# Patient Record
Sex: Male | Born: 1940 | Race: White | Hispanic: No | Marital: Married | Smoking: Former smoker
Health system: Southern US, Community
[De-identification: ages and names within clinical notes are randomized; demographics above are authoritative.]

## PROBLEM LIST (undated history)

## (undated) DIAGNOSIS — K219 Gastro-esophageal reflux disease without esophagitis: Secondary | ICD-10-CM

## (undated) DIAGNOSIS — R011 Cardiac murmur, unspecified: Secondary | ICD-10-CM

## (undated) DIAGNOSIS — I679 Cerebrovascular disease, unspecified: Secondary | ICD-10-CM

## (undated) DIAGNOSIS — E785 Hyperlipidemia, unspecified: Secondary | ICD-10-CM

## (undated) DIAGNOSIS — I10 Essential (primary) hypertension: Secondary | ICD-10-CM

## (undated) DIAGNOSIS — M1990 Unspecified osteoarthritis, unspecified site: Secondary | ICD-10-CM

## (undated) DIAGNOSIS — I714 Abdominal aortic aneurysm, without rupture, unspecified: Secondary | ICD-10-CM

## (undated) DIAGNOSIS — C801 Malignant (primary) neoplasm, unspecified: Secondary | ICD-10-CM

## (undated) DIAGNOSIS — N183 Chronic kidney disease, stage 3 unspecified: Secondary | ICD-10-CM

## (undated) DIAGNOSIS — I509 Heart failure, unspecified: Secondary | ICD-10-CM

## (undated) DIAGNOSIS — D649 Anemia, unspecified: Secondary | ICD-10-CM

## (undated) DIAGNOSIS — I6381 Other cerebral infarction due to occlusion or stenosis of small artery: Secondary | ICD-10-CM

## (undated) HISTORY — PX: EYE SURGERY: SHX253

## (undated) HISTORY — DX: Abdominal aortic aneurysm, without rupture, unspecified: I71.40

## (undated) HISTORY — DX: Abdominal aortic aneurysm, without rupture: I71.4

## (undated) HISTORY — PX: AV FISTULA PLACEMENT: SHX1204

## (undated) HISTORY — PX: VASECTOMY: SHX75

## (undated) HISTORY — DX: Malignant (primary) neoplasm, unspecified: C80.1

## (undated) HISTORY — DX: Anemia, unspecified: D64.9

---

## 2005-06-13 ENCOUNTER — Emergency Department (HOSPITAL_COMMUNITY): Admission: EM | Admit: 2005-06-13 | Discharge: 2005-06-14 | Payer: Self-pay | Admitting: Emergency Medicine

## 2011-11-09 ENCOUNTER — Encounter (HOSPITAL_COMMUNITY): Payer: Self-pay

## 2011-11-09 ENCOUNTER — Encounter (HOSPITAL_COMMUNITY)
Admission: RE | Admit: 2011-11-09 | Discharge: 2011-11-09 | Disposition: A | Discharge status: Home / Self Care | Payer: Medicare Other | Source: Ambulatory Visit | Attending: Ophthalmology | Admitting: Ophthalmology

## 2011-11-09 ENCOUNTER — Other Ambulatory Visit: Payer: Self-pay

## 2011-11-09 ENCOUNTER — Encounter (HOSPITAL_COMMUNITY): Payer: Self-pay | Admitting: Pharmacy Technician

## 2011-11-09 HISTORY — DX: Hyperlipidemia, unspecified: E78.5

## 2011-11-09 HISTORY — DX: Gastro-esophageal reflux disease without esophagitis: K21.9

## 2011-11-09 NOTE — Patient Instructions (Addendum)
[address] DEMETRIE POTOSKY  11/09/2011   Your procedure is scheduled on:  11/17/11  Report to [facility] at 08:30 AM.  Call this number if you have problems the morning of surgery: [phone]   Remember:   Do not eat food:After Midnight.  May have clear liquids:until Midnight .  Clear liquids include soda, tea, black coffee, apple or grape juice, broth.  Take these medicines the morning of surgery with A SIP OF WATER: Losartan,    Do not wear jewelry, make-up or nail polish.  Do not wear lotions, powders, or perfumes. You may wear deodorant.  Do not shave 48 hours prior to surgery.  Do not bring valuables to the hospital.  Contacts, dentures or bridgework may not be worn into surgery.  Leave suitcase in the car. After surgery it may be brought to your room.  For patients admitted to the hospital, checkout time is 11:00 AM the day of discharge.   Patients discharged the day of surgery will not be allowed to drive home.  Name and phone number of your driver:   Special Instructions: N/A   Please read over the following fact sheets that you were given: Anesthesia Post-op Instructions    Cataract A cataract is a clouding of the lens of the eye. It is most often related to aging. A cataract is not a "film" over the surface of the eye. The lens is inside the eye and changes size of the pupil. The lens can enlarge to let more light enter the eye in dark environments and contract the size of the pupil to let in bright light. The lens is the part of the eye that helps focus light on the retina. The retina is the eye's light-sensitive layer. It is in the back of the eye that sends visual signals to the brain. In a normal eye, light passes through the lens and gets focused on the retina. To help produce a sharp image, the lens must remain clear. When a lens becomes cloudy, vision is compromised by the degree and nature of the clouding. Certain cataracts make people more near-sighted as they develop, others  increase glare, and all reduce vision to some degree or another. A cataract that is so dense that it becomes milky Edia Pursifull and a Tinita Brooker opacity can be seen through the pupil. When the [place] Berkey color is seen, it is called a "mature" or "hyper-mature cataract." Such cataracts cause total blindness in the affected eye. The cataract must be removed to prevent damage to the eye itself. Some types of cataracts can cause a secondary disease of the eye, such as certain types of glaucoma. In the early stages, better lighting and eyeglasses may lessen vision problems caused by cataracts. At a certain point, surgery may be needed to improve vision. CAUSES   Aging. However, cataracts may occur at any age, even in newborns.   Certain drugs.   Trauma to the eye.   Certain diseases (such as diabetes).   Inherited or acquired medical syndromes.  SYMPTOMS   Gradual, progressive drop in vision in the affected eye. Cataracts may develop at different rates in each eye. Cataracts may even be in just one eye with the other unaffected.   Cataracts due to trauma may develop quickly, sometimes over a matter or days or even hours. The result is severe and rapid visual loss.  DIAGNOSIS  To detect a cataract, an eye doctor examines the lens. A well developed cataract can be diagnosed without dilating the  pupil. Early cataracts and others of a specific nature are best diagnosed with an exam of the eyes with the pupils dilated by drops. TREATMENT   For an early cataract, vision may improve by using different eyeglasses or stronger lighting.   If the above measures do not help, surgery is the only effective treatment. This treatment removes the cloudy lens and replaces it with a substitute lens (Intraocular lens, or IOL). Newly developed IOL technology allows the implanted lens to improve vision both at a distance and up close. Discuss with your eye surgeon about the possibility of still needing glasses. Also discuss how visual  coordination between both eyes will be affected.  A cataract needs to be removed only when vision loss interferes with your everyday activities such as driving, reading or watching TV. You and your eye doctor can make that decision together. In most cases, waiting until you are ready to have cataract surgery will not harm your eye. If you have cataracts in both eyes, only one should be removed at a time. This allows the operated eye to heal and be out of danger from serious problems (such as infection or poor wound healing) before having the other eye undergo surgery.  Sometimes, a cataract should be removed even if it does not cause problems with your vision. For example, a cataract should be removed if it prevents examination or treatment of another eye problem. Just as you cannot see out of the affected eye well, your doctor cannot see into your eye well through a cataract. The vast majority of people who have cataract surgery have better vision afterward. CATARACT REMOVAL There are two primary ways to remove a cataract. Your doctor can explain the differences and help determine which is best for you:  Phacoemulsification (small incision cataract surgery). This involves making a small cut (incision) on the edge of the clear, dome-shaped surface that covers the front of the eye (the cornea). An injection behind the eye or eye drops are given to make this a painless procedure. The doctor then inserts a tiny probe into the eye. This device emits ultrasound waves that soften and break up the cloudy center of the lens so it can be removed by suction. Most cataract surgery is done this way. The cuts are usually so small and performed in such a manner that often no sutures are needed to keep it closed.   Extracapsular surgery. Your doctor makes a slightly longer incision on the side of the cornea. The doctor removes the hard center of the lens. The remainder of the lens is then removed by suction. In some  cases, extremely fine sutures are needed which the doctor may, or may not remove in the office after the surgery.  When an IOL is implanted, it needs no care. It becomes a permanent part of your eye and cannot be seen or felt.  Some people cannot have an IOL. They may have problems during surgery, or maybe they have another eye disease. For these people, a soft contact lens may be suggested. If an IOL or contact lens cannot be used, very powerful and thick glasses are required after surgery. Since vision is very different through such thick glasses, it is important to have your doctor discuss the impact on your vision after any cataract surgery where there is no plan to implant an IOL. The normal lens of the eye is covered by a clear capsule. Both phacoemulsification and extracapsular surgery require that the back surface of  this lens capsule be left in place. This helps support IOLs and prevents the IOL from dislocating and falling back into the deeper interior of the eye. Right after surgery, and often permanently this "posterior capsule" remains clear. In some cases however, it can become cloudy, presenting the same type of visual compromise that the original cataract did since light is again obstructed as it passes through the clear IOL. This condition is often referred to as an "after-cataract." Fortunately, after-cataracts are easily treated using a painless and very fast laser treatment that is performed without anesthesia or incisions. It is done in a matter of minutes in an outpatient environment. Visual improvement is often immediate.  HOME CARE INSTRUCTIONS   Your surgeon will discuss pre and post operative care with you prior to surgery. The majority of people are able to do almost all normal activities right away. Although, it is often advised to avoid strenuous activity for a period of time.   Postoperative drops and careful avoidance of infection will be needed. Many surgeons suggest the use  of a protective shield during the first few days after surgery.   There is a very small incidence of complication from modern cataract surgery, but it can happen. Infection that spreads to the inside of the eye (endophthalmitis) can result in total visual loss and even loss of the eye itself. In extremely rare instances, the inflammation of endophthalmitis can spread to both eyes (sympathetic ophthalmia). Appropriate post-operative care under the close observation of your surgeon is essential to a successful outcome.  SEEK IMMEDIATE MEDICAL CARE IF:   You have any sudden drop of vision in the operated eye.   You have pain in the operated eye.   You see a large number of floating dots in the field of vision in the operated eye.   You see flashing lights, or if a portion of your side vision in any direction appears black (like a curtain being drawn into your field of vision) in the operated eye.  Document Released: 09/26/2005 Document Revised: 06/08/2011 Document Reviewed: 11/12/2007 [facility] Patient Information 2012 [place].   PATIENT INSTRUCTIONS POST-ANESTHESIA  IMMEDIATELY FOLLOWING SURGERY:  Do not drive or operate machinery for the first twenty four hours after surgery.  Do not make any important decisions for twenty four hours after surgery or while taking narcotic pain medications or sedatives.  If you develop intractable nausea and vomiting or a severe headache please notify your doctor immediately.  FOLLOW-UP:  Please make an appointment with your surgeon as instructed. You do not need to follow up with anesthesia unless specifically instructed to do so.  WOUND CARE INSTRUCTIONS (if applicable):  Keep a dry clean dressing on the anesthesia/puncture wound site if there is drainage.  Once the wound has quit draining you may leave it open to air.  Generally you should leave the bandage intact for twenty four hours unless there is drainage.  If the epidural site drains for more  than 36-48 hours please call the anesthesia department.  QUESTIONS?:  Please feel free to call your physician or the hospital operator if you have any questions, and they will be happy to assist you.     [place] [phone]

## 2011-11-16 MED ORDER — NEOMYCIN-POLYMYXIN-DEXAMETH 3.5-10000-0.1 OP OINT
TOPICAL_OINTMENT | OPHTHALMIC | Status: AC
  Filled 2011-11-16: qty 3.5

## 2011-11-16 MED ORDER — TETRACAINE HCL 0.5 % OP SOLN
OPHTHALMIC | Status: AC
  Administered 2011-11-17: 1 [drp] via OPHTHALMIC
  Filled 2011-11-16: qty 2

## 2011-11-16 MED ORDER — CYCLOPENTOLATE-PHENYLEPHRINE 0.2-1 % OP SOLN
OPHTHALMIC | Status: AC
  Administered 2011-11-17: 1 [drp] via OPHTHALMIC
  Filled 2011-11-16: qty 2

## 2011-11-16 MED ORDER — LIDOCAINE HCL 3.5 % OP GEL
OPHTHALMIC | Status: AC
  Administered 2011-11-17: 1 via OPHTHALMIC
  Filled 2011-11-16: qty 5

## 2011-11-16 MED ORDER — LIDOCAINE HCL (PF) 1 % IJ SOLN
INTRAMUSCULAR | Status: AC
  Filled 2011-11-16: qty 2

## 2011-11-17 ENCOUNTER — Encounter (HOSPITAL_COMMUNITY)
Admission: RE | Disposition: A | Discharge status: Home / Self Care | Payer: Self-pay | Source: Ambulatory Visit | Attending: Ophthalmology

## 2011-11-17 ENCOUNTER — Ambulatory Visit (HOSPITAL_COMMUNITY)
Admission: RE | Admit: 2011-11-17 | Discharge: 2011-11-17 | Disposition: A | Discharge status: Home / Self Care | Payer: Medicare Other | Source: Ambulatory Visit | Attending: Ophthalmology | Admitting: Ophthalmology

## 2011-11-17 ENCOUNTER — Encounter (HOSPITAL_COMMUNITY): Payer: Self-pay | Admitting: Anesthesiology

## 2011-11-17 ENCOUNTER — Encounter (HOSPITAL_COMMUNITY): Payer: Self-pay | Admitting: *Deleted

## 2011-11-17 DIAGNOSIS — H2510 Age-related nuclear cataract, unspecified eye: Secondary | ICD-10-CM | POA: Insufficient documentation

## 2011-11-17 DIAGNOSIS — Z01812 Encounter for preprocedural laboratory examination: Secondary | ICD-10-CM | POA: Insufficient documentation

## 2011-11-17 DIAGNOSIS — Z79899 Other long term (current) drug therapy: Secondary | ICD-10-CM | POA: Insufficient documentation

## 2011-11-17 DIAGNOSIS — I10 Essential (primary) hypertension: Secondary | ICD-10-CM | POA: Insufficient documentation

## 2011-11-17 DIAGNOSIS — Z01810 Encounter for preprocedural cardiovascular examination: Secondary | ICD-10-CM | POA: Insufficient documentation

## 2011-11-17 HISTORY — PX: CATARACT EXTRACTION W/PHACO: SHX586

## 2011-11-17 SURGERY — PHACOEMULSIFICATION, CATARACT, WITH IOL INSERTION
Anesthesia: Monitor Anesthesia Care | Site: Eye | Laterality: Right | Wound class: Clean

## 2011-11-17 MED ORDER — EPINEPHRINE HCL 1 MG/ML IJ SOLN
INTRAMUSCULAR | Status: AC
  Filled 2011-11-17: qty 1

## 2011-11-17 MED ORDER — PHENYLEPHRINE HCL 2.5 % OP SOLN
1.0000 [drp] | OPHTHALMIC | Status: AC
  Administered 2011-11-17 (×3): 1 [drp] via OPHTHALMIC

## 2011-11-17 MED ORDER — LACTATED RINGERS IV SOLN
INTRAVENOUS | Status: DC
  Administered 2011-11-17: 1000 mL via INTRAVENOUS

## 2011-11-17 MED ORDER — NEOMYCIN-POLYMYXIN-DEXAMETH 0.1 % OP OINT
TOPICAL_OINTMENT | OPHTHALMIC | Status: DC | PRN
  Administered 2011-11-17: 1 via OPHTHALMIC

## 2011-11-17 MED ORDER — MIDAZOLAM HCL 2 MG/2ML IJ SOLN
INTRAMUSCULAR | Status: AC
  Administered 2011-11-17: 2 mg via INTRAVENOUS
  Filled 2011-11-17: qty 2

## 2011-11-17 MED ORDER — PROVISC 10 MG/ML IO SOLN
INTRAOCULAR | Status: DC | PRN
  Administered 2011-11-17: 8.5 mg via INTRAOCULAR

## 2011-11-17 MED ORDER — BSS IO SOLN
INTRAOCULAR | Status: DC | PRN
  Administered 2011-11-17: 15 mL via INTRAOCULAR

## 2011-11-17 MED ORDER — EPINEPHRINE HCL 1 MG/ML IJ SOLN
INTRAOCULAR | Status: DC | PRN
  Administered 2011-11-17: 10:00:00

## 2011-11-17 MED ORDER — EPINEPHRINE HCL 1 MG/ML IJ SOLN
INTRAMUSCULAR | Status: DC | PRN
  Administered 2011-11-17: 10:00:00 via OPHTHALMIC

## 2011-11-17 MED ORDER — PHENYLEPHRINE HCL 2.5 % OP SOLN
OPHTHALMIC | Status: AC
  Administered 2011-11-17: 1 [drp] via OPHTHALMIC
  Filled 2011-11-17: qty 2

## 2011-11-17 MED ORDER — CYCLOPENTOLATE-PHENYLEPHRINE 0.2-1 % OP SOLN
1.0000 [drp] | OPHTHALMIC | Status: AC
  Administered 2011-11-17 (×3): 1 [drp] via OPHTHALMIC

## 2011-11-17 MED ORDER — TETRACAINE HCL 0.5 % OP SOLN
1.0000 [drp] | OPHTHALMIC | Status: AC
  Administered 2011-11-17 (×3): 1 [drp] via OPHTHALMIC

## 2011-11-17 MED ORDER — LIDOCAINE HCL 3.5 % OP GEL
1.0000 "application " | Freq: Once | OPHTHALMIC | Status: AC
  Administered 2011-11-17: 1 via OPHTHALMIC

## 2011-11-17 MED ORDER — MIDAZOLAM HCL 2 MG/2ML IJ SOLN
1.0000 mg | INTRAMUSCULAR | Status: DC | PRN
  Administered 2011-11-17: 2 mg via INTRAVENOUS

## 2011-11-17 MED ORDER — POVIDONE-IODINE 5 % OP SOLN
OPHTHALMIC | Status: DC | PRN
  Administered 2011-11-17: 1 via OPHTHALMIC

## 2011-11-17 MED ORDER — LIDOCAINE 3.5 % OP GEL OPTIME - NO CHARGE
OPHTHALMIC | Status: DC | PRN
  Administered 2011-11-17: 1 [drp] via OPHTHALMIC

## 2011-11-17 SURGICAL SUPPLY — 32 items

## 2011-11-17 NOTE — Anesthesia Preprocedure Evaluation (Signed)
Anesthesia Evaluation  Patient identified by MRN, date of birth, ID band Patient awake    Reviewed: Allergy & Precautions, H&P , NPO status , Patient's Chart, lab work & pertinent test results  Airway Mallampati: II      Dental  (+) Teeth Intact and Implants   Pulmonary neg pulmonary ROS,  clear to auscultation        Cardiovascular hypertension, Pt. on medications Regular Normal    Neuro/Psych    GI/Hepatic GERD-  Medicated and Controlled,  Endo/Other    Renal/GU      Musculoskeletal   Abdominal   Peds  Hematology   Anesthesia Other Findings   Reproductive/Obstetrics                           Anesthesia Physical Anesthesia Plan  ASA: II  Anesthesia Plan: MAC   Post-op Pain Management:    Induction: Intravenous  Airway Management Planned: Nasal Cannula  Additional Equipment:   Intra-op Plan:   Post-operative Plan:   Informed Consent: I have reviewed the patients History and Physical, chart, labs and discussed the procedure including the risks, benefits and alternatives for the proposed anesthesia with the patient or authorized representative who has indicated his/her understanding and acceptance.     Plan Discussed with:   Anesthesia Plan Comments:         Anesthesia Quick Evaluation

## 2011-11-17 NOTE — Anesthesia Postprocedure Evaluation (Signed)
  Anesthesia Post-op Note  Patient: Alan Hawkins  Procedure(s) Performed:  CATARACT EXTRACTION PHACO AND INTRAOCULAR LENS PLACEMENT (IOC) - CDE=16.17  Patient Location:  Short Stay  Anesthesia Type: MAC  Level of Consciousness: awake  Airway and Oxygen Therapy: Patient Spontanous Breathing  Post-op Pain: none  Post-op Assessment: Post-op Vital signs reviewed, Patient's Cardiovascular Status Stable, Respiratory Function Stable, Patent Airway, No signs of Nausea or vomiting and Pain level controlled  Post-op Vital Signs: Reviewed and stable  Complications: No apparent anesthesia complications

## 2011-11-17 NOTE — Transfer of Care (Signed)
Immediate Anesthesia Transfer of Care Note  Patient: Alan Hawkins  Procedure(s) Performed:  CATARACT EXTRACTION PHACO AND INTRAOCULAR LENS PLACEMENT (IOC) - CDE=16.17  Patient Location: Shortstay  Anesthesia Type: MAC  Level of Consciousness: awake  Airway & Oxygen Therapy: Patient Spontanous Breathing   Post-op Assessment: Report given to PACU RN, Post -op Vital signs reviewed and stable and Patient moving all extremities  Post vital signs: Reviewed and stable  Complications: No apparent anesthesia complications

## 2011-11-17 NOTE — H&P (Signed)
I have reviewed the H&P, the patient was re-examined, and I have identified no interval changes in medical condition and plan of care since the history and physical of record  

## 2011-11-17 NOTE — Anesthesia Procedure Notes (Signed)
Procedure Name: MAC Date/Time: 11/17/2011 9:50 AM Performed by: Drucie Opitz Pre-anesthesia Checklist: Patient identified, Patient being monitored, Emergency Drugs available, Timeout performed and Suction available Oxygen Delivery Method: Nasal Cannula

## 2011-11-17 NOTE — Brief Op Note (Signed)
Pre-Op Dx: Cataract OD Post-Op Dx: Cataract OD Surgeon: Tonny Branch Anesthesia: Topical with MAC Implant: B&L enVista Blood Loss: None Specimen: None Complications: None

## 2011-11-18 NOTE — Op Note (Signed)
NAMEMELBERN, CEFALO NO.:  [id number]  MEDICAL RECORD NO.:  [id number]  LOCATION:  [facility]                          FACILITY:  [facility]  PHYSICIAN:  Richardo Hanks, MD       DATE OF BIRTH:  1941-03-12  DATE OF PROCEDURE:  11/17/2011 DATE OF DISCHARGE:  11/17/2011                              OPERATIVE REPORT   PREOPERATIVE DIAGNOSIS:  Nuclear cataract, right eye, diagnosis code 366.16.  POSTOPERATIVE DIAGNOSIS:  Nuclear cataract, right eye, diagnosis code 366.16.  OPERATION PERFORMED:  Phacoemulsification with posterior chamber intraocular lens implantation, right eye.  SURGEON:  Franky Macho. Paitlyn Mcclatchey, MD  ANESTHESIA:  General endotracheal anesthesia.  OPERATIVE SUMMARY:  In the preoperative area, dilating drops were placed into the right eye.  The patient was then brought into the operating room where he was placed under general anesthesia.  The eye was then prepped and draped.  Beginning with a 75 blade, a paracentesis port was made at the surgeon's 2 o'clock position.  The anterior chamber was then filled with a 1% nonpreserved lidocaine solution with epinephrine.  This was followed by Viscoat to deepen the chamber.  A small fornix-based peritomy was performed superiorly.  Next, a single iris hook was placed through the limbus superiorly.  A 2.4-mm keratome blade was then used to make a clear corneal incision over the iris hook.  A bent cystotome needle and Utrata forceps were used to create a continuous tear capsulotomy.  Hydrodissection was performed using balanced salt solution on a fine cannula.  The lens nucleus was then removed using phacoemulsification in a quadrant cracking technique.  The cortical material was then removed with irrigation and aspiration.  The capsular bag and anterior chamber were refilled with Provisc.  The wound was widened to approximately 3 mm and a posterior chamber intraocular lens was placed into the capsular bag without difficulty  using an [employer] lens injecting system.  A single 10-0 nylon suture was then used to close the incision as well as stromal hydration.  The Provisc was removed from the anterior chamber and capsular bag with irrigation and aspiration.  At this point, the wounds were tested for leak, which were negative.  The anterior chamber remained deep and stable.  The patient tolerated the procedure well.  There were no operative complications, and he awoke from general anesthesia without problem.  No surgical specimens.  Prosthetic device used is a [employer] enVista posterior chamber lens, model MX60, power of 23.0, serial number is [id number].          ______________________________ Richardo Hanks, MD     KEH/MEDQ  D:  11/17/2011  T:  11/18/2011  Job:  [id number]

## 2011-11-21 ENCOUNTER — Encounter (HOSPITAL_COMMUNITY): Payer: Self-pay | Admitting: Ophthalmology

## 2011-11-21 MED ORDER — ONDANSETRON HCL 4 MG/2ML IJ SOLN: 4.0000 mg | Freq: Once | INTRAMUSCULAR | Status: AC | PRN

## 2011-11-21 MED ORDER — FENTANYL CITRATE 0.05 MG/ML IJ SOLN: 25.0000 ug | INTRAMUSCULAR | Status: AC | PRN

## 2011-12-01 ENCOUNTER — Encounter (HOSPITAL_COMMUNITY): Payer: Self-pay | Admitting: *Deleted

## 2011-12-01 ENCOUNTER — Encounter (HOSPITAL_COMMUNITY)
Admission: RE | Admit: 2011-12-01 | Discharge: 2011-12-01 | Payer: Medicare Other | Source: Ambulatory Visit | Admitting: Ophthalmology

## 2011-12-01 NOTE — Patient Instructions (Signed)
Alan Hawkins  12/01/2011   Your procedure is scheduled on:  2.25.13  Report to [facility] at 1230 PM.  Call this number if you have problems the morning of surgery: [phone]   Remember:   Do not eat food:After Midnight.  May have clear liquids:until Midnight .  Clear liquids include soda, tea, black coffee, apple or grape juice, broth.  Take these medicines the morning of surgery with A SIP OF WATER: losartan, diazepam, benicar   Do not wear jewelry, make-up or nail polish.  Do not wear lotions, powders, or perfumes. You may wear deodorant.  Do not shave 48 hours prior to surgery.  Do not bring valuables to the hospital.  Contacts, dentures or bridgework may not be worn into surgery.  Leave suitcase in the car. After surgery it may be brought to your room.  For patients admitted to the hospital, checkout time is 11:00 AM the day of discharge.   Patients discharged the day of surgery will not be allowed to drive home.  Name and phone number of your driver: family  Special Instructions: N/A   Please read over the following fact sheets that you were given: Pain Booklet, Anesthesia Post-op Instructions and Care and Recovery After Surgery  PATIENT INSTRUCTIONS POST-ANESTHESIA  IMMEDIATELY FOLLOWING SURGERY:  Do not drive or operate machinery for the first twenty four hours after surgery.  Do not make any important decisions for twenty four hours after surgery or while taking narcotic pain medications or sedatives.  If you develop intractable nausea and vomiting or a severe headache please notify your doctor immediately.  FOLLOW-UP:  Please make an appointment with your surgeon as instructed. You do not need to follow up with anesthesia unless specifically instructed to do so.  WOUND CARE INSTRUCTIONS (if applicable):  Keep a dry clean dressing on the anesthesia/puncture wound site if there is drainage.  Once the wound has quit draining you may leave it open to air.  Generally you should  leave the bandage intact for twenty four hours unless there is drainage.  If the epidural site drains for more than 36-48 hours please call the anesthesia department.  QUESTIONS?:  Please feel free to call your physician or the hospital operator if you have any questions, and they will be happy to assist you.     [place] [phone]     The vast majority of people who have cataract surgery have better vision afterward.Cataract A cataract is a clouding of the lens of the eye. It is most often related to aging. A cataract is not a "film" over the surface of the eye. The lens is inside the eye and changes size of the pupil. The lens can enlarge to let more light enter the eye in dark environments and contract the size of the pupil to let in bright light. The lens is the part of the eye that helps focus light on the retina. The retina is the eye's light-sensitive layer. It is in the back of the eye that sends visual signals to the brain. In a normal eye, light passes through the lens and gets focused on the retina. To help produce a sharp image, the lens must remain clear. When a lens becomes cloudy, vision is compromised by the degree and nature of the clouding. Certain cataracts make people more near-sighted as they develop, others increase glare, and all reduce vision to some degree or another. A cataract that  is so dense that it becomes milky white and a white opacity can be seen through the pupil. When the white color is seen, it is called a "mature" or "hyper-mature cataract." Such cataracts cause total blindness in the affected eye. The cataract must be removed to prevent damage to the eye itself. Some types of cataracts can cause a secondary disease of the eye, such as certain types of glaucoma. In the early stages, better lighting and eyeglasses may lessen vision problems caused by cataracts. At a certain point, surgery may be needed  to improve vision. CAUSES   Aging. However, cataracts may occur at any age, even in newborns.   Certain drugs.   Trauma to the eye.   Certain diseases (such as diabetes).   Inherited or acquired medical syndromes.  SYMPTOMS   Gradual, progressive drop in vision in the affected eye. Cataracts may develop at different rates in each eye. Cataracts may even be in just one eye with the other unaffected.   Cataracts due to trauma may develop quickly, sometimes over a matter or days or even hours. The result is severe and rapid visual loss.  DIAGNOSIS  To detect a cataract, an eye doctor examines the lens. A well developed cataract can be diagnosed without dilating the pupil. Early cataracts and others of a specific nature are best diagnosed with an exam of the eyes with the pupils dilated by drops. TREATMENT   For an early cataract, vision may improve by using different eyeglasses or stronger lighting.   If the above measures do not help, surgery is the only effective treatment. This treatment removes the cloudy lens and replaces it with a substitute lens (Intraocular lens, or IOL). Newly developed IOL technology allows the implanted lens to improve vision both at a distance and up close. Discuss with your eye surgeon about the possibility of still needing glasses. Also discuss how visual coordination between both eyes will be affected.  A cataract needs to be removed only when vision loss interferes with your everyday activities such as driving, reading or watching TV. You and your eye doctor can make that decision together. In most cases, waiting until you are ready to have cataract surgery will not harm your eye. If you have cataracts in both eyes, only one should be removed at a time. This allows the operated eye to heal and be out of danger from serious problems (such as infection or poor wound healing) before having the other eye undergo surgery.  Sometimes, a cataract should be removed even  if it does not cause problems with your vision. For example, a cataract should be removed if it prevents examination or treatment of another eye problem. Just as you cannot see out of the affected eye well, your doctor cannot see into your eye20  CATARACT REMOVAL There are two primary ways to remove a cataract. Your doctor can explain the differences and help determine which is best for you:  Phacoemulsification (small incision cataract surgery). This involves making a small cut (incision) on the edge of the clear, dome-shaped surface that covers the front of the eye (the cornea). An injection behind the eye or eye drops are given to make this a painless procedure. The doctor then inserts a tiny probe into the eye. This device emits ultrasound waves that soften and break up the cloudy center of the lens so it can be removed by suction. Most cataract surgery is done this way. The cuts are usually so small and performed  in such a manner that often no sutures are needed to keep it closed.   Extracapsular surgery. Your doctor makes a slightly longer incision on the side of the cornea. The doctor removes the hard center of the lens. The remainder of the lens is then removed by suction. In some cases, extremely fine sutures are needed which the doctor may, or may not remove in the office after the surgery.  When an IOL is implanted, it needs no care. It becomes a permanent part of your eye and cannot be seen or felt.  Some people cannot have an IOL. They may have problems during surgery, or maybe they have another eye disease. For these people, a soft contact lens may be suggested. If an IOL or contact lens cannot be used, very powerful and thick glasses are required after surgery. Since vision is very different through such thick glasses, it is important to have your doctor discuss the impact on your vision after any cataract surgery where there is no plan to implant an IOL. The normal lens of the eye is covered  by a clear capsule. Both phacoemulsification and extracapsular surgery require that the back surface of this lens capsule be left in place. This helps support IOLs and prevents the IOL from dislocating and falling back into the deeper interior of the eye. Right after surgery, and often permanently this "posterior capsule" remains clear. In some cases however, it can become cloudy, presenting the same type of visual compromise that the original cataract did since light is again obstructed as it passes through the clear IOL. This condition is often referred to as an "after-cataract." Fortunately, after-cataracts are easily treated using a painless and very fast laser treatment that is performed without anesthesia or incisions. It is done in a matter of minutes in an outpatient environment. Visual improvement is often immediate.  HOME CARE INSTRUCTIONS   Your surgeon will discuss pre and post operative care with you prior to surgery. The majority of people are able to do almost all normal activities right away. Although, it is often advised to avoid strenuous activity for a period of time.   Postoperative drops and careful avoidance of infection will be needed. Many surgeons suggest the use of a protective shield during the first few days after surgery.   There is a very small incidence of complication from modern cataract surgery, but it can happen. Infection that spreads to the inside of the eye (endophthalmitis) can result in total visual loss and even loss of the eye itself. In extremely rare instances, the inflammation of endophthalmitis can spread to both eyes (sympathetic ophthalmia). Appropriate post-operative care under the close observation of your surgeon is essential to a successful outcome.  SEEK IMMEDIATE MEDICAL CARE IF:   You have any sudden drop of vision in the operated eye.   You have pain in the operated eye.   You see a large number of floating dots in the field of vision in the  operated eye.   You see flashing lights, or if a portion of your side vision in any direction appears black (like a curtain being drawn into your field of vision) in the operated eye.  Document Released: 09/26/2005 Document Revised: 06/08/2011 Document Reviewed: 11/12/2007 [facility] Patient Information 2012 [place]. Alan Hawkins  12/01/2011

## 2011-12-02 MED ORDER — TETRACAINE HCL 0.5 % OP SOLN
OPHTHALMIC | Status: AC
  Filled 2011-12-02: qty 2

## 2011-12-02 MED ORDER — CYCLOPENTOLATE-PHENYLEPHRINE 0.2-1 % OP SOLN
OPHTHALMIC | Status: AC
  Filled 2011-12-02: qty 2

## 2011-12-02 MED ORDER — NEOMYCIN-POLYMYXIN-DEXAMETH 3.5-10000-0.1 OP OINT
TOPICAL_OINTMENT | OPHTHALMIC | Status: AC
  Filled 2011-12-02: qty 3.5

## 2011-12-02 MED ORDER — LIDOCAINE HCL 3.5 % OP GEL
OPHTHALMIC | Status: AC
  Filled 2011-12-02: qty 5

## 2011-12-05 ENCOUNTER — Ambulatory Visit (HOSPITAL_COMMUNITY)
Admission: RE | Admit: 2011-12-05 | Discharge: 2011-12-05 | Disposition: A | Discharge status: Home / Self Care | Payer: Medicare Other | Source: Ambulatory Visit | Attending: Ophthalmology | Admitting: Ophthalmology

## 2011-12-05 ENCOUNTER — Ambulatory Visit (HOSPITAL_COMMUNITY): Payer: Medicare Other | Admitting: Anesthesiology

## 2011-12-05 ENCOUNTER — Encounter (HOSPITAL_COMMUNITY)
Admission: RE | Disposition: A | Discharge status: Home / Self Care | Payer: Self-pay | Source: Ambulatory Visit | Attending: Ophthalmology

## 2011-12-05 ENCOUNTER — Encounter (HOSPITAL_COMMUNITY): Payer: Self-pay | Admitting: Anesthesiology

## 2011-12-05 ENCOUNTER — Encounter (HOSPITAL_COMMUNITY): Payer: Self-pay | Admitting: *Deleted

## 2011-12-05 DIAGNOSIS — H2510 Age-related nuclear cataract, unspecified eye: Secondary | ICD-10-CM | POA: Insufficient documentation

## 2011-12-05 DIAGNOSIS — Z79899 Other long term (current) drug therapy: Secondary | ICD-10-CM | POA: Insufficient documentation

## 2011-12-05 DIAGNOSIS — I10 Essential (primary) hypertension: Secondary | ICD-10-CM | POA: Insufficient documentation

## 2011-12-05 HISTORY — PX: CATARACT EXTRACTION W/PHACO: SHX586

## 2011-12-05 SURGERY — PHACOEMULSIFICATION, CATARACT, WITH IOL INSERTION
Anesthesia: Monitor Anesthesia Care | Site: Eye | Laterality: Left | Wound class: Clean

## 2011-12-05 MED ORDER — PHENYLEPHRINE HCL 2.5 % OP SOLN
OPHTHALMIC | Status: AC
  Administered 2011-12-05: 1 [drp] via OPHTHALMIC
  Filled 2011-12-05: qty 2

## 2011-12-05 MED ORDER — POVIDONE-IODINE 5 % OP SOLN
OPHTHALMIC | Status: DC | PRN
  Administered 2011-12-05: 1 via OPHTHALMIC

## 2011-12-05 MED ORDER — PHENYLEPHRINE HCL 2.5 % OP SOLN
1.0000 [drp] | OPHTHALMIC | Status: AC
  Administered 2011-12-05 (×3): 1 [drp] via OPHTHALMIC

## 2011-12-05 MED ORDER — PROVISC 10 MG/ML IO SOLN
INTRAOCULAR | Status: DC | PRN
  Administered 2011-12-05: 8.5 mg via INTRAOCULAR

## 2011-12-05 MED ORDER — LIDOCAINE HCL (PF) 1 % IJ SOLN
INTRAMUSCULAR | Status: AC
  Filled 2011-12-05: qty 2

## 2011-12-05 MED ORDER — TETRACAINE HCL 0.5 % OP SOLN
1.0000 [drp] | OPHTHALMIC | Status: AC
  Administered 2011-12-05 (×3): 1 [drp] via OPHTHALMIC

## 2011-12-05 MED ORDER — EPINEPHRINE HCL 1 MG/ML IJ SOLN
INTRAOCULAR | Status: DC | PRN
  Administered 2011-12-05: 14:00:00

## 2011-12-05 MED ORDER — LACTATED RINGERS IV SOLN
INTRAVENOUS | Status: DC
  Administered 2011-12-05: 14:00:00 via INTRAVENOUS

## 2011-12-05 MED ORDER — CYCLOPENTOLATE-PHENYLEPHRINE 0.2-1 % OP SOLN
1.0000 [drp] | OPHTHALMIC | Status: AC
  Administered 2011-12-05 (×3): 1 [drp] via OPHTHALMIC

## 2011-12-05 MED ORDER — MIDAZOLAM HCL 2 MG/2ML IJ SOLN
INTRAMUSCULAR | Status: AC
  Filled 2011-12-05: qty 2

## 2011-12-05 MED ORDER — LIDOCAINE HCL (PF) 1 % IJ SOLN: INTRAMUSCULAR | Status: DC | PRN

## 2011-12-05 MED ORDER — NEOMYCIN-POLYMYXIN-DEXAMETH 3.5-10000-0.1 OP OINT
TOPICAL_OINTMENT | OPHTHALMIC | Status: AC
  Filled 2011-12-05: qty 3.5

## 2011-12-05 MED ORDER — NEOMYCIN-POLYMYXIN-DEXAMETH 0.1 % OP OINT
TOPICAL_OINTMENT | OPHTHALMIC | Status: DC | PRN
  Administered 2011-12-05: 1 via OPHTHALMIC

## 2011-12-05 MED ORDER — MIDAZOLAM HCL 2 MG/2ML IJ SOLN
1.0000 mg | INTRAMUSCULAR | Status: DC | PRN
  Administered 2011-12-05: 2 mg via INTRAVENOUS

## 2011-12-05 MED ORDER — LIDOCAINE HCL (PF) 1 % IJ SOLN
INTRAOCULAR | Status: DC | PRN
  Administered 2011-12-05: 14:00:00 via OPHTHALMIC

## 2011-12-05 MED ORDER — BSS IO SOLN
INTRAOCULAR | Status: DC | PRN
  Administered 2011-12-05: 15 mL via INTRAOCULAR

## 2011-12-05 MED ORDER — LIDOCAINE HCL 3.5 % OP GEL
1.0000 "application " | Freq: Once | OPHTHALMIC | Status: AC
  Administered 2011-12-05: 1 via OPHTHALMIC

## 2011-12-05 MED ORDER — EPINEPHRINE HCL 1 MG/ML IJ SOLN
INTRAMUSCULAR | Status: AC
  Filled 2011-12-05: qty 1

## 2011-12-05 MED ORDER — LIDOCAINE 3.5 % OP GEL OPTIME - NO CHARGE
OPHTHALMIC | Status: DC | PRN
  Administered 2011-12-05: 2 [drp] via OPHTHALMIC

## 2011-12-05 SURGICAL SUPPLY — 33 items
CAPSULAR TENSION RING-AMO (OPHTHALMIC RELATED) IMPLANT
CLOTH BEACON ORANGE TIMEOUT ST (SAFETY) ×1 IMPLANT
EYE SHIELD UNIVERSAL CLEAR (GAUZE/BANDAGES/DRESSINGS) ×1 IMPLANT
GLOVE BIO SURGEON STRL SZ 6.5 (GLOVE) IMPLANT
GLOVE BIOGEL PI IND STRL 6.5 (GLOVE) IMPLANT
GLOVE BIOGEL PI IND STRL 7.0 (GLOVE) IMPLANT
GLOVE BIOGEL PI IND STRL 7.5 (GLOVE) IMPLANT
GLOVE BIOGEL PI INDICATOR 6.5 (GLOVE) ×2
GLOVE BIOGEL PI INDICATOR 7.0 (GLOVE)
GLOVE BIOGEL PI INDICATOR 7.5 (GLOVE)
GLOVE ECLIPSE 6.5 STRL STRAW (GLOVE) IMPLANT
GLOVE ECLIPSE 7.0 STRL STRAW (GLOVE) IMPLANT
GLOVE ECLIPSE 7.5 STRL STRAW (GLOVE) IMPLANT
GLOVE EXAM NITRILE LRG STRL (GLOVE) IMPLANT
GLOVE EXAM NITRILE MD LF STRL (GLOVE) ×1 IMPLANT
GLOVE SKINSENSE NS SZ6.5 (GLOVE)
GLOVE SKINSENSE NS SZ7.0 (GLOVE)
GLOVE SKINSENSE STRL SZ6.5 (GLOVE) IMPLANT
GLOVE SKINSENSE STRL SZ7.0 (GLOVE) IMPLANT
GOWN STRL REIN 2XL LVL4 (GOWN DISPOSABLE) ×1 IMPLANT
KIT VITRECTOMY (OPHTHALMIC RELATED) IMPLANT
PAD ARMBOARD 7.5X6 YLW CONV (MISCELLANEOUS) ×1 IMPLANT
PROC W NO LENS (INTRAOCULAR LENS)
PROC W SPEC LENS (INTRAOCULAR LENS)
PROCESS W NO LENS (INTRAOCULAR LENS) IMPLANT
PROCESS W SPEC LENS (INTRAOCULAR LENS) IMPLANT
RING MALYGIN (MISCELLANEOUS) IMPLANT
SIGHTPATH CAT PROC W REG LENS (Ophthalmic Related) ×2 IMPLANT
SYR TB 1ML LL NO SAFETY (SYRINGE) ×1 IMPLANT
TAPE SURG TRANSPORE 1 IN (GAUZE/BANDAGES/DRESSINGS) IMPLANT
TAPE SURGICAL TRANSPORE 1 IN (GAUZE/BANDAGES/DRESSINGS) ×1
VISCOELASTIC ADDITIONAL (OPHTHALMIC RELATED) IMPLANT
WATER STERILE IRR 250ML POUR (IV SOLUTION) ×1 IMPLANT

## 2011-12-05 NOTE — Transfer of Care (Signed)
Immediate Anesthesia Transfer of Care Note  Patient: Alan Hawkins  Procedure(s) Performed: Procedure(s) (LRB): CATARACT EXTRACTION PHACO AND INTRAOCULAR LENS PLACEMENT (IOC) (Left)  Patient Location: PACU and Short Stay  Anesthesia Type: MAC  Level of Consciousness: awake, alert  and oriented  Airway & Oxygen Therapy: Patient Spontanous Breathing  Post-op Assessment: Report given to PACU RN  Post vital signs: Reviewed and stable  Complications: No apparent anesthesia complications

## 2011-12-05 NOTE — Anesthesia Postprocedure Evaluation (Signed)
  Anesthesia Post-op Note  Patient: Alan Hawkins  Procedure(s) Performed: Procedure(s) (LRB): CATARACT EXTRACTION PHACO AND INTRAOCULAR LENS PLACEMENT (IOC) (Left)  Patient Location: PACU and Short Stay  Anesthesia Type: MAC  Level of Consciousness: awake, alert  and oriented  Airway and Oxygen Therapy: Patient Spontanous Breathing  Post-op Pain: none  Post-op Assessment: Post-op Vital signs reviewed, Patient's Cardiovascular Status Stable, Respiratory Function Stable, Patent Airway and No signs of Nausea or vomiting  Post-op Vital Signs: Reviewed and stable  Complications: No apparent anesthesia complications

## 2011-12-05 NOTE — Brief Op Note (Signed)
Pre-Op Dx: Cataract OS Post-Op Dx: Cataract OS Surgeon: Tonny Branch Anesthesia: Topical with MAC Implant: B&L enVista Specimen: None Complications: None

## 2011-12-05 NOTE — H&P (Signed)
I have reviewed the H&P, the patient was re-examined, and I have identified no interval changes in medical condition and plan of care since the history and physical of record  

## 2011-12-05 NOTE — Anesthesia Preprocedure Evaluation (Signed)
Anesthesia Evaluation  Patient identified by MRN, date of birth, ID band Patient awake    Reviewed: Allergy & Precautions, H&P , NPO status , Patient's Chart, lab work & pertinent test results  Airway Mallampati: II      Dental  (+) Teeth Intact and Implants   Pulmonary neg pulmonary ROS,  clear to auscultation        Cardiovascular hypertension, Pt. on medications Regular Normal    Neuro/Psych    GI/Hepatic GERD-  Medicated and Controlled,  Endo/Other    Renal/GU      Musculoskeletal   Abdominal   Peds  Hematology   Anesthesia Other Findings   Reproductive/Obstetrics                           Anesthesia Physical Anesthesia Plan  ASA: II  Anesthesia Plan: MAC   Post-op Pain Management:    Induction: Intravenous  Airway Management Planned: Nasal Cannula  Additional Equipment:   Intra-op Plan:   Post-operative Plan:   Informed Consent: I have reviewed the patients History and Physical, chart, labs and discussed the procedure including the risks, benefits and alternatives for the proposed anesthesia with the patient or authorized representative who has indicated his/her understanding and acceptance.     Plan Discussed with:   Anesthesia Plan Comments:         Anesthesia Quick Evaluation

## 2011-12-06 NOTE — Op Note (Signed)
[name], Alan Hawkins NO.:  [id number]  MEDICAL RECORD NO.:  [id number]  LOCATION:  [facility]                          FACILITY:  [facility]  PHYSICIAN:  Richardo Hanks, MD       DATE OF BIRTH:  04-16-1941  DATE OF PROCEDURE:  12/05/2011 DATE OF DISCHARGE:  12/05/2011                              OPERATIVE REPORT   PREOPERATIVE DIAGNOSIS:  Nuclear cataract, left eye, diagnosis code 366.16.  POSTOPERATIVE DIAGNOSIS:  Nuclear cataract, left eye, diagnosis code 366.16.  OPERATION PERFORMED:  Phacoemulsification with posterior chamber intraocular lens implantation, left eye.  SURGEON:  Richardo Hanks, MD  ANESTHESIA:  General endotracheal anesthesia.  OPERATIVE SUMMARY:  In the preoperative area, dilating drops were placed into the left eye.  The patient was then brought into the operating room where he was placed under general anesthesia.  The eye was then prepped and draped.  Beginning with a #75 blade, a paracentesis port was made at the surgeon's 2 o'clock position.  The anterior chamber was then filled with a 1% nonpreserved lidocaine solution with epinephrine.  This was followed by Viscoat to deepen the chamber.  A small fornix-based peritomy was performed superiorly.  Next, a single iris hook was placed through the limbus superiorly.  A 2.4-mm keratome blade was then used to make a clear corneal incision over the iris hook.  A bent cystotome needle and Utrata forceps were used to create a continuous tear capsulotomy.  Hydrodissection was performed using balanced salt solution on a fine cannula.  The lens nucleus was then removed using phacoemulsification in a quadrant cracking technique.  The cortical material was then removed with irrigation and aspiration.  The capsular bag and anterior chamber were refilled with Provisc.  The wound was widened to approximately 3 mm and a posterior chamber intraocular lens was placed into the capsular bag without difficulty using  an [employer] lens injecting system.  A single 10-0 nylon suture was then used to close the incision as well as stromal hydration.  The Provisc was removed from the anterior chamber and capsular bag with irrigation and aspiration.  At this point, the wounds were tested for leak, which were negative.  The anterior chamber remained deep and stable.  The patient tolerated the procedure well.  There were no operative complications, and he awoke from general anesthesia without problem.  No surgical specimens.  Prosthetic device used is a [employer], EnVista posterior chamber lens, model MX60, power of 24.0, serial number is [id number].          ______________________________ Richardo Hanks, MD     KEH/MEDQ  D:  12/05/2011  T:  12/06/2011  Job:  [id number]

## 2011-12-07 ENCOUNTER — Encounter (HOSPITAL_COMMUNITY): Payer: Self-pay | Admitting: Ophthalmology

## 2013-09-17 ENCOUNTER — Ambulatory Visit (HOSPITAL_COMMUNITY)
Admission: RE | Admit: 2013-09-17 | Discharge: 2013-09-17 | Disposition: A | Discharge status: Home / Self Care | Payer: Medicare Other | Source: Ambulatory Visit | Attending: Family Medicine | Admitting: Family Medicine

## 2013-09-17 ENCOUNTER — Other Ambulatory Visit (HOSPITAL_COMMUNITY): Payer: Self-pay | Admitting: Family Medicine

## 2013-09-17 DIAGNOSIS — R05 Cough: Secondary | ICD-10-CM | POA: Insufficient documentation

## 2013-09-17 DIAGNOSIS — I517 Cardiomegaly: Secondary | ICD-10-CM | POA: Insufficient documentation

## 2013-09-17 DIAGNOSIS — R059 Cough, unspecified: Secondary | ICD-10-CM | POA: Insufficient documentation

## 2013-09-17 DIAGNOSIS — R0600 Dyspnea, unspecified: Secondary | ICD-10-CM

## 2013-09-17 DIAGNOSIS — R110 Nausea: Secondary | ICD-10-CM | POA: Insufficient documentation

## 2013-09-17 DIAGNOSIS — R0602 Shortness of breath: Secondary | ICD-10-CM | POA: Insufficient documentation

## 2013-09-20 ENCOUNTER — Other Ambulatory Visit (HOSPITAL_COMMUNITY): Payer: Self-pay | Admitting: Family Medicine

## 2013-09-20 ENCOUNTER — Other Ambulatory Visit: Payer: Self-pay

## 2013-09-20 ENCOUNTER — Emergency Department (HOSPITAL_COMMUNITY): Payer: Medicare Other

## 2013-09-20 ENCOUNTER — Ambulatory Visit (HOSPITAL_COMMUNITY)
Admission: RE | Admit: 2013-09-20 | Discharge: 2013-09-20 | Disposition: A | Discharge status: Home / Self Care | Payer: Medicare Other | Source: Ambulatory Visit | Attending: Family Medicine | Admitting: Family Medicine

## 2013-09-20 ENCOUNTER — Ambulatory Visit: Payer: Medicare Other | Admitting: Cardiology

## 2013-09-20 ENCOUNTER — Encounter (HOSPITAL_COMMUNITY): Payer: Self-pay | Admitting: Emergency Medicine

## 2013-09-20 ENCOUNTER — Inpatient Hospital Stay (HOSPITAL_COMMUNITY)
Admission: EM | Admit: 2013-09-20 | Discharge: 2013-09-27 | DRG: 291 | Disposition: A | Discharge status: Home / Self Care | Payer: Medicare Other | Attending: Family Medicine | Admitting: Family Medicine

## 2013-09-20 DIAGNOSIS — I10 Essential (primary) hypertension: Secondary | ICD-10-CM

## 2013-09-20 DIAGNOSIS — I255 Ischemic cardiomyopathy: Secondary | ICD-10-CM

## 2013-09-20 DIAGNOSIS — I2789 Other specified pulmonary heart diseases: Secondary | ICD-10-CM | POA: Diagnosis present

## 2013-09-20 DIAGNOSIS — I2589 Other forms of chronic ischemic heart disease: Secondary | ICD-10-CM

## 2013-09-20 DIAGNOSIS — Z87891 Personal history of nicotine dependence: Secondary | ICD-10-CM | POA: Insufficient documentation

## 2013-09-20 DIAGNOSIS — D631 Anemia in chronic kidney disease: Secondary | ICD-10-CM | POA: Diagnosis present

## 2013-09-20 DIAGNOSIS — N183 Chronic kidney disease, stage 3 unspecified: Secondary | ICD-10-CM | POA: Diagnosis present

## 2013-09-20 DIAGNOSIS — N170 Acute kidney failure with tubular necrosis: Secondary | ICD-10-CM | POA: Diagnosis present

## 2013-09-20 DIAGNOSIS — N179 Acute kidney failure, unspecified: Secondary | ICD-10-CM

## 2013-09-20 DIAGNOSIS — Z8249 Family history of ischemic heart disease and other diseases of the circulatory system: Secondary | ICD-10-CM | POA: Insufficient documentation

## 2013-09-20 DIAGNOSIS — I5023 Acute on chronic systolic (congestive) heart failure: Principal | ICD-10-CM

## 2013-09-20 DIAGNOSIS — R0609 Other forms of dyspnea: Secondary | ICD-10-CM | POA: Insufficient documentation

## 2013-09-20 DIAGNOSIS — N2581 Secondary hyperparathyroidism of renal origin: Secondary | ICD-10-CM | POA: Diagnosis present

## 2013-09-20 DIAGNOSIS — R0789 Other chest pain: Secondary | ICD-10-CM

## 2013-09-20 DIAGNOSIS — N189 Chronic kidney disease, unspecified: Secondary | ICD-10-CM | POA: Diagnosis present

## 2013-09-20 DIAGNOSIS — I059 Rheumatic mitral valve disease, unspecified: Secondary | ICD-10-CM

## 2013-09-20 DIAGNOSIS — I129 Hypertensive chronic kidney disease with stage 1 through stage 4 chronic kidney disease, or unspecified chronic kidney disease: Secondary | ICD-10-CM | POA: Diagnosis present

## 2013-09-20 DIAGNOSIS — E785 Hyperlipidemia, unspecified: Secondary | ICD-10-CM | POA: Insufficient documentation

## 2013-09-20 DIAGNOSIS — K219 Gastro-esophageal reflux disease without esophagitis: Secondary | ICD-10-CM | POA: Diagnosis present

## 2013-09-20 DIAGNOSIS — Z23 Encounter for immunization: Secondary | ICD-10-CM

## 2013-09-20 DIAGNOSIS — Z87898 Personal history of other specified conditions: Secondary | ICD-10-CM

## 2013-09-20 DIAGNOSIS — N184 Chronic kidney disease, stage 4 (severe): Secondary | ICD-10-CM | POA: Diagnosis present

## 2013-09-20 DIAGNOSIS — I509 Heart failure, unspecified: Secondary | ICD-10-CM | POA: Diagnosis present

## 2013-09-20 DIAGNOSIS — R0602 Shortness of breath: Secondary | ICD-10-CM

## 2013-09-20 DIAGNOSIS — R809 Proteinuria, unspecified: Secondary | ICD-10-CM | POA: Diagnosis present

## 2013-09-20 DIAGNOSIS — Z823 Family history of stroke: Secondary | ICD-10-CM

## 2013-09-20 DIAGNOSIS — R0989 Other specified symptoms and signs involving the circulatory and respiratory systems: Secondary | ICD-10-CM | POA: Insufficient documentation

## 2013-09-20 HISTORY — DX: Essential (primary) hypertension: I10

## 2013-09-20 HISTORY — DX: Chronic kidney disease, stage 3 unspecified: N18.30

## 2013-09-20 HISTORY — DX: Chronic kidney disease, stage 3 (moderate): N18.3

## 2013-09-20 LAB — COMPREHENSIVE METABOLIC PANEL
ALT: 11 U/L (ref 0–53)
AST: 11 U/L (ref 0–37)
Albumin: 3.3 g/dL — ABNORMAL LOW (ref 3.5–5.2)
Alkaline Phosphatase: 60 U/L (ref 39–117)
CO2: 19 mEq/L (ref 19–32)
Calcium: 9.1 mg/dL (ref 8.4–10.5)
Creatinine, Ser: 8.32 mg/dL — ABNORMAL HIGH (ref 0.50–1.35)
Potassium: 5 mEq/L (ref 3.5–5.1)
Sodium: 140 mEq/L (ref 135–145)
Total Protein: 7 g/dL (ref 6.0–8.3)

## 2013-09-20 LAB — CBC WITH DIFFERENTIAL/PLATELET
Basophils Absolute: 0 10*3/uL (ref 0.0–0.1)
Basophils Relative: 1 % (ref 0–1)
Eosinophils Absolute: 1 10*3/uL — ABNORMAL HIGH (ref 0.0–0.7)
Eosinophils Relative: 17 % — ABNORMAL HIGH (ref 0–5)
Lymphocytes Relative: 23 % (ref 12–46)
Lymphs Abs: 1.3 10*3/uL (ref 0.7–4.0)
MCH: 30.7 pg (ref 26.0–34.0)
MCHC: 32.9 g/dL (ref 30.0–36.0)
MCV: 93.2 fL (ref 78.0–100.0)
Neutrophils Relative %: 51 % (ref 43–77)
Platelets: 265 10*3/uL (ref 150–400)
RBC: 3.39 MIL/uL — ABNORMAL LOW (ref 4.22–5.81)
RDW: 13.7 % (ref 11.5–15.5)

## 2013-09-20 LAB — TROPONIN I: Troponin I: <0.3 ng/mL (ref <0.30)

## 2013-09-20 LAB — PRO B NATRIURETIC PEPTIDE: Pro B Natriuretic peptide (BNP): 57268 pg/mL — ABNORMAL HIGH (ref 0–125)

## 2013-09-20 MED ORDER — ACETAMINOPHEN 325 MG PO TABS
650.0000 mg | ORAL_TABLET | ORAL | Status: DC | PRN
  Administered 2013-09-21 – 2013-09-22 (×4): 650 mg via ORAL
  Filled 2013-09-20 (×4): qty 2

## 2013-09-20 MED ORDER — SODIUM CHLORIDE 0.9 % IV SOLN: 250.0000 mL | INTRAVENOUS | Status: DC | PRN

## 2013-09-20 MED ORDER — SODIUM CHLORIDE 0.9 % IJ SOLN: 3.0000 mL | INTRAMUSCULAR | Status: DC | PRN

## 2013-09-20 MED ORDER — SODIUM CHLORIDE 0.9 % IJ SOLN
3.0000 mL | Freq: Two times a day (BID) | INTRAMUSCULAR | Status: DC
  Administered 2013-09-21 – 2013-09-25 (×5): 3 mL via INTRAVENOUS

## 2013-09-20 MED ORDER — CARVEDILOL 3.125 MG PO TABS
3.1250 mg | ORAL_TABLET | Freq: Two times a day (BID) | ORAL | Status: DC
  Administered 2013-09-20 – 2013-09-21 (×2): 3.125 mg via ORAL
  Filled 2013-09-20 (×2): qty 1

## 2013-09-20 MED ORDER — FUROSEMIDE 10 MG/ML IJ SOLN
80.0000 mg | Freq: Once | INTRAMUSCULAR | Status: DC
  Filled 2013-09-20: qty 8

## 2013-09-20 MED ORDER — HEPARIN SODIUM (PORCINE) 5000 UNIT/ML IJ SOLN
5000.0000 [IU] | Freq: Three times a day (TID) | INTRAMUSCULAR | Status: DC
  Administered 2013-09-20 – 2013-09-27 (×19): 5000 [IU] via SUBCUTANEOUS
  Filled 2013-09-20 (×19): qty 1

## 2013-09-20 MED ORDER — ASPIRIN EC 81 MG PO TBEC
81.0000 mg | DELAYED_RELEASE_TABLET | Freq: Every day | ORAL | Status: DC
  Administered 2013-09-21 – 2013-09-26 (×6): 81 mg via ORAL
  Filled 2013-09-20 (×6): qty 1

## 2013-09-20 MED ORDER — FUROSEMIDE 10 MG/ML IJ SOLN
80.0000 mg | Freq: Two times a day (BID) | INTRAMUSCULAR | Status: DC
  Administered 2013-09-21: 80 mg via INTRAVENOUS
  Filled 2013-09-20: qty 8

## 2013-09-20 MED ORDER — INFLUENZA VAC SPLIT QUAD 0.5 ML IM SUSP
0.5000 mL | INTRAMUSCULAR | Status: AC
  Administered 2013-09-21: 0.5 mL via INTRAMUSCULAR
  Filled 2013-09-20: qty 0.5

## 2013-09-20 MED ORDER — FUROSEMIDE 10 MG/ML IJ SOLN
80.0000 mg | Freq: Once | INTRAMUSCULAR | Status: AC
  Administered 2013-09-20: 80 mg via INTRAVENOUS

## 2013-09-20 MED ORDER — NITROGLYCERIN IN D5W 200-5 MCG/ML-% IV SOLN
2.0000 ug/min | INTRAVENOUS | Status: DC
  Administered 2013-09-20: 60 ug/min via INTRAVENOUS
  Administered 2013-09-20: 10 ug/min via INTRAVENOUS
  Administered 2013-09-21: 90 ug/min via INTRAVENOUS
  Administered 2013-09-21: 100 ug/min via INTRAVENOUS
  Administered 2013-09-21: 80 ug/min via INTRAVENOUS
  Administered 2013-09-21: 100 ug/min via INTRAVENOUS
  Administered 2013-09-21: 70 ug/min via INTRAVENOUS
  Administered 2013-09-21: 100 ug/min via INTRAVENOUS
  Filled 2013-09-20 (×3): qty 250

## 2013-09-20 MED ORDER — ONDANSETRON HCL 4 MG/2ML IJ SOLN
4.0000 mg | Freq: Four times a day (QID) | INTRAMUSCULAR | Status: DC | PRN
  Administered 2013-09-21 (×3): 4 mg via INTRAVENOUS
  Filled 2013-09-20 (×3): qty 2

## 2013-09-20 NOTE — Consult Note (Signed)
Primary care physician: Dr. Marjean Hawkins Consulting cardiologist: Dr. Satira Hawkins  Clinical Summary Alan Hawkins is a 72 y.o.male with a history of hypertension and reportedly chronic renal insufficiency with creatinine 2.0 six months ago per Dr. Everette Hawkins. He has been on a combination ARB/HCTZ, simplified to ARB within the last few months, possibly also on a beta blocker transiently. He reports a several week to month history of progressive dyspnea on exertion, also intermittent exertional nausea and chest tightness. These symptoms have gotten increasingly worse within the last few weeks. He has also been experiencing worsening leg edema. States that he urinates frequently but does not make much urine at that time. He was seen in the office by Dr. Everette Hawkins this week, lab work obtained with findings of a pro-BNP in the 68,000 range, creatinine up to 7.0. Dr. Everette Hawkins contacted our practice to get an echocardiogram done urgently this afternoon, and I evaluated the patient while he was in the echocardiogram department.  Echocardiogram done this afternoon showed mild to moderate LVH with LVEF approximately 35-40%, anterolateral and inferolateral wall motion abnormalities consistent with ischemic cardiomyopathy, grade 1 diastolic dysfunction with increased filling pressures. He had evidence of mild aortic regurgitation, mild mitral regurgitation, and moderate pulmonary hypertension with PASP 51 mm of mercury. Trivial to small pericardial effusion was also noted.  Discussion was had with the patient and his wife. He generally has preferred to stay out of the hospital and try to handle this as an outpatient, although agrees to be admitted for further assessment. I also spoke with Dr. Everette Hawkins who agreed that the patient should be hospitalized. He was taken to the ER to be registered and admitted from there to Dr. Everette Hawkins' service with plan to get nephrology consultation and proceed with workup from  there.   No Known Allergies  Medications at home Cozaar 50 mg by mouth daily Lasix (unknown dose) started recently Aspirin 81 mg by mouth daily   Past Medical History  Diagnosis Date  . Essential hypertension, benign   . Hyperlipidemia   . GERD (gastroesophageal reflux disease)   . CKD (chronic kidney disease) stage 3, GFR 30-59 ml/min     Creatinine reportedly 2.0 six months ago    Past Surgical History  Procedure Laterality Date  . Cataract extraction w/phaco  11/17/2011    Procedure: CATARACT EXTRACTION PHACO AND INTRAOCULAR LENS PLACEMENT (IOC);  Surgeon: Alan Branch, MD;  Location: [facility];  Service: Ophthalmology;  Laterality: Right;  CDE=16.17  . Eye surgery      R KPE w/ IOL  . Cataract extraction w/phaco  12/05/2011    Procedure: CATARACT EXTRACTION PHACO AND INTRAOCULAR LENS PLACEMENT (IOC);  Surgeon: Alan Branch, MD;  Location: [facility];  Service: Ophthalmology;  Laterality: Left;  CDE 16.22  . Vasectomy      Family History  Problem Relation Age of Onset  . Heart disease Mother   . Heart disease Sister   . Stroke Father     Social History Alan Hawkins reports that he has never smoked. He does not have any smokeless tobacco history on file. Alan Hawkins reports that he drinks alcohol.   Review of Systems As outlined above. He denies any palpitations or syncope. Mild orthopnea. Stable appetite. Fatigued in general. No cough, fevers or chills. Otherwise negative.  Physical Examination Blood pressure 206/100, pulse 72, temperature 97.5 F (36.4 C), temperature source Oral, resp. rate 20, height 5\' 10"  (1.778 m), weight 193 lb (87.544 kg), SpO2 99.00%. No intake  or output data in the 24 hours ending 09/20/13 1752  Overweight male, no acute distress. HEENT: Conjunctiva and lids normal, oropharynx clear. Neck: Supple, elevated JVP noted, no carotid bruits, no thyromegaly. Lungs: Scattered crackles at the bases with decreased breath sounds, nonlabored breathing at  rest. Cardiac: Regular rate and rhythm, no S3, soft systolic murmur, no pericardial rub. Abdomen: Soft, nontender, protuberant, bowel sounds present, no guarding or rebound. Extremities: 1-2+ edema, distal pulses 2+. Skin: Warm and dry. Musculoskeletal: No kyphosis. Neuropsychiatric: Alert and oriented x3, affect grossly appropriate.   Lab Results Pending at present  ECG Pending at present  Imaging CHEST 2 VIEW  COMPARISON: None.  FINDINGS:  There is moderate enlargement of the cardiac silhouette. The patient  has taken a full inspiration. There are patchy and linear opacities  in both lung bases, right greater than left. No definite pleural  effusion is identified. No acute osseous abnormality is seen.  IMPRESSION:  Cardiomegaly and bibasilar opacities, which may reflect pulmonary  edema or infection.  Impression  1. Progressive dyspnea on exertion and fatigue, also exertional nausea and chest discomfort. Suspect patient has been having angina symptoms for several weeks to months, could have possibly had an infarction at some point along the way based on his echocardiographic findings. He now has evidence of probable acute on chronic systolic heart failure with underlying ischemic cardiomyopathy based on information so far.  2. Acute on reportedly chronic renal failure, creatinine 7.0 by recent check per Dr. Everette Hawkins. Seems unlikely that the patient's cardiomyopathy is the only explanation for this. He has also been on ARB, although a longer-term medication. No other information available at this time.  3. History of hypertension. Blood pressure significantly elevated.   Recommendations  As noted above, patient sent to ER to be registered and admitted to Dr. Everette Hawkins for further assessment. Would obtain CBC, CMET, urinalysis, TSH, cardiac markers, fasting lipid panel, ECG to start. Hold ARB for now, patient needs Nephrology consultation as soon as possible for further evaluation.  He should likely be initiated on IV Lasix, will probably need high-dose to effect diuresis in the setting of his renal failure. Blood pressure can be further addressed with hydralazine/nitrate combination. Would also suggest initiating Coreg. Continue aspirin. Further evaluation of suspected ischemic heart disease will need to be considered, but more information is needed first, particularly determination of whether the patient's renal failure is reversible or not. Clearly we would try and hold off invasive cardiac evaluation now with extremely high risk of progressive contrast nephropathy and end-stage renal disease.   Alan Hawkins, M.D., F.A.C.C.

## 2013-09-20 NOTE — H&P (Signed)
PCP:   Lanette Hampshire, MD   Chief Complaint:  sob  HPI: 72 yo male with sob for over a month progressively worsening.  No fevers.  No cough.  No le edema or swelling.  No cp.  Went to see pcp mcinnis and found to have worsening renal failure.  Sent to see cards today, had echo showed icm ef 35%.  Sent to ED for further eval.  Recently started on lasix.  Sill making urine.  No prior cardiac w/u.  Review of Systems:  Positive and negative as per HPI otherwise all other systems are negative  Past Medical History: Past Medical History  Diagnosis Date  . Essential hypertension, benign   . Hyperlipidemia   . GERD (gastroesophageal reflux disease)   . CKD (chronic kidney disease) stage 3, GFR 30-59 ml/min     Creatinine reportedly 2.0 six months ago   Past Surgical History  Procedure Laterality Date  . Cataract extraction w/phaco  11/17/2011    Procedure: CATARACT EXTRACTION PHACO AND INTRAOCULAR LENS PLACEMENT (IOC);  Surgeon: Tonny Branch, MD;  Location: [facility];  Service: Ophthalmology;  Laterality: Right;  CDE=16.17  . Eye surgery      R KPE w/ IOL  . Cataract extraction w/phaco  12/05/2011    Procedure: CATARACT EXTRACTION PHACO AND INTRAOCULAR LENS PLACEMENT (IOC);  Surgeon: Tonny Branch, MD;  Location: [facility];  Service: Ophthalmology;  Laterality: Left;  CDE 16.22  . Vasectomy      Medications: Prior to Admission medications   Medication Sig Start Date End Date Taking? Authorizing Provider  albuterol (PROVENTIL) (2.5 MG/3ML) 0.083% nebulizer solution Take 2.5 mg by nebulization every 6 (six) hours as needed for wheezing or shortness of breath.   Yes Historical Provider, MD  aspirin EC 81 MG tablet Take 81 mg by mouth daily.   Yes Historical Provider, MD  Coenzyme Q10 (CO Q 10 PO) Take 1 capsule by mouth daily.   Yes Historical Provider, MD  furosemide (LASIX) 40 MG tablet Take 40 mg by mouth 2 (two) times daily.  09/19/13  Yes Historical Provider, MD  KLOR-CON M20 20 MEQ tablet Take  20 mEq by mouth daily.  09/19/13  Yes Historical Provider, MD  losartan (COZAAR) 50 MG tablet Take 50 mg by mouth daily.   Yes Historical Provider, MD  ondansetron (ZOFRAN) 4 MG tablet Take 4 mg by mouth every 4 (four) hours as needed. For nausea and/or vomiting 09/17/13  Yes Historical Provider, MD    Allergies:   Allergies  Allergen Reactions  . Bee Venom Anaphylaxis    Social History:  reports that he has never smoked. He does not have any smokeless tobacco history on file. He reports that he drinks alcohol. He reports that he does not use illicit drugs.  Family History: Family History  Problem Relation Age of Onset  . Heart disease Mother   . Heart disease Sister   . Stroke Father     Physical Exam: Filed Vitals:   09/20/13 1846 09/20/13 1900 09/20/13 1903 09/20/13 1904  BP: 210/110 213/115 212/110 206/105  Pulse: 62 77    Temp: 98.2 F (36.8 C)     TempSrc: Oral     Resp: 18 17    Height:      Weight:      SpO2: 98% 96%     General appearance: alert, cooperative and no distress Head: Normocephalic, without obvious abnormality, atraumatic Eyes: negative Nose: Nares normal. Septum midline. Mucosa normal. No drainage or  sinus tenderness. Neck: no JVD and supple, symmetrical, trachea midline Lungs: clear to auscultation bilaterally Heart: regular rate and rhythm, S1, S2 normal, no murmur, click, rub or gallop Abdomen: soft, non-tender; bowel sounds normal; no masses,  no organomegaly Extremities: extremities normal, atraumatic, no cyanosis or edema Pulses: 2+ and symmetric Skin: Skin color, texture, turgor normal. No rashes or lesions Neurologic: Grossly normal    Labs on Admission:   Recent Labs  09/20/13 1748  NA 140  K 5.0  CL 104  CO2 19  GLUCOSE 94  BUN 67*  CREATININE 8.32*  CALCIUM 9.1    Recent Labs  09/20/13 1748  AST 11  ALT 11  ALKPHOS 60  BILITOT 0.4  PROT 7.0  ALBUMIN 3.3*    Recent Labs  09/20/13 1748  WBC 5.7  NEUTROABS  2.9  HGB 10.4*  HCT 31.6*  MCV 93.2  PLT 265    Recent Labs  09/20/13 1748  TROPONINI <0.30   Radiological Exams on Admission: Dg Chest 2 View  09/20/2013   CLINICAL DATA:  Shortness of breath.  Nausea.  EXAM: CHEST  2 VIEW  COMPARISON:  09/17/2013  FINDINGS: Mild cardiomegaly is stable.  Changes of COPD again noted.  Bibasilar pulmonary opacity is again seen which is unchanged and is of uncertain chronicity. While this may represent pleural-parenchymal scarring. Some component of active infiltrate cannot be excluded.  In addition, there is a nodular density in the right upper lung field measuring approximately 2 cm  IMPRESSION: Stable bibasilar pulmonary opacity, which is of uncertain chronicity. Wall this may represent pleural-parenchymal scarring, some component of active infiltrate cannot be excluded.  Right upper lobe nodular density. Chest CT recommended to exclude pulmonary neoplasm.  Stable mild cardiomegaly and COPD.   Electronically Signed   By: Earle Gell M.D.   On: 09/20/2013 19:28   Assessment/Plan  72 yo male with acute on c systolic chf and worsening renal failure  Principal Problem:   Acute on chronic renal failure- hold arb.  Has icm also, will place on lasix 80 mg iv q12, monitor cr closely.  May ultimately need dialysis.  Active Problems:   Ischemic cardiomyopathy med management at this time due to poor renal function   Acute on chronic systolic heart failure place on coreg   Essential hypertension, benign  Place on ntg gtt   SOB (shortness of breath)  Stepdown.  He is unsure if he even wants to consider dialysis in the future.  DAVID,RACHAL A 09/20/2013, 8:19 PM

## 2013-09-20 NOTE — ED Notes (Signed)
Report called to Michelene Heady, RN in ICU.

## 2013-09-20 NOTE — Progress Notes (Signed)
*  PRELIMINARY RESULTS* Echocardiogram 2D Echocardiogram has been performed.  [place], [place] 09/20/2013, 4:51 PM

## 2013-09-20 NOTE — ED Notes (Addendum)
Sob for 1 week, Had echocardiogram today as OP.  And talks about "renal problem"    Supposed to be admitted.

## 2013-09-20 NOTE — ED Provider Notes (Signed)
CSN: [id number]     Arrival date & time 09/20/13  1706 History   First MD Initiated Contact with Patient 09/20/13 1716     Chief Complaint  Patient presents with  . Shortness of Breath   (Consider location/radiation/quality/duration/timing/severity/associated sxs/prior Treatment) HPI Alan Hawkins is a 72 year old man who presents today after being called by his primary care physician and told to come to the emergency department. He says he has had shortness of breath since last weekend. He was seen in Dr. Lavetta Nielsen office multiple times and states that he was started on Lasix. He had a reactive episode done today and then was called by Dr. Emilee Hero is office and told he did come to the ED because of new renal failure. He states he has a history of high blood pressure but that it has been higher than usual. He has had some shortness of breath on exertion. He has not noted any peripheral edema, chest pain, fever, or chills. Past Medical History  Diagnosis Date  . Essential hypertension, benign   . Hyperlipidemia   . GERD (gastroesophageal reflux disease)   . CKD (chronic kidney disease) stage 3, GFR 30-59 ml/min     Creatinine reportedly 2.0 six months ago   Past Surgical History  Procedure Laterality Date  . Cataract extraction w/phaco  11/17/2011    Procedure: CATARACT EXTRACTION PHACO AND INTRAOCULAR LENS PLACEMENT (IOC);  Surgeon: Tonny Branch, MD;  Location: [facility];  Service: Ophthalmology;  Laterality: Right;  CDE=16.17  . Eye surgery      R KPE w/ IOL  . Cataract extraction w/phaco  12/05/2011    Procedure: CATARACT EXTRACTION PHACO AND INTRAOCULAR LENS PLACEMENT (IOC);  Surgeon: Tonny Branch, MD;  Location: [facility];  Service: Ophthalmology;  Laterality: Left;  CDE 16.22  . Vasectomy     Family History  Problem Relation Age of Onset  . Heart disease Mother   . Heart disease Sister   . Stroke Father    History  Substance Use Topics  . Smoking status: Never Smoker   . Smokeless  tobacco: Not on file  . Alcohol Use: Yes     Comment: Occasional monthly beer    Review of Systems  All other systems reviewed and are negative.    Allergies  Bee venom  Home Medications   Current Outpatient Rx  Name  Route  Sig  Dispense  Refill  . albuterol (PROVENTIL) (2.5 MG/3ML) 0.083% nebulizer solution   Nebulization   Take 2.5 mg by nebulization every 6 (six) hours as needed for wheezing or shortness of breath.         Marland Kitchen aspirin EC 81 MG tablet   Oral   Take 81 mg by mouth daily.         . Coenzyme Q10 (CO Q 10 PO)   Oral   Take 1 capsule by mouth daily.         . furosemide (LASIX) 40 MG tablet   Oral   Take 40 mg by mouth 2 (two) times daily.          Marland Kitchen KLOR-CON M20 20 MEQ tablet   Oral   Take 20 mEq by mouth daily.          Marland Kitchen losartan (COZAAR) 50 MG tablet   Oral   Take 50 mg by mouth daily.         . ondansetron (ZOFRAN) 4 MG tablet   Oral   Take 4 mg by mouth every 4 (  four) hours as needed. For nausea and/or vomiting          BP 206/105  Pulse 77  Temp(Src) 98.2 F (36.8 C) (Oral)  Resp 17  Ht 5\' 10"  (1.778 m)  Wt 193 lb (87.544 kg)  BMI 27.69 kg/m2  SpO2 96% Physical Exam  Nursing note and vitals reviewed. Constitutional: He is oriented to person, place, and time. He appears well-developed and well-nourished.  HENT:  Head: Normocephalic and atraumatic.  Right Ear: External ear normal.  Left Ear: External ear normal.  Nose: Nose normal.  Mouth/Throat: Oropharynx is clear and moist.  Eyes: Conjunctivae and EOM are normal. Pupils are equal, round, and reactive to light.  Neck: Normal range of motion. Neck supple.  Cardiovascular: Normal rate, regular rhythm, normal heart sounds and intact distal pulses.   Pulmonary/Chest: Effort normal and breath sounds normal. No respiratory distress. He has no wheezes. He exhibits no tenderness.  Patient with some crackles bilateral bases  Abdominal: Soft. Bowel sounds are normal. He  exhibits no distension and no mass. There is no tenderness. There is no guarding.  Musculoskeletal: Normal range of motion.  Neurological: He is alert and oriented to person, place, and time. He has normal reflexes. He exhibits normal muscle tone. Coordination normal.  Skin: Skin is warm and dry.  Psychiatric: He has a normal mood and affect. His behavior is normal. Judgment and thought content normal.    ED Course  Procedures (including critical care time) Labs Review Labs Reviewed  CBC WITH DIFFERENTIAL - Abnormal; Notable for the following:    RBC 3.39 (*)    Hemoglobin 10.4 (*)    HCT 31.6 (*)    Eosinophils Relative 17 (*)    Eosinophils Absolute 1.0 (*)    All other components within normal limits  COMPREHENSIVE METABOLIC PANEL - Abnormal; Notable for the following:    BUN 67 (*)    Creatinine, Ser 8.32 (*)    Albumin 3.3 (*)    GFR calc non Af Amer 6 (*)    GFR calc Af Amer 7 (*)    All other components within normal limits  PRO B NATRIURETIC PEPTIDE - Abnormal; Notable for the following:    Pro B Natriuretic peptide (BNP) 57268.0 (*)    All other components within normal limits  TROPONIN I   Imaging Review Dg Chest 2 View  09/20/2013   CLINICAL DATA:  Shortness of breath.  Nausea.  EXAM: CHEST  2 VIEW  COMPARISON:  09/17/2013  FINDINGS: Mild cardiomegaly is stable.  Changes of COPD again noted.  Bibasilar pulmonary opacity is again seen which is unchanged and is of uncertain chronicity. While this may represent pleural-parenchymal scarring. Some component of active infiltrate cannot be excluded.  In addition, there is a nodular density in the right upper lung field measuring approximately 2 cm  IMPRESSION: Stable bibasilar pulmonary opacity, which is of uncertain chronicity. Wall this may represent pleural-parenchymal scarring, some component of active infiltrate cannot be excluded.  Right upper lobe nodular density. Chest CT recommended to exclude pulmonary neoplasm.  Stable  mild cardiomegaly and COPD.   Electronically Signed   By: Earle Gell M.D.   On: 09/20/2013 19:28    EKG Interpretation   None       Date: 09/20/2013  Rate: 82  Rhythm: normal sinus rhythm  QRS Axis: right  Intervals: normal  ST/T Wave abnormalities: nonspecific ST changes  Conduction Disutrbances:none  Narrative Interpretation:   Old EKG Reviewed: unchanged  MDM   1. Ischemic cardiomyopathy   2. Acute on chronic renal failure   3. Acute on chronic systolic heart failure   4. Essential hypertension, benign     I personally performed the services described in this documentation, which was scribed in my presence. The recorded information has been reviewed and considered.    Shaune Pollack, MD 09/21/13 [phone]

## 2013-09-21 ENCOUNTER — Inpatient Hospital Stay (HOSPITAL_COMMUNITY): Payer: Medicare Other

## 2013-09-21 LAB — URINALYSIS, ROUTINE W REFLEX MICROSCOPIC
Ketones, ur: NEGATIVE mg/dL
Leukocytes, UA: NEGATIVE
Nitrite: NEGATIVE
Specific Gravity, Urine: 1.02 (ref 1.005–1.030)
Urobilinogen, UA: 0.2 mg/dL (ref 0.0–1.0)

## 2013-09-21 LAB — BASIC METABOLIC PANEL
BUN: 67 mg/dL — ABNORMAL HIGH (ref 6–23)
CO2: 18 mEq/L — ABNORMAL LOW (ref 19–32)
Calcium: 9.2 mg/dL (ref 8.4–10.5)
Chloride: 105 mEq/L (ref 96–112)
Creatinine, Ser: 8.38 mg/dL — ABNORMAL HIGH (ref 0.50–1.35)
GFR calc Af Amer: 6 mL/min — ABNORMAL LOW (ref >90)
Glucose, Bld: 113 mg/dL — ABNORMAL HIGH (ref 70–99)
Potassium: 5.1 mEq/L (ref 3.5–5.1)

## 2013-09-21 LAB — HEPATITIS C ANTIBODY: HCV Ab: NEGATIVE

## 2013-09-21 LAB — FERRITIN: Ferritin: 349 ng/mL — ABNORMAL HIGH (ref 22–322)

## 2013-09-21 LAB — TROPONIN I
Troponin I: <0.3 ng/mL (ref <0.30)
Troponin I: <0.3 ng/mL (ref <0.30)

## 2013-09-21 LAB — IRON AND TIBC
TIBC: 241 ug/dL (ref 215–435)
UIBC: 187 ug/dL (ref 125–400)

## 2013-09-21 LAB — PHOSPHORUS: Phosphorus: 5.2 mg/dL — ABNORMAL HIGH (ref 2.3–4.6)

## 2013-09-21 MED ORDER — METOPROLOL TARTRATE 50 MG PO TABS
50.0000 mg | ORAL_TABLET | Freq: Once | ORAL | Status: AC
  Administered 2013-09-21: 50 mg via ORAL

## 2013-09-21 MED ORDER — METOPROLOL TARTRATE 50 MG PO TABS
50.0000 mg | ORAL_TABLET | Freq: Two times a day (BID) | ORAL | Status: DC
  Administered 2013-09-21 – 2013-09-22 (×3): 50 mg via ORAL
  Filled 2013-09-21 (×4): qty 1

## 2013-09-21 MED ORDER — FUROSEMIDE 10 MG/ML IJ SOLN
120.0000 mg | Freq: Two times a day (BID) | INTRAMUSCULAR | Status: DC
  Administered 2013-09-21 – 2013-09-22 (×2): 120 mg via INTRAVENOUS
  Filled 2013-09-21 (×4): qty 12

## 2013-09-21 MED ORDER — SODIUM CHLORIDE 0.9 % IV SOLN
INTRAVENOUS | Status: DC
  Administered 2013-09-21 – 2013-09-27 (×8): via INTRAVENOUS

## 2013-09-21 MED ORDER — PROMETHAZINE HCL 12.5 MG PO TABS
25.0000 mg | ORAL_TABLET | ORAL | Status: DC | PRN
  Filled 2013-09-21 (×2): qty 2

## 2013-09-21 MED ORDER — ONDANSETRON 8 MG/NS 50 ML IVPB
8.0000 mg | Freq: Four times a day (QID) | INTRAVENOUS | Status: DC | PRN
  Filled 2013-09-21: qty 8

## 2013-09-21 NOTE — Progress Notes (Signed)
Patient continues to c/o severe headache, unrelieved by tylenol, believed to be secondary to nitroglycerin gtt. Dr Karie Kirks notified by phone of headache, and nausea. Order received to start po metoprolol and phenergan, and start titrating nito gtt down. Patient informed of plan.

## 2013-09-21 NOTE — Consult Note (Signed)
Reason for Consult: Acute on chronic Referring Physician: Dr. [name] is an 72 y.o. male.  HPI: He is a patient was history of hypertension and ischemic cardiomyopathy with ejection fraction of 35-45% presently came because of difficulty breathing, orthopnea and also poor appetite. He was a patient denies any previous history of renal failure he seems to have underlying stage III chronic renal failure. Patient presently says that he's feeling better. He has some cough but no significant sputum production. Patient also denies any chills. Patient also denies any previous history of kidney stone.  Past Medical History  Diagnosis Date  . Essential hypertension, benign   . Hyperlipidemia   . GERD (gastroesophageal reflux disease)   . CKD (chronic kidney disease) stage 3, GFR 30-59 ml/min     Creatinine reportedly 2.0 six months ago    Past Surgical History  Procedure Laterality Date  . Cataract extraction w/phaco  11/17/2011    Procedure: CATARACT EXTRACTION PHACO AND INTRAOCULAR LENS PLACEMENT (IOC);  Surgeon: Alan Branch, MD;  Location: [facility];  Service: Ophthalmology;  Laterality: Right;  CDE=16.17  . Eye surgery      R KPE w/ IOL  . Cataract extraction w/phaco  12/05/2011    Procedure: CATARACT EXTRACTION PHACO AND INTRAOCULAR LENS PLACEMENT (IOC);  Surgeon: Alan Branch, MD;  Location: [facility];  Service: Ophthalmology;  Laterality: Left;  CDE 16.22  . Vasectomy      Family History  Problem Relation Age of Onset  . Heart disease Mother   . Heart disease Sister   . Stroke Father     Social History:  reports that he has never smoked. He does not have any smokeless tobacco history on file. He reports that he drinks alcohol. He reports that he does not use illicit drugs.  Allergies:  Allergies  Allergen Reactions  . Bee Venom Anaphylaxis    Medications: I have reviewed the patient's current medications.  Results for orders placed during the hospital encounter of 09/20/13  (from the past 48 hour(s))  CBC WITH DIFFERENTIAL     Status: Abnormal   Collection Time    09/20/13  5:48 PM      Result Value Range   WBC 5.7  4.0 - 10.5 K/uL   RBC 3.39 (*) 4.22 - 5.81 MIL/uL   Hemoglobin 10.4 (*) 13.0 - 17.0 g/dL   HCT 31.6 (*) 39.0 - 52.0 %   MCV 93.2  78.0 - 100.0 fL   MCH 30.7  26.0 - 34.0 pg   MCHC 32.9  30.0 - 36.0 g/dL   RDW 13.7  11.5 - 15.5 %   Platelets 265  150 - 400 K/uL   Neutrophils Relative % 51  43 - 77 %   Neutro Abs 2.9  1.7 - 7.7 K/uL   Lymphocytes Relative 23  12 - 46 %   Lymphs Abs 1.3  0.7 - 4.0 K/uL   Monocytes Relative 8  3 - 12 %   Monocytes Absolute 0.5  0.1 - 1.0 K/uL   Eosinophils Relative 17 (*) 0 - 5 %   Eosinophils Absolute 1.0 (*) 0.0 - 0.7 K/uL   Basophils Relative 1  0 - 1 %   Basophils Absolute 0.0  0.0 - 0.1 K/uL  COMPREHENSIVE METABOLIC PANEL     Status: Abnormal   Collection Time    09/20/13  5:48 PM      Result Value Range   Sodium 140  135 - 145 mEq/L  Potassium 5.0  3.5 - 5.1 mEq/L   Chloride 104  96 - 112 mEq/L   CO2 19  19 - 32 mEq/L   Glucose, Bld 94  70 - 99 mg/dL   BUN 67 (*) 6 - 23 mg/dL   Creatinine, Ser 8.32 (*) 0.50 - 1.35 mg/dL   Calcium 9.1  8.4 - 10.5 mg/dL   Total Protein 7.0  6.0 - 8.3 g/dL   Albumin 3.3 (*) 3.5 - 5.2 g/dL   AST 11  0 - 37 U/L   ALT 11  0 - 53 U/L   Alkaline Phosphatase 60  39 - 117 U/L   Total Bilirubin 0.4  0.3 - 1.2 mg/dL   GFR calc non Af Amer 6 (*) >90 mL/min   GFR calc Af Amer 7 (*) >90 mL/min   Comment: (NOTE)     The eGFR has been calculated using the CKD EPI equation.     This calculation has not been validated in all clinical situations.     eGFR's persistently <90 mL/min signify possible Chronic Kidney     Disease.  TROPONIN I     Status: None   Collection Time    09/20/13  5:48 PM      Result Value Range   Troponin I <0.30  <0.30 ng/mL   Comment:            Due to the release kinetics of cTnI,     a negative result within the first hours     of the onset of  symptoms does not rule out     myocardial infarction with certainty.     If myocardial infarction is still suspected,     repeat the test at appropriate intervals.  PRO B NATRIURETIC PEPTIDE     Status: Abnormal   Collection Time    09/20/13  5:48 PM      Result Value Range   Pro B Natriuretic peptide (BNP) 57268.0 (*) 0 - 125 pg/mL  MRSA PCR SCREENING     Status: None   Collection Time    09/20/13 10:00 PM      Result Value Range   MRSA by PCR NEGATIVE  NEGATIVE   Comment:            The GeneXpert MRSA Assay (FDA     approved for NASAL specimens     only), is one component of a     comprehensive MRSA colonization     surveillance program. It is not     intended to diagnose MRSA     infection nor to guide or     monitor treatment for     MRSA infections.  TROPONIN I     Status: None   Collection Time    09/20/13 10:39 PM      Result Value Range   Troponin I <0.30  <0.30 ng/mL   Comment:            Due to the release kinetics of cTnI,     a negative result within the first hours     of the onset of symptoms does not rule out     myocardial infarction with certainty.     If myocardial infarction is still suspected,     repeat the test at appropriate intervals.  TROPONIN I     Status: None   Collection Time    09/21/13  4:27 AM      Result Value Range  Troponin I <0.30  <0.30 ng/mL   Comment:            Due to the release kinetics of cTnI,     a negative result within the first hours     of the onset of symptoms does not rule out     myocardial infarction with certainty.     If myocardial infarction is still suspected,     repeat the test at appropriate intervals.  BASIC METABOLIC PANEL     Status: Abnormal   Collection Time    09/21/13  4:28 AM      Result Value Range   Sodium 140  135 - 145 mEq/L   Potassium 5.1  3.5 - 5.1 mEq/L   Chloride 105  96 - 112 mEq/L   CO2 18 (*) 19 - 32 mEq/L   Glucose, Bld 113 (*) 70 - 99 mg/dL   BUN 67 (*) 6 - 23 mg/dL    Creatinine, Ser 8.38 (*) 0.50 - 1.35 mg/dL   Calcium 9.2  8.4 - 10.5 mg/dL   GFR calc non Af Amer 6 (*) >90 mL/min   GFR calc Af Amer 6 (*) >90 mL/min   Comment: (NOTE)     The eGFR has been calculated using the CKD EPI equation.     This calculation has not been validated in all clinical situations.     eGFR's persistently <90 mL/min signify possible Chronic Kidney     Disease.    Dg Chest 2 View  09/20/2013   CLINICAL DATA:  Shortness of breath.  Nausea.  EXAM: CHEST  2 VIEW  COMPARISON:  09/17/2013  FINDINGS: Mild cardiomegaly is stable.  Changes of COPD again noted.  Bibasilar pulmonary opacity is again seen which is unchanged and is of uncertain chronicity. While this may represent pleural-parenchymal scarring. Some component of active infiltrate cannot be excluded.  In addition, there is a nodular density in the right upper lung field measuring approximately 2 cm  IMPRESSION: Stable bibasilar pulmonary opacity, which is of uncertain chronicity. Wall this may represent pleural-parenchymal scarring, some component of active infiltrate cannot be excluded.  Right upper lobe nodular density. Chest CT recommended to exclude pulmonary neoplasm.  Stable mild cardiomegaly and COPD.   Electronically Signed   By: Earle Gell M.D.   On: 09/20/2013 19:28    Review of Systems  Constitutional: Negative for fever and chills.  Respiratory: Positive for cough, sputum production and shortness of breath.   Cardiovascular: Positive for orthopnea.  Gastrointestinal: Positive for nausea. Negative for vomiting, abdominal pain and diarrhea.   Blood pressure 175/129, pulse 87, temperature 97.7 F (36.5 C), temperature source Oral, resp. rate 13, height 5\' 10"  (1.778 m), weight 84.959 kg (187 lb 4.8 oz), SpO2 96.00%. Physical Exam  Constitutional: He is oriented to person, place, and time.  Eyes: No scleral icterus.  Neck: No JVD present.  Cardiovascular: Normal rate and regular rhythm.   No murmur  heard. Respiratory: No respiratory distress. He has no wheezes. He has rales.  GI: He exhibits no distension. There is no tenderness. There is no rebound.  Musculoskeletal: He exhibits no edema.  Neurological: He is alert and oriented to person, place, and time.    Assessment/Plan: Problem #1 acute kidney injury superimposed on chronic. This could be secondary to prerenal syndrome as patient was on Lasix/ARBS/ATN. Presently patient is none oliguric. Problem #2 chronic renal failure: He is the creatinine was 2 about 6 months ago. He is consistent  with stage III chronic renal failure. The etiology could be hypertensive nephrosclerosis versus ischemic nephropathy. At this moment other etiologies cannot ruled out. Problem #3 history of difficulty breathing: This could be secondary to CHF. Chest x-ray with cardiomegaly and bibasilar opacity. Patient also with low ejection fraction. Presently patient claims is feeling better. Mainly he has exertional dyspnea and also some orthopnea. Problem #4 anemia: Etiology not clear possibly secondary to chronic renal failure however an anemia of iron deficiency cannot ruled out. Problem #5 ischemic cardiomyopathy Problem #6 history of hypertension:. Presently is on nitro drip his blood pressure seems to be better. Problem #7 hyperlipidemia Problem #8 moderate pulmonary hypertension. Plan: We'll start patient on normal saline at 100 cc per hour.           We'll change his Lasix 120 mg IV twice a day           We'll check also ultrasound of the kidneys.           We'll check his UA, and we'll check iron studies, ANA, complement           Will check hepatitis B surface antigen, hepatitis C antibody, c-ANCA           Patient has proteinuria we'll do 24-hour urine for protein and immunoelectrophoresis.          His renal function does not improve will consider initiating dialysis and I have discussed with the patient.  Alan Hawkins S 09/21/2013, 10:02 AM

## 2013-09-21 NOTE — Progress Notes (Signed)
Nephrology consult given directly to Dr Lowanda Foster.

## 2013-09-22 LAB — CBC
HCT: 30.1 % — ABNORMAL LOW (ref 39.0–52.0)
MCV: 91.5 fL (ref 78.0–100.0)
Platelets: 276 10*3/uL (ref 150–400)
RDW: 13.5 % (ref 11.5–15.5)
WBC: 6.6 10*3/uL (ref 4.0–10.5)

## 2013-09-22 LAB — BASIC METABOLIC PANEL
BUN: 66 mg/dL — ABNORMAL HIGH (ref 6–23)
CO2: 20 mEq/L (ref 19–32)
Calcium: 9 mg/dL (ref 8.4–10.5)
Chloride: 104 mEq/L (ref 96–112)
Creatinine, Ser: 8.67 mg/dL — ABNORMAL HIGH (ref 0.50–1.35)
GFR calc non Af Amer: 5 mL/min — ABNORMAL LOW (ref >90)
Glucose, Bld: 107 mg/dL — ABNORMAL HIGH (ref 70–99)

## 2013-09-22 MED ORDER — CLONIDINE HCL 0.1 MG PO TABS
0.1000 mg | ORAL_TABLET | Freq: Three times a day (TID) | ORAL | Status: DC
  Administered 2013-09-22 – 2013-09-24 (×7): 0.1 mg via ORAL
  Filled 2013-09-22 (×7): qty 1

## 2013-09-22 MED ORDER — FUROSEMIDE 10 MG/ML IJ SOLN
200.0000 mg | Freq: Two times a day (BID) | INTRAVENOUS | Status: DC
  Administered 2013-09-22 – 2013-09-23 (×2): 200 mg via INTRAVENOUS
  Filled 2013-09-22 (×2): qty 20

## 2013-09-22 MED ORDER — AMLODIPINE BESYLATE 5 MG PO TABS
10.0000 mg | ORAL_TABLET | Freq: Every day | ORAL | Status: DC
  Administered 2013-09-22 – 2013-09-26 (×5): 10 mg via ORAL
  Filled 2013-09-22 (×5): qty 2

## 2013-09-22 NOTE — Progress Notes (Signed)
NAMEELYON, YOUNGKIN NO.:  [id number]  MEDICAL RECORD NO.:  [id number]  LOCATION:  IC03                          FACILITY:  [facility]  PHYSICIAN:  Kasidee Voisin G. Everette Rank, MD   DATE OF BIRTH:  [birth year]  DATE OF PROCEDURE: DATE OF DISCHARGE:                                PROGRESS NOTE   This patient was admitted to the hospital yesterday with symptoms of shortness of breath with exertion.  He had previously been seen in the office and subsequently was seen by Cardiology.  The patient was noted to have markedly elevated creatinine 8.32, potassium 5, and proBNP is 57,264.  The patient has noted some decrease in production of urine.  He had been started on furosemide.  A 2D echo done showed 35-40% ejection fraction, was consistent with ischemic cardiomyopathy, and grade 1 diastolic dysfunction.  It was felt that his renal failure should be addressed with possible dialysis.  Patient fairly comfortable this morning.  PHYSICAL EXAMINATION:  VITAL SIGNS:  Blood pressure 175/129, respirations 13, pulse 87, temperature 97.7. HEENT:  Eyes PERRLA.  TM negative.  Oropharynx benign. NECK:  Supple.  No JVD or thyroid abnormalities. LUNGS:  Occasional rhonchus heard at the base. HEART:  Regular rhythm.  No murmurs. ABDOMEN:  No palpable organs or masses. EXTREMITIES:  Mild edema.  ASSESSMENT:  The patient is admitted with acute on chronic renal failure, stage IV.  He does have mild pulmonary edema, markedly elevated proBNP, and end-stage renal disease.  PLAN:  To proceed with possible dialysis.  I discussed the situation with Dr. Lowanda Foster, who agrees to see the patient.     Kooper Chriswell G. Everette Rank, MD     AGM/MEDQ  D:  09/21/2013  T:  09/22/2013  Job:  [id number]

## 2013-09-22 NOTE — Progress Notes (Signed)
NAMEKENECHUKWU, CONROY NO.:  [id number]  MEDICAL RECORD NO.:  [id number]  LOCATION:  IC03                          FACILITY:  [facility]  PHYSICIAN:  Elantra Caprara G. Mashelle Busick, MD   DATE OF BIRTH:  [birth year]  DATE OF PROCEDURE: DATE OF DISCHARGE:                                PROGRESS NOTE   SUBJECTIVE:  This patient was admitted with acute-on-chronic renal failure stage IV and mild pulmonary edema with markedly elevated proBNP and end-stage renal disease due to the nitroglycerin drip.  The patient did have severe headache and nausea.  His blood pressure has been elevated since admission and remains elevated.  His creatinine now is 8.67, BUN 66.  OBJECTIVE:  VITAL SIGNS:  Blood pressure 184/110, respirations 24, pulse 56, temperature 98. HEENT:  Eyes PERRLA. TM negative.  Oropharynx benign. NECK:  Supple.  No JVD or thyroid abnormalities. LUNGS:  Occasional rhonchus heard at base of lungs. HEART:  Regular rhythm.  No murmurs. ABDOMEN:  No palpable organs or masses. EXTREMITIES:  Mild edema.  ASSESSMENT:  The patient is admitted with acute-on-chronic renal failure, stage IV, mild pulmonary edema, end-stage renal disease, and hypertension.  PLAN:  To continue current treatment with IV furosemide.  I have added clonidine 0.1 mg every 8 hours to help with blood pressure control and metoprolol 50 mg b.i.d.  The patient still is a candidate for dialysis. Nephrology will determine when this is appropriate.     Oralee Rapaport G. Everette Rank, MD     AGM/MEDQ  D:  09/22/2013  T:  09/22/2013  Job:  [id number]

## 2013-09-22 NOTE — Progress Notes (Signed)
Pharmacist Heart Failure Core Measure Documentation  Assessment: Alan Hawkins has an EF documented as 35-40% on 09/20/2013 by 2D Echo.  Rationale: Heart failure patients with left ventricular systolic dysfunction (LVSD) and an EF < 40% should be prescribed an angiotensin converting enzyme inhibitor (ACEI) or angiotensin receptor blocker (ARB) at discharge unless a contraindication is documented in the medical record.  This patient is not currently on an ACEI or ARB for HF.  This note is being placed in the record in order to provide documentation that a contraindication to the use of these agents is present for this encounter.  ACE Inhibitor or Angiotensin Receptor Blocker is contraindicated (specify all that apply)  []   ACEI allergy AND ARB allergy []   Angioedema []   Moderate or severe aortic stenosis []   Hyperkalemia []   Hypotension []   Renal artery stenosis [x]   Worsening renal function, preexisting renal disease or dysfunction   Pricilla Larsson 09/22/2013 1:27 PM

## 2013-09-22 NOTE — Progress Notes (Signed)
Subjective: Interval History: has no complaint of nausea or vomiting. Patient denies any difficulty in breathing. Patient overall feels better. His appetite is good.. To Objective: Vital signs in last 24 hours: Temp:  [97.7 F (36.5 C)-98.4 F (36.9 C)] 98 F (36.7 C) (12/14 0400) Pulse Rate:  [56-88] 56 (12/14 0700) Resp:  [13-28] 24 (12/14 0800) BP: (130-207)/(68-118) 184/110 mmHg (12/14 0800) SpO2:  [92 %-98 %] 95 % (12/14 0700) Weight:  [84.3 kg (185 lb 13.6 oz)] 84.3 kg (185 lb 13.6 oz) (12/14 0500) Weight change: -3.244 kg (-7 lb 2.4 oz)  Intake/Output from previous day: 12/13 0701 - 12/14 0700 In: 2190.9 [P.O.:240; I.V.:1950.9] Out: 900 [Urine:900] Intake/Output this shift: Total I/O In: 124 [IV Piggyback:124] Out: -   Generally patient is alert in no apparent distress. Neck supple no JVD. Chest decreased breath sounds bilaterally. He does have any rales. Heart exam revealed regular rate and rhythm no murmur Abdomen soft positive bowel sound Extremities no edema.  Lab Results:  Recent Labs  09/20/13 1748 09/22/13 0451  WBC 5.7 6.6  HGB 10.4* 10.3*  HCT 31.6* 30.1*  PLT 265 276   BMET:  Recent Labs  09/21/13 0428 09/22/13 0451  NA 140 140  K 5.1 4.4  CL 105 104  CO2 18* 20  GLUCOSE 113* 107*  BUN 67* 66*  CREATININE 8.38* 8.67*  CALCIUM 9.2 9.0   No results found for this basename: PTH,  in the last 72 hours Iron Studies:  Recent Labs  09/21/13 1243  IRON 54  TIBC 241  FERRITIN 349*    Studies/Results: Dg Chest 2 View  09/20/2013   CLINICAL DATA:  Shortness of breath.  Nausea.  EXAM: CHEST  2 VIEW  COMPARISON:  09/17/2013  FINDINGS: Mild cardiomegaly is stable.  Changes of COPD again noted.  Bibasilar pulmonary opacity is again seen which is unchanged and is of uncertain chronicity. While this may represent pleural-parenchymal scarring. Some component of active infiltrate cannot be excluded.  In addition, there is a nodular density in the  right upper lung field measuring approximately 2 cm  IMPRESSION: Stable bibasilar pulmonary opacity, which is of uncertain chronicity. Wall this may represent pleural-parenchymal scarring, some component of active infiltrate cannot be excluded.  Right upper lobe nodular density. Chest CT recommended to exclude pulmonary neoplasm.  Stable mild cardiomegaly and COPD.   Electronically Signed   By: Earle Gell M.D.   On: 09/20/2013 19:28   US Renal  09/21/2013   CLINICAL DATA:  Renal failure  EXAM: RENAL/URINARY TRACT ULTRASOUND COMPLETE  COMPARISON:  None.  FINDINGS: Right Kidney:  Length: 10.4 cm. Increased parenchymal echogenicity, suggesting medical renal disease. No hydronephrosis.  Left Kidney:  Length: 11.5 cm. Increased parenchymal echogenicity, suggesting medical renal disease. 1.2 x 1.0 x 1.7 cm upper pole cyst. No hydronephrosis.  Bladder:  Within normal limits.  IMPRESSION: Increased renal parenchymal echogenicity, suggesting medical renal disease.  1.7 cm left upper pole renal cyst.  No hydronephrosis.   Electronically Signed   By: Julian Hy M.D.   On: 09/21/2013 12:08    I have reviewed the patient's current medications.  Assessment/Plan: Problem #1 acute kidney injury superimposed on chronic. Presently his BUN and creatinine is still increasing. Patient however does not have any uremic sign and symptoms. The present increasing BUN and creatinine could be secondary to prerenal/ARBS/ATN/cardiorenal. Problem #2 chronic renal failure stage III. This could be secondary to hypertensive nephrosclerosis versus ischemic. Patient with unequal kidneys right smaller than  left. Problem #3 hypertension: This could be primary versus secondary. Since patient has unequal kidney and high blood pressure renal artery stenosis b rule out. Presently his blood pressure is not controlled. Problem #4 anemia: Was likely secondary to chronic renal failure. Problem #5 ischemic cardiomyopathy. Presently is on  Lasix. Patient is none oliguric. And patient presently is a symptomatic. Problem #6 proteinuria: None nephrotic range. Problem #7 metabolic bone disease: His calcium is range however his phosphorus is slightly high Plan: We'll continue with hydration.           We'll increase his Lasix to 200 mg IV twice a day.           Will start patient on Norvasc 10 mg by mouth daily           We'll do 24-hour urine for protein and immunoelectrophoresis.           We'll check his basic metabolic panel in the morning. If his renal function does not improve                     patient may require hemodialysis. Since patient is stable if there's no improvement we'll send                  him  for tunneled catheter placement    LOS: 2 days   Jaser Fullen S 09/22/2013,8:42 AM

## 2013-09-23 ENCOUNTER — Other Ambulatory Visit (HOSPITAL_COMMUNITY): Payer: Medicare Other

## 2013-09-23 DIAGNOSIS — R0789 Other chest pain: Secondary | ICD-10-CM

## 2013-09-23 DIAGNOSIS — R6889 Other general symptoms and signs: Secondary | ICD-10-CM

## 2013-09-23 LAB — BASIC METABOLIC PANEL
GFR calc Af Amer: 6 mL/min — ABNORMAL LOW (ref >90)
GFR calc non Af Amer: 5 mL/min — ABNORMAL LOW (ref >90)
Potassium: 4.4 mEq/L (ref 3.5–5.1)
Sodium: 139 mEq/L (ref 135–145)

## 2013-09-23 LAB — PTH, INTACT AND CALCIUM
Calcium, Total (PTH): 8.6 mg/dL (ref 8.4–10.5)
PTH: 430.8 pg/mL — ABNORMAL HIGH (ref 14.0–72.0)

## 2013-09-23 MED ORDER — CARVEDILOL 3.125 MG PO TABS
6.2500 mg | ORAL_TABLET | Freq: Two times a day (BID) | ORAL | Status: DC
  Administered 2013-09-23 – 2013-09-27 (×8): 6.25 mg via ORAL
  Filled 2013-09-23 (×8): qty 2

## 2013-09-23 MED ORDER — FUROSEMIDE 10 MG/ML IJ SOLN
160.0000 mg | Freq: Two times a day (BID) | INTRAVENOUS | Status: DC
  Administered 2013-09-23 – 2013-09-26 (×6): 160 mg via INTRAVENOUS
  Filled 2013-09-23 (×6): qty 16

## 2013-09-23 MED ORDER — CARVEDILOL 3.125 MG PO TABS
3.1250 mg | ORAL_TABLET | Freq: Two times a day (BID) | ORAL | Status: DC
  Administered 2013-09-23: 3.125 mg via ORAL
  Filled 2013-09-23: qty 1

## 2013-09-23 NOTE — Progress Notes (Signed)
The patient was seen and examined, and I agree with the assessment and plan as documented above, with modifications as noted below. Patient feeling much better with respect to dyspnea. Upon further questioning, it appears he has been having episodic chest tightness at least once a week, with bouts of nausea when he exerted himself. He has been orthopneic as well. Denies a known h/o MI. Echo revealed EF 35-40% with wall motion abnormalities, indicative of an ischemic cardiomyopathy. As an outpatient, he will need an ischemic workup. Avoid coronary angiography given high propensity for the development of a contrast-induced nephropathy. Renal workup is ongoing. He may require a kidney biopsy in the future. Complement panels pending. Carvedilol increased for better BP control, and HR will need to be monitored. Continue ASA. Would recommend a fasting lipid panel, as he would likely benefit from statin therapy.

## 2013-09-23 NOTE — Progress Notes (Signed)
Pharmacist Heart Failure Core Measure Documentation  Assessment: Alan Hawkins has an EF documented as 35-40% on 09/20/13 by ECHO.  Rationale: Heart failure patients with left ventricular systolic dysfunction (LVSD) and an EF < 40% should be prescribed an angiotensin converting enzyme inhibitor (ACEI) or angiotensin receptor blocker (ARB) at discharge unless a contraindication is documented in the medical record.  This patient is not currently on an ACEI or ARB for HF.  This note is being placed in the record in order to provide documentation that a contraindication to the use of these agents is present for this encounter.  ACE Inhibitor or Angiotensin Receptor Blocker is contraindicated (specify all that apply)  []   ACEI allergy AND ARB allergy []   Angioedema []   Moderate or severe aortic stenosis []   Hyperkalemia []   Hypotension []   Renal artery stenosis [x]   Worsening renal function, preexisting renal disease or dysfunction   Biagio Borg 09/23/2013 10:41 AM

## 2013-09-23 NOTE — Progress Notes (Signed)
Subjective: Interval History: has no complaint of nausea or vomiting. Patient denies any difficulty in breathing.  His appetite is good. Presently patient does not have any complaints.  Objective: Vital signs in last 24 hours: Temp:  [97.9 F (36.6 C)-98.4 F (36.9 C)] 98.3 F (36.8 C) (12/15 0400) Pulse Rate:  [51-75] 53 (12/15 0600) Resp:  [13-31] 13 (12/15 0600) BP: (133-187)/(72-134) 155/81 mmHg (12/15 0600) SpO2:  [95 %-99 %] 95 % (12/15 0600) Weight:  [84.6 kg (186 lb 8.2 oz)] 84.6 kg (186 lb 8.2 oz) (12/15 0500) Weight change: 0.3 kg (10.6 oz)  Intake/Output from previous day: 12/14 0701 - 12/15 0700 In: 3393.1 [I.V.:3269.1; IV Piggyback:124] Out: 2900 [Urine:2900] Intake/Output this shift:    Generally patient is alert in no apparent distress. Neck supple no JVD. Chest decreased breath sounds bilaterally. He does have any rales. Heart exam revealed regular rate and rhythm no murmur Abdomen soft positive bowel sound Extremities no edema.  Lab Results:  Recent Labs  09/20/13 1748 09/22/13 0451  WBC 5.7 6.6  HGB 10.4* 10.3*  HCT 31.6* 30.1*  PLT 265 276   BMET:   Recent Labs  09/22/13 0451 09/23/13 0429  NA 140 139  K 4.4 4.4  CL 104 105  CO2 20 21  GLUCOSE 107* 100*  BUN 66* 65*  CREATININE 8.67* 8.51*  CALCIUM 9.0 8.5   No results found for this basename: PTH,  in the last 72 hours Iron Studies:   Recent Labs  09/21/13 1243  IRON 54  TIBC 241  FERRITIN 349*    Studies/Results: US Renal  09/21/2013   CLINICAL DATA:  Renal failure  EXAM: RENAL/URINARY TRACT ULTRASOUND COMPLETE  COMPARISON:  None.  FINDINGS: Right Kidney:  Length: 10.4 cm. Increased parenchymal echogenicity, suggesting medical renal disease. No hydronephrosis.  Left Kidney:  Length: 11.5 cm. Increased parenchymal echogenicity, suggesting medical renal disease. 1.2 x 1.0 x 1.7 cm upper pole cyst. No hydronephrosis.  Bladder:  Within normal limits.  IMPRESSION: Increased renal  parenchymal echogenicity, suggesting medical renal disease.  1.7 cm left upper pole renal cyst.  No hydronephrosis.   Electronically Signed   By: Julian Hy M.D.   On: 09/21/2013 12:08    I have reviewed the patient's current medications.  Assessment/Plan: Problem #1 acute kidney injury superimposed on chronic. Presently his BUN and creatinine started improving.. Patient however does not have any uremic sign and symptoms. The present increasing BUN and creatinine could be secondary to prerenal/ARBS/ATN/cardiorenal. Problem #2 chronic renal failure stage III. This could be secondary to hypertensive nephrosclerosis versus ischemic. Patient with unequal kidneys right smaller than left. Problem #3 hypertension: This could be primary versus secondary. Since patient has unequal kidney and high blood pressure renal artery stenosis b rule out. Presently his blood pressure is not controlled. Presently it has improved. Problem #4 anemia: Was likely secondary to chronic renal failure. Problem #5 ischemic cardiomyopathy. Presently is on Lasix. Patient is none oliguric. Patient has about 3 L of urine output. That has improved.. Problem #6 proteinuria: None nephrotic range. Problem #7 metabolic bone disease: His calcium is range however his phosphorus is slightly high Plan: We'll continue with hydration.           We'll decrease Lasix to 160 mg IV twice a day           We'll follow his blood work. Presently patient doesn't need dialysis.           We'll check his basic metabolic  panel in the morning. If his renal function does not improve                     patient may require hemodialysis. Since patient is stable if there's no improvement we'll send                  him  for tunneled catheter placement    LOS: 3 days   Jonie Burdell S 09/23/2013,8:06 AM

## 2013-09-23 NOTE — Clinical Documentation Improvement (Signed)
THIS DOCUMENT IS NOT A PERMANENT PART OF THE MEDICAL RECORD  Please update your documentation with the medical record to reflect your response to this query. If you need help knowing how to do this please call [phone].  09/23/13  Dear Dr. Everette Rank Rolley Sims  A review of the patient medical record has revealed the following indicators.  Based on your clinical judgment, please clarify and document in a progress note and/or discharge summary the clinical condition associated with the following supporting information:  I Possible Clinical Conditions?    Accelerated Hypertension Malignant Hypertension Other Condition __________________________ Cannot Clinically Determine   Supporting Information:  Risk Factors: History of Hypertension and Chronic Kidney Disease Stage III Admitted with End Stage Renal Disease   Signs and Symptoms: 12/15 SBP range: 171-133 DBP range:  93- 80 12/14 SBP range: 207 -138 DBP range: 134 - 72 12/13 SBP range : 202 -130 DBP range: 129-68 12/12 SBP range: 216-192 DBP range: 121 - 96 Treatment:  Drips: Nitro IV Medications:  Norvasc 10mg  daily Clonidine 0.1mg  q8h Lopressor 50mg  bid  You may use possible, probable, or suspect with inpatient documentation. Possible, probable, suspected diagnoses MUST be documented at the time of discharge.  Reviewed: additional documentation in the medical record  Thank You,  [place] Documentation Specialist: [place]

## 2013-09-23 NOTE — Care Management Note (Addendum)
    Page 1 of 1   09/27/2013     9:24:56 AM   CARE MANAGEMENT NOTE 09/27/2013  Patient:  Alan Hawkins, Alan Hawkins   Account Number:  [id number]  Date Initiated:  09/23/2013  Documentation initiated by:  Theophilus Kinds  Subjective/Objective Assessment:   Pt admitted from home with CHF and renal failure. Pt lives with his wife and will return home at discharge. Pt is independent with ADL's.     Action/Plan:   IF pt does require dialysis at discharge, CSW is aware and will arrange. No other CM needs noted.   Anticipated DC Date:  09/26/2013   Anticipated DC Plan:  HOME/SELF CARE  In-house referral  Clinical Social Worker      DC Planning Services  CM consult      Choice offered to / List presented to:             Status of service:  Completed, signed off Medicare Important Message given?  YES (If response is "NO", the following Medicare IM given date fields will be blank) Date Medicare IM given:  09/27/2013 Date Additional Medicare IM given:    Discharge Disposition:  HOME/SELF CARE  Per UR Regulation:    If discussed at Long Length of Stay Meetings, dates discussed:   09/26/2013    Comments:  09/27/13 0840 Christinia Gully, RN BSN CM Pt discharged home today. No CM needs noted.  09/23/13 [place], RN BSN CM

## 2013-09-23 NOTE — Progress Notes (Signed)
UR chart review completed.  

## 2013-09-23 NOTE — Progress Notes (Signed)
Consulting cardiologist: Domenic Polite  Subjective:   I am feeling much better. Appetite is back. Wants to get up and walk around. Breathing better.   Objective:   Temp:  [97.9 F (36.6 C)-98.4 F (36.9 C)] 98.3 F (36.8 C) (12/15 0400) Pulse Rate:  [51-75] 53 (12/15 0600) Resp:  [13-31] 13 (12/15 0600) BP: (133-187)/(72-134) 155/81 mmHg (12/15 0600) SpO2:  [95 %-99 %] 95 % (12/15 0600) Weight:  [186 lb 8.2 oz (84.6 kg)] 186 lb 8.2 oz (84.6 kg) (12/15 0500) Last BM Date: 09/21/13  Filed Weights   09/21/13 0522 09/22/13 0500 09/23/13 0500  Weight: 187 lb 4.8 oz (84.959 kg) 185 lb 13.6 oz (84.3 kg) 186 lb 8.2 oz (84.6 kg)    Intake/Output Summary (Last 24 hours) at 09/23/13 0809 Last data filed at 09/23/13 0630  Gross per 24 hour  Intake 3269.08 ml  Output   2900 ml  Net 369.08 ml    Telemetry: NSR rates in the 70's  Exam:  General: No acute distress.  HEENT: Conjunctiva and lids normal, oropharynx clear.  Lungs: Clear to auscultation, nonlabored. Prolonged expiration. No wheezes. No coughing.  Cardiac: No elevated JVP or bruits. RRR, no gallop or rub.   Abdomen: Normoactive bowel sounds, nontender, nondistended.  Extremities: No pitting edema, distal pulses full.  Neuropsychiatric: Alert and oriented x3, affect appropriate.   Lab Results:  Basic Metabolic Panel:  Recent Labs Lab 09/21/13 0428 09/22/13 0451 09/23/13 0429  NA 140 140 139  K 5.1 4.4 4.4  CL 105 104 105  CO2 18* 20 21  GLUCOSE 113* 107* 100*  BUN 67* 66* 65*  CREATININE 8.38* 8.67* 8.51*  CALCIUM 9.2 9.0 8.5    Liver Function Tests:  Recent Labs Lab 09/20/13 1748  AST 11  ALT 11  ALKPHOS 60  BILITOT 0.4  PROT 7.0  ALBUMIN 3.3*    CBC:  Recent Labs Lab 09/20/13 1748 09/22/13 0451  WBC 5.7 6.6  HGB 10.4* 10.3*  HCT 31.6* 30.1*  MCV 93.2 91.5  PLT 265 276    Cardiac Enzymes:  Recent Labs Lab 09/20/13 2239 09/21/13 0427 09/21/13 0958  TROPONINI <0.30 <0.30  <0.30    BNP:  Recent Labs  09/20/13 1748  PROBNP 57268.0*   Radiology: US Renal  09/21/2013   CLINICAL DATA:  Renal failure  EXAM: RENAL/URINARY TRACT ULTRASOUND COMPLETE  COMPARISON:  None.  FINDINGS: Right Kidney:  Length: 10.4 cm. Increased parenchymal echogenicity, suggesting medical renal disease. No hydronephrosis.  Left Kidney:  Length: 11.5 cm. Increased parenchymal echogenicity, suggesting medical renal disease. 1.2 x 1.0 x 1.7 cm upper pole cyst. No hydronephrosis.  Bladder:  Within normal limits.  IMPRESSION: Increased renal parenchymal echogenicity, suggesting medical renal disease.  1.7 cm left upper pole renal cyst.  No hydronephrosis.   Electronically Signed   By: Julian Hy M.D.   On: 09/21/2013 12:08   Echocardiogram: Left ventricle: The cavity size was normal. Wall thickness was increased in a pattern of mild to moderate LVH. Systolic function was moderately reduced. The estimated ejection fraction was in the range of 35% to 40%. There is severe hypokinesis of the mid-distalanterolateral and inferolateral myocardium. Doppler parameters are consistent with abnormal left ventricular relaxation (grade 1 diastolic dysfunction). Doppler parameters are consistent with high ventricular filling pressure. - Aortic valve: Mildly to moderately calcified annulus. Trileaflet. Mild regurgitation. Mean gradient: 12mm Hg (S). - Mitral valve: Mildly calcified annulus. Mild regurgitation. - Left atrium: The atrium was mildly dilated. -  Right atrium: Central venous pressure: 44mm Hg (est). - Tricuspid valve: Mild regurgitation. - Pulmonary arteries: Systolic pressure was moderately increased. PA peak pressure: 67mm Hg (S). - Pericardium, extracardiac: A trivial pericardial effusion was identified. small posteriorly.    Medications:   Scheduled Medications: . amLODipine  10 mg Oral Daily  . aspirin EC  81 mg Oral Daily  . carvedilol  3.125 mg Oral BID WC  . cloNIDine   0.1 mg Oral Q8H  . furosemide  200 mg Intravenous BID  . heparin  5,000 Units Subcutaneous Q8H  . metoprolol tartrate  50 mg Oral BID  . sodium chloride  3 mL Intravenous Q12H     Infusions: . sodium chloride 135 mL/hr at 09/23/13 0400     PRN Medications:  sodium chloride, acetaminophen, ondansetron (ZOFRAN) IV, promethazine, sodium chloride   Assessment and Plan:  1. Systolic CHF:  Seen by Dr. Domenic Polite on Friday at the request of Dr. Everette Rank for echocardiogram. Found to have  systolic dysfunction with EF 35%-40%, with anterolateral and inferolateral wall motion abnormalities c/w ICM. Also evidence of Grade 1 diastolic dysfunction, and pulmonary hypertension. He was started on IV lasix 120 mg BID per nephrololgy. Significant diureses with wt loss of 8 lbs if documentation is correct. Breathing status has improved. ARB and HCTZ has been discontinued due to renal failure. Diuresis per Dr. Hinda Lenis.  2. Chronic Renal Failure-Stage III: He is being followed by Dr. Hinda Lenis with plans to ultrasound kidney's, ongoing diureses, and gentle hydration. Creatinine 8.32 on admission. 24 hour urine in progress. Creatinine  8.51 this am. Consideration for cardiorenal syndrome work-up.   3. Probable ICM: No prior cardiac work-up is noted on review of records. When stable, he will need further evaluation. Continue carvedilol and ASA.  4. Hypertension: Much better controlled currently with hypertension noted on admission. Now on clonidine, amlodipine, and carvedilol. D/C metoprolol as he is on carvedilol, with dose increase to 6.25 mg BID.       Phill Myron. Purcell Nails NP [facility] 09/23/2013, 8:09 AM

## 2013-09-24 LAB — CBC
Hemoglobin: 9.9 g/dL — ABNORMAL LOW (ref 13.0–17.0)
RBC: 3.25 MIL/uL — ABNORMAL LOW (ref 4.22–5.81)
WBC: 5 10*3/uL (ref 4.0–10.5)

## 2013-09-24 LAB — BASIC METABOLIC PANEL
BUN: 62 mg/dL — ABNORMAL HIGH (ref 6–23)
CO2: 19 mEq/L (ref 19–32)
Chloride: 105 mEq/L (ref 96–112)
Glucose, Bld: 102 mg/dL — ABNORMAL HIGH (ref 70–99)
Potassium: 4.2 mEq/L (ref 3.5–5.1)
Sodium: 139 mEq/L (ref 135–145)

## 2013-09-24 LAB — C4 COMPLEMENT: Complement C4, Body Fluid: 40 mg/dL (ref 10–40)

## 2013-09-24 LAB — APTT: aPTT: 30 seconds (ref 24–37)

## 2013-09-24 LAB — PROTIME-INR: Prothrombin Time: 12.9 seconds (ref 11.6–15.2)

## 2013-09-24 LAB — C3 COMPLEMENT: C3 Complement: 130 mg/dL (ref 90–180)

## 2013-09-24 MED ORDER — CLONIDINE HCL 0.2 MG PO TABS
0.2000 mg | ORAL_TABLET | Freq: Three times a day (TID) | ORAL | Status: DC
  Administered 2013-09-24 – 2013-09-27 (×9): 0.2 mg via ORAL
  Filled 2013-09-24 (×9): qty 1

## 2013-09-24 MED ORDER — CEFAZOLIN SODIUM-DEXTROSE 2-3 GM-% IV SOLR
2.0000 g | Freq: Once | INTRAVENOUS | Status: AC
  Administered 2013-09-25: 2 g via INTRAVENOUS
  Filled 2013-09-24: qty 50

## 2013-09-24 NOTE — Progress Notes (Signed)
Alan Hawkins, Alan Hawkins NO.:  [id number]  MEDICAL RECORD NO.:  [id number]  LOCATION:  IC03                          FACILITY:  [facility]  PHYSICIAN:  Jeane Cashatt G. Everette Rank, MD   DATE OF BIRTH:  [birth year]  DATE OF PROCEDURE: DATE OF DISCHARGE:                                PROGRESS NOTE   SUBJECTIVE:  The patient feels more comfortable this a.m., does not have headache or nausea.  He does have acute-on-chronic renal failure stage II with mild pulmonary edema and markedly elevated proBNP and end-stage renal disease.  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  OBJECTIVE:  VITAL SIGNS:  Blood pressure 171/93, respirations 18, pulse 65, temp 98.3. HEENT:  Eyes PERRLA, TM negative.  Oropharynx benign. NECK:  Supple.  No JVD or thyroid abnormalities. LUNGS:  Occasional rhonchus heard over lower lung fields. HEART:  Regular rhythm.  No murmurs. ABDOMEN:  No palpable organs or masses. EXTREMITIES:  Mild edema.  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  He does have essential hypertension and evidence of acute-on-chronic systolic heart failure and underlying ischemic cardiomyopathy.  PLAN:  To continue current regimen with attempting diuresis with furosemide.  The patient will be seen by both Cardiology and Nephrology, and decision will be made concerning dialysis.  We will add Coreg to regimen.  Continue to monitor serum electrolytes.     Alan Million G. Everette Rank, MD     AGM/MEDQ  D:  09/23/2013  T:  09/24/2013  Job:  [id number]

## 2013-09-24 NOTE — Progress Notes (Signed)
RN SPOKE W/ THOMAS AT [facility] AND TRANSFER TO [facility] IN AM. PT TO ARRIVE BY 0830 FOR TUNNELED HD CATHETER PLACEMENT.

## 2013-09-24 NOTE — Progress Notes (Signed)
SUBJECTIVE: Pt is doing well and denies chest pain, shortness of breath, and only had some mild nausea this morning. Ate his breakfast. Has a lot of questions about whether or not to pursue dialysis.     Intake/Output Summary (Last 24 hours) at 09/24/13 0934 Last data filed at 09/24/13 0600  Gross per 24 hour  Intake   3435 ml  Output   2400 ml  Net   1035 ml    Current Facility-Administered Medications  Medication Dose Route Frequency Provider Last Rate Last Dose  . 0.9 %  sodium chloride infusion  250 mL Intravenous PRN Phillips Grout, MD      . 0.9 %  sodium chloride infusion   Intravenous Continuous Harriett Sine, MD 135 mL/hr at 09/24/13 0557    . acetaminophen (TYLENOL) tablet 650 mg  650 mg Oral Q4H PRN Phillips Grout, MD   650 mg at 09/22/13 0542  . amLODipine (NORVASC) tablet 10 mg  10 mg Oral Daily Harriett Sine, MD   10 mg at 09/24/13 0907  . aspirin EC tablet 81 mg  81 mg Oral Daily Phillips Grout, MD   81 mg at 09/24/13 0907  . carvedilol (COREG) tablet 6.25 mg  6.25 mg Oral BID WC Lendon Colonel, NP   6.25 mg at 09/24/13 0745  . cloNIDine (CATAPRES) tablet 0.1 mg  0.1 mg Oral Q8H Angus G McInnis, MD   0.1 mg at 09/24/13 0744  . furosemide (LASIX) 160 mg in dextrose 5 % 50 mL IVPB  160 mg Intravenous BID Harriett Sine, MD   160 mg at 09/24/13 0745  . heparin injection 5,000 Units  5,000 Units Subcutaneous Q8H Phillips Grout, MD   5,000 Units at 09/24/13 0510  . ondansetron (ZOFRAN) 8 mg/NS 50 ml IVPB  8 mg Intravenous Q6H PRN Robert Bellow, MD      . promethazine (PHENERGAN) tablet 25 mg  25 mg Oral Q4H PRN Robert Bellow, MD      . sodium chloride 0.9 % injection 3 mL  3 mL Intravenous Q12H Phillips Grout, MD   3 mL at 09/23/13 1014  . sodium chloride 0.9 % injection 3 mL  3 mL Intravenous PRN Phillips Grout, MD       Facility-Administered Medications Ordered in Other Encounters  Medication Dose Route Frequency Provider Last Rate  Last Dose  . fentaNYL (SUBLIMAZE) injection 25-50 mcg  25-50 mcg Intravenous Q5 min PRN Lerry Liner, MD        Filed Vitals:   09/24/13 0300 09/24/13 0400 09/24/13 0500 09/24/13 0600  BP: 158/85 165/85 149/78 165/78  Pulse:      Temp:      TempSrc:      Resp: 14 15 13 13   Height:      Weight:   187 lb 9.8 oz (85.1 kg)   SpO2:        PHYSICAL EXAM General: NAD Neck: No JVD, no thyromegaly or thyroid nodule.  Lungs: Clear to auscultation bilaterally with normal respiratory effort. CV: Nondisplaced PMI.  Regular rhythm, normal S1/S2, no S3/S4, no murmur.  No pretibial edema.  No carotid bruit.  Normal pedal pulses.  Abdomen: Soft, nontender, no hepatosplenomegaly, no distention.  Neurologic: Alert and oriented x 3.  Psych: Normal affect. Extremities: No clubbing or cyanosis.   TELEMETRY: Reviewed telemetry pt in NS[facility].  LABS: Basic Metabolic Panel:  Recent Labs  09/21/13 1243  09/23/13 0429 09/24/13 0424  NA  --   < > 139 139  K  --   < > 4.4 4.2  CL  --   < > 105 105  CO2  --   < > 21 19  GLUCOSE  --   < > 100* 102*  BUN  --   < > 65* 62*  CREATININE  --   < > 8.51* 8.42*  CALCIUM 8.6  < > 8.5 8.4  PHOS 5.2*  --   --   --   < > = values in this interval not displayed. Liver Function Tests: No results found for this basename: AST, ALT, ALKPHOS, BILITOT, PROT, ALBUMIN,  in the last 72 hours No results found for this basename: LIPASE, AMYLASE,  in the last 72 hours CBC:  Recent Labs  09/22/13 0451 09/24/13 0424  WBC 6.6 5.0  HGB 10.3* 9.9*  HCT 30.1* 30.0*  MCV 91.5 92.3  PLT 276 256   Cardiac Enzymes:  Recent Labs  09/21/13 0958  TROPONINI <0.30   BNP: No components found with this basename: POCBNP,  D-Dimer: No results found for this basename: DDIMER,  in the last 72 hours Hemoglobin A1C: No results found for this basename: HGBA1C,  in the last 72 hours Fasting Lipid Panel: No results found for this basename: CHOL, HDL, LDLCALC, TRIG, CHOLHDL,  LDLDIRECT,  in the last 72 hours Thyroid Function Tests: No results found for this basename: TSH, T4TOTAL, FREET3, T3FREE, THYROIDAB,  in the last 72 hours Anemia Panel:  Recent Labs  09/21/13 1243  FERRITIN 349*  TIBC 241  IRON 54    RADIOLOGY: Dg Chest 2 View  09/20/2013   CLINICAL DATA:  Shortness of breath.  Nausea.  EXAM: CHEST  2 VIEW  COMPARISON:  09/17/2013  FINDINGS: Mild cardiomegaly is stable.  Changes of COPD again noted.  Bibasilar pulmonary opacity is again seen which is unchanged and is of uncertain chronicity. While this may represent pleural-parenchymal scarring. Some component of active infiltrate cannot be excluded.  In addition, there is a nodular density in the right upper lung field measuring approximately 2 cm  IMPRESSION: Stable bibasilar pulmonary opacity, which is of uncertain chronicity. Wall this may represent pleural-parenchymal scarring, some component of active infiltrate cannot be excluded.  Right upper lobe nodular density. Chest CT recommended to exclude pulmonary neoplasm.  Stable mild cardiomegaly and COPD.   Electronically Signed   By: Earle Gell M.D.   On: 09/20/2013 19:28   Dg Chest 2 View  09/17/2013   CLINICAL DATA:  Cough, shortness of breath, nausea.  EXAM: CHEST  2 VIEW  COMPARISON:  None.  FINDINGS: There is moderate enlargement of the cardiac silhouette. The patient has taken a full inspiration. There are patchy and linear opacities in both lung bases, right greater than left. No definite pleural effusion is identified. No acute osseous abnormality is seen.  IMPRESSION: Cardiomegaly and bibasilar opacities, which may reflect pulmonary edema or infection.   Electronically Signed   By: Logan Bores   On: 09/17/2013 15:18   US Renal  09/21/2013   CLINICAL DATA:  Renal failure  EXAM: RENAL/URINARY TRACT ULTRASOUND COMPLETE  COMPARISON:  None.  FINDINGS: Right Kidney:  Length: 10.4 cm. Increased parenchymal echogenicity, suggesting medical renal  disease. No hydronephrosis.  Left Kidney:  Length: 11.5 cm. Increased parenchymal echogenicity, suggesting medical renal disease. 1.2 x 1.0 x 1.7 cm upper pole cyst. No hydronephrosis.  Bladder:  Within normal limits.  IMPRESSION: Increased renal parenchymal echogenicity, suggesting medical renal disease.  1.7 cm left upper pole renal cyst.  No hydronephrosis.   Electronically Signed   By: Julian Hy M.D.   On: 09/21/2013 12:08      ASSESSMENT AND PLAN: 1. Acute systolic heart failure/ischemic cardiomyopathy: Patient feeling much better with respect to dyspnea. Upon further questioning, it appears he has been having episodic chest tightness at least once a week, with bouts of nausea when he exerted himself. He has been orthopneic as well. Denies a known h/o MI. Echo revealed EF 35-40% with wall motion abnormalities, indicative of an ischemic cardiomyopathy. As an outpatient, he will need an ischemic workup. Avoid coronary angiography given high propensity for the development of a contrast-induced nephropathy. However, if he follows through with dialysis, this will not be an issue.  Continue ASA. Would recommend a fasting lipid panel, as he would likely benefit from statin therapy. Diuresis being managed by nephrology. 2. Acute renal failure: Renal workup is ongoing. He may require a kidney biopsy in the future. I have advised him to follow through with nephrology's recommendations regarding dialysis, and informed him of the numerous deleterious effects of untreated renal failure. Complement panels pending. Carvedilol increased for better BP control, and HR will need to be monitored. Continue ASA. Would recommend a fasting lipid panel, as he would likely benefit from statin therapy. 3. Hypertension: still not optimally controlled, and is a function of unresolving renal failure. He will likely require dialysis as function has not improved. Carvedilol at 6.25 mg bid with HR in 50-60 bpm range, thus  unable to increase. Amlodipine currently at 10 mg daily. I will increase clonidine to 0.2 mg q 8 hours.  Dispo: I have arranged for pt to f/u in our clinic for further management. Diuresis being managed by nephrology. No further recommendations.   Kate Sable, M.D., F.A.C.C.

## 2013-09-24 NOTE — Progress Notes (Signed)
Subjective: Interval History: has no complaint of nausea or vomiting. Patient denies any difficulty in breathing.  His appetite is good. Presently patient does not have any complaints.  Objective: Vital signs in last 24 hours: Temp:  [97.5 F (36.4 C)-98 F (36.7 C)] 98 F (36.7 C) (12/15 2005) Pulse Rate:  [55-61] 56 (12/15 1600) Resp:  [13-21] 13 (12/16 0600) BP: (116-188)/(57-89) 165/78 mmHg (12/16 0600) SpO2:  [95 %-100 %] 99 % (12/15 1600) Weight:  [85.1 kg (187 lb 9.8 oz)] 85.1 kg (187 lb 9.8 oz) (12/16 0500) Weight change: 0.5 kg (1 lb 1.6 oz)  Intake/Output from previous day: 12/15 0701 - 12/16 0700 In: 3945 [P.O.:840; I.V.:3105] Out: 2800 [Urine:2800] Intake/Output this shift:    Generally patient is alert in no apparent distress. Neck supple no JVD. Chest decreased breath sounds bilaterally. He does have any rales. Heart exam revealed regular rate and rhythm no murmur Abdomen soft positive bowel sound Extremities no edema.  Lab Results:  Recent Labs  09/22/13 0451 09/24/13 0424  WBC 6.6 5.0  HGB 10.3* 9.9*  HCT 30.1* 30.0*  PLT 276 256   BMET:   Recent Labs  09/23/13 0429 09/24/13 0424  NA 139 139  K 4.4 4.2  CL 105 105  CO2 21 19  GLUCOSE 100* 102*  BUN 65* 62*  CREATININE 8.51* 8.42*  CALCIUM 8.5 8.4    Recent Labs  09/21/13 1243  PTH 430.8*   Iron Studies:   Recent Labs  09/21/13 1243  IRON 54  TIBC 241  FERRITIN 349*    Studies/Results: No results found.  I have reviewed the patient's current medications.  Assessment/Plan: Problem #1 acute kidney injury superimposed on chronic. Presently his BUN and creatinine is stable with out significant change. Patient however does not have any uremic sign and symptoms. The present increasing BUN and creatinine could be secondary to prerenal/ARBS/ATN/cardiorenal. Problem #2 chronic renal failure stage III. This could be secondary to hypertensive nephrosclerosis versus ischemic. Patient  with unequal kidneys right smaller than left. Problem #3 hypertension: This could be primary versus secondary. Since patient has unequal kidney and high blood pressure renal artery stenosis b rule out. Presently his blood pressure is not controlled. Presently it has improved. Problem #4 anemia: Was likely secondary to chronic renal failure. Problem #5 ischemic cardiomyopathy. Presently is on Lasix. Patient is none oliguric. Patient has about 3 L of urine output. That has improved.. Problem #6 proteinuria: None nephrotic range. Problem #7 metabolic bone disease: His calcium is range however his phosphorus is slightly high Plan: We'll continue with hydration.           We'll decrease Lasix to 160 mg IV twice a day           Since patient renal function did not show significant change I discussed with him about initiating dialysis. Presently patient wants to think about it. Presently no urgent reason for dialysis. If patient wants to wait possibly we will discharge him and repeat blood work in a week and if no improvement then consider dialysis is also a reasonable option speccialy if no further work up is needed as far as his  Cardiac problem is concerend   LOS: 4 days   Lemma Tetro S 09/24/2013,7:17 AM

## 2013-09-24 NOTE — Progress Notes (Signed)
Patient ID: Alan Hawkins, male   DOB: 1941-09-18, 72 y.o.   MRN: [id number]   Pt with chronic renal failure stage III  To ER for SOB; x 1 month; HTN Found to have worsening renal failure  Followed by Cardiology CHF; ischemic cardiomyopthy Followed by Nephrology for worsening renal fxn Bun/Cr: 62/8.42  To begin dialysis asap.  Scheduled for tunneled HD cath at [facility] 12/17  See orders

## 2013-09-25 LAB — UIFE/LIGHT CHAINS/TP QN, 24-HR UR
Albumin, U: DETECTED
Alpha 1, Urine: DETECTED — AB
Free Kappa/Lambda Ratio: 4.93 ratio (ref 2.04–10.37)
Gamma Globulin, Urine: DETECTED — AB
Total Protein, Urine: 47.4 mg/dL

## 2013-09-25 LAB — BASIC METABOLIC PANEL
BUN: 58 mg/dL — ABNORMAL HIGH (ref 6–23)
CO2: 20 mEq/L (ref 19–32)
Calcium: 8.5 mg/dL (ref 8.4–10.5)
Creatinine, Ser: 7.82 mg/dL — ABNORMAL HIGH (ref 0.50–1.35)
GFR calc non Af Amer: 6 mL/min — ABNORMAL LOW (ref >90)
Glucose, Bld: 103 mg/dL — ABNORMAL HIGH (ref 70–99)
Sodium: 138 mEq/L (ref 135–145)

## 2013-09-25 NOTE — Progress Notes (Signed)
Nutrition Education Note  RD consulted for Renal Education by nutrition services. Pt spouse requesting Renal diet related materials. Usual diet Regular. Reviewed food groups and provided written recommended serving sizes specifically determined for patient's current nutritional status.  Explained why diet restrictions are needed and provided lists of foods to limit/avoid that are high potassium, sodium, and phosphorus. Provided specific recommendations on safer alternatives of these foods. Strongly encouraged compliance of this diet. Expect good compliance. Body mass index is 28.12 kg/(m^2). Pt meets criteria for overweight based on current BMI. Current diet order is Renal 80/90-11-11-1198 ml fluid, patient is consuming approximately 50% of meals at this time. Labs and medications reviewed. No further nutrition interventions warranted at this time. RD contact information provided. If additional nutrition issues arise, please re-consult RD.  Colman Cater MS,RD,CSG,LDN Office: [phone] Pager: [phone]

## 2013-09-25 NOTE — Progress Notes (Signed)
NAMESERAFIN, GEESLIN NO.:  [id number]  MEDICAL RECORD NO.:  [id number]  LOCATION:  IC03                          FACILITY:  [facility]  PHYSICIAN:  Zanaiya Calabria G. Everette Rank, MD   DATE OF BIRTH:  1941/07/26  DATE OF PROCEDURE:  09/24/2013 DATE OF DISCHARGE:                                PROGRESS NOTE   SUBJECTIVE:  This patient had a fairly comfortable night.  Does not have headache or nausea, has been producing a considerable quantity of urine. He does have acute on chronic renal failure, stage III, with mild pulmonary edema and elevated proBNP.  Does have a history of hypertension, acute on chronic systolic heart failure with underlying ischemic cardiomyopathy.  OBJECTIVE:  VITAL SIGNS:  Blood pressure 165/78, respirations 13, pulse 61, temp 98. HEENT:  Eyes PERRLA, TM negative, oropharynx benign. NECK:  Supple.  No JVD or thyroid abnormalities. LUNGS:  Clear to P and A. HEART:  Regular rhythm.  No murmurs. ABDOMEN:  No palpable organs or masses. EXTREMITIES:  Free of edema.  ASSESSMENT:  The patient was admitted with acute on chronic renal failure, stage IV with mild pulmonary edema.  He does have essential hypertension and evidence of acute on chronic systolic heart failure, and underlying ischemic cardiomyopathy.  PLAN:  To continue diuresis with increased dose of furosemide, to continue to monitor chemistries.  We have added on Coreg to regimen. Renal ultrasound was completed which showed increased renal echogenicity suggesting medical renal disease.  Nephrology will determine whether or not the patient will need dialysis.     Damascus Feldpausch G. Everette Rank, MD     AGM/MEDQ  D:  09/24/2013  T:  09/25/2013  Job:  [id number]

## 2013-09-25 NOTE — Progress Notes (Signed)
Alan Hawkins  MRN: [id number]  DOB/AGE: Apr 26, 1941 72 y.o.  Primary Care Physician:Alan G, MD  Admit date: 09/20/2013  Chief Complaint:  Chief Complaint  Patient presents with  . Shortness of Breath    S-Pt presented on  09/20/2013 with  Chief Complaint  Patient presents with  . Shortness of Breath  .    Pt today feels better   . amLODipine  10 mg Oral Daily  . aspirin EC  81 mg Oral Daily  . carvedilol  6.25 mg Oral BID WC  . cloNIDine  0.2 mg Oral Q8H  . furosemide  160 mg Intravenous BID  . heparin  5,000 Units Subcutaneous Q8H  . sodium chloride  3 mL Intravenous Q12H      Physical Exam: Vital signs in last 24 hours: Temp:  [97.4 F (36.3 C)-98.1 F (36.7 C)] 98 F (36.7 C) (12/17 0400) Resp:  [12-17] 13 (12/17 0800) BP: (123-169)/(62-89) 159/80 mmHg (12/17 0800) Weight:  [195 lb 15.8 oz (88.9 kg)] 195 lb 15.8 oz (88.9 kg) (12/17 0500) Weight change: 8 lb 6 oz (3.8 kg) Last BM Date: 09/24/13  Intake/Output from previous day: 12/16 0701 - 12/17 0700 In: 4436 [P.O.:1080; I.V.:3240; IV Piggyback:116] Out: 4100 [Urine:4100] Total I/O In: 185 [I.V.:135; IV Piggyback:50] Out: 250 [Urine:250]   Physical Exam: General- pt is awake,alert, oriented to time place and person Resp- No acute REsp distress, CTA B/L NO Rhonchi CVS- S1S2 regular in rate and rhythm GIT- BS+, soft, NT, ND EXT- NO LE Edema, Cyanosis   Lab Results: CBC  Recent Labs  09/24/13 0424  WBC 5.0  HGB 9.9*  HCT 30.0*  PLT 256    BMET  Recent Labs  09/24/13 0424 09/25/13 0441  NA 139 138  K 4.2 4.3  CL 105 105  CO2 19 20  GLUCOSE 102* 103*  BUN 62* 58*  CREATININE 8.42* 7.82*  CALCIUM 8.4 8.5   Trend Creat 8.42==>7.82  MICRO Recent Results (from the past 240 hour(s))  MRSA PCR SCREENING     Status: None   Collection Time    09/20/13 10:00 PM      Result Value Range Status   MRSA by PCR NEGATIVE  NEGATIVE Final   Comment:            The GeneXpert MRSA  Assay (FDA     approved for NASAL specimens     only), is one component of a     comprehensive MRSA colonization     surveillance program. It is not     intended to diagnose MRSA     infection nor to guide or     monitor treatment for     MRSA infections.      Lab Results  Component Value Date   PTH 430.8* 09/21/2013   CALCIUM 8.5 09/25/2013   PHOS 5.2* 09/21/2013       Impression: 1)Renal  AKI secondary to ATN                AKI on CKD               AKI slightly better               CKD stage 3 now progressed to CKD stage 5?.               CKD since - not sure as not much data  CKD secondary to HTN/ Ischemic                 Nephrolithiasis Hx Absent                Pt was  to have access placed today but as there is some improvement in Creat will defer for now.  2)HTN Target Organ damage  CKD CHF Medication-  On Diuretics On Calcium Channel Blockers On Alpha and beta Blockers  On Central Acting Sympatholytics- Clonidine   3)Anemia HGb at goal (9--11)   4)CKD Mineral-Bone Disorder PTH elevated. Secondary Hyperparathyroidism  present . Phosphorus at goal.   5)CHF-admitted with Acute CHF Cardiology and  Primary MD following  6)FEN  Normokalemic NOrmonatremic   7)Acid base Co2 at goal     Plan:  Will defer  permacath placement for now. Will follow bmet . Pt may assume diet as pt was NPO for access placement      Alan Hawkins S 09/25/2013, 8:49 AM

## 2013-09-25 NOTE — Progress Notes (Signed)
Spoke to Dr Karie Kirks who is on call for [facility]. Nephrology stated it was ok from stand point that pt transfers to the floor. Order received to transfer pt.

## 2013-09-25 NOTE — Progress Notes (Signed)
Spoke to the IR PA at [facility] and Dr Lowanda Foster. Per their order hold off on Dialysis catheter placement today. Pt and pts wife updated. Will continue to monitor.

## 2013-09-25 NOTE — Progress Notes (Signed)
Pt to be transferred per MD order. Report called to RN. Pt transferred via wheelchair with personal belongings. Pts wife called and made aware that pt was being transferred.

## 2013-09-26 LAB — BASIC METABOLIC PANEL
BUN: 57 mg/dL — ABNORMAL HIGH (ref 6–23)
CO2: 20 mEq/L (ref 19–32)
Calcium: 8.5 mg/dL (ref 8.4–10.5)
Chloride: 105 mEq/L (ref 96–112)
Creatinine, Ser: 7.43 mg/dL — ABNORMAL HIGH (ref 0.50–1.35)
GFR calc Af Amer: 7 mL/min — ABNORMAL LOW (ref >90)
GFR calc non Af Amer: 6 mL/min — ABNORMAL LOW (ref >90)
Glucose, Bld: 103 mg/dL — ABNORMAL HIGH (ref 70–99)
Potassium: 4.2 mEq/L (ref 3.5–5.1)
Sodium: 138 mEq/L (ref 135–145)

## 2013-09-26 LAB — CBC
HCT: 29.5 % — ABNORMAL LOW (ref 39.0–52.0)
Hemoglobin: 9.8 g/dL — ABNORMAL LOW (ref 13.0–17.0)
MCH: 30.5 pg (ref 26.0–34.0)
MCHC: 33.2 g/dL (ref 30.0–36.0)
MCV: 91.9 fL (ref 78.0–100.0)
Platelets: 247 10*3/uL (ref 150–400)
RBC: 3.21 MIL/uL — ABNORMAL LOW (ref 4.22–5.81)
RDW: 13.7 % (ref 11.5–15.5)
WBC: 4.5 10*3/uL (ref 4.0–10.5)

## 2013-09-26 MED ORDER — CALCITRIOL 0.25 MCG PO CAPS
0.5000 ug | ORAL_CAPSULE | Freq: Every day | ORAL | Status: DC
  Administered 2013-09-26: 0.5 ug via ORAL
  Filled 2013-09-26: qty 2

## 2013-09-26 MED ORDER — TORSEMIDE 20 MG PO TABS: 40.0000 mg | ORAL_TABLET | Freq: Every day | ORAL | Status: DC

## 2013-09-26 NOTE — Progress Notes (Signed)
NAMEHORACIO, PALLER NO.:  [id number]  MEDICAL RECORD NO.:  [id number]  LOCATION:  [id number]                          FACILITY:  [facility]  PHYSICIAN:  Mavin Dyke G. Everette Rank, MD   DATE OF BIRTH:  03/01/1941  DATE OF PROCEDURE: DATE OF DISCHARGE:                                PROGRESS NOTE   SUBJECTIVE:  This patient had a fairly comfortable evening with no headache or nausea.  He does have acute on chronic renal failure stage III with recent mild pulmonary edema and markedly elevated proBNP.  He does have hypertension, acute on chronic systolic heart failure with underlying ischemic cardiomyopathy.  He is scheduled today for tunneling procedure in preparation for dialysis, this will be done in [place].  OBJECTIVE:  VITAL SIGNS:  Blood pressure 158/80, respirations 14, pulse 65, temp is 98. HEENT:  Eyes, PERRLA.  TM, negative.  Oropharynx, benign. NECK:  Supple.  No JVD or thyroid abnormalities. LUNGS:  Clear to P and A. HEART:  Regular rhythm.  No murmurs. ABDOMEN:  No palpable organs or masses. EXTREMITIES:  Free of edema.  ASSESSMENT:  The patient has acute on chronic renal failure stage 3-4, with mild pulmonary edema, essential hypertension, acute on chronic systolic heart failure, with ischemic cardiomyopathy.  PLAN:  To proceed with it procedures today in [place], preparing him for dialysis.  Continue to monitor serum electrolytes.  Continue diuresis.  Continue to monitor blood pressure.  Clonidine dosage has been changed to 0.2 mg every 8 hours, Coreg dosage 6.25 mg b.i.d., amlodipine 10 mg daily.     Icess Bertoni G. Everette Rank, MD     AGM/MEDQ  D:  09/25/2013  T:  09/26/2013  Job:  [id number]

## 2013-09-26 NOTE — Clinical Social Work Note (Signed)
CSW signing off at this time as patient does not need outpatient dialysis set up.  Can stand by to assist if reconsulted.  Edwyna Shell, LCSW Clinical Social Worker [phone])

## 2013-09-26 NOTE — Progress Notes (Addendum)
Subjective: Interval History: has no complaint of nausea or vomiting. Patient denies any difficulty in breathing.  His appetite is good. Presently patient does not have any complaints.  Objective: Vital signs in last 24 hours: Temp:  [97.3 F (36.3 C)-97.4 F (36.3 C)] 97.4 F (36.3 C) (12/18 0500) Pulse Rate:  [55-66] 66 (12/18 0500) Resp:  [13-20] 20 (12/18 0500) BP: (140-155)/(73-84) 143/74 mmHg (12/18 0500) SpO2:  [98 %-100 %] 99 % (12/18 0500) Weight:  [85.639 kg (188 lb 12.8 oz)] 85.639 kg (188 lb 12.8 oz) (12/18 0500) Weight change: -3.261 kg (-7 lb 3 oz)  Intake/Output from previous day: 12/17 0701 - 12/18 0700 In: 810 [P.O.:220; I.V.:540; IV Piggyback:50] Out: [id number] [Urine:3725] Intake/Output this shift:    Generally patient is alert in no apparent distress. Neck supple no JVD. Chest decreased breath sounds bilaterally. He does have any rales. Heart exam revealed regular rate and rhythm no murmur Abdomen soft positive bowel sound Extremities no edema.  Lab Results:  Recent Labs  09/24/13 0424 09/26/13 0545  WBC 5.0 4.5  HGB 9.9* 9.8*  HCT 30.0* 29.5*  PLT 256 247   BMET:   Recent Labs  09/25/13 0441 09/26/13 0545  NA 138 138  K 4.3 4.2  CL 105 105  CO2 20 20  GLUCOSE 103* 103*  BUN 58* 57*  CREATININE 7.82* 7.43*  CALCIUM 8.5 8.5   No results found for this basename: PTH,  in the last 72 hours Iron Studies:  No results found for this basename: IRON, TIBC, TRANSFERRIN, FERRITIN,  in the last 72 hours  Studies/Results: No results found.  I have reviewed the patient's current medications.  Assessment/Plan: Problem #1 acute kidney injury superimposed on chronic. Presently his BUN and creatinine is improving very slowly. Patient does not have any uremic sinus symptoms. Problem #2 chronic renal failure stage III. This could be secondary to hypertensive nephrosclerosis versus ischemic. Patient with unequal kidneys right smaller than left. Problem  #3 hypertension: This could be primary versus secondary. Since patient has unequal kidney and high blood pressure renal artery stenosis b rule out. Presently his blood pressure is not controlled. Presently it has improved. Problem #4 anemia: Was likely secondary to chronic renal failure. Problem #5 ischemic cardiomyopathy. Presently is on Lasix. Patient is none oliguric. Patient has about 3.7L of urine output. That has improved.. Problem #6 proteinuria: None nephrotic range. Problem #7 metabolic bone disease: His calcium is range however his phosphorus is slightly high. His PTH is also high. Plan: We'll continue with hydration.           We'll DC IV Lasix           With the patient in a week's period and check his basic metabolic panel and make a decision about dialysis. Presently patient doesn't require any urgent dialysis.          We'll start patient on Demadex 40 mg once a day.           Will start patient on Rocaltrol 0.5 mcg by mouth once a day.   LOS: 6 days   Alan Hawkins S 09/26/2013,8:03 AM

## 2013-09-27 ENCOUNTER — Other Ambulatory Visit (HOSPITAL_COMMUNITY): Payer: Medicare Other

## 2013-09-27 LAB — BASIC METABOLIC PANEL
Calcium: 8.9 mg/dL (ref 8.4–10.5)
Chloride: 106 mEq/L (ref 96–112)
GFR calc Af Amer: 8 mL/min — ABNORMAL LOW (ref >90)
GFR calc non Af Amer: 7 mL/min — ABNORMAL LOW (ref >90)
Glucose, Bld: 104 mg/dL — ABNORMAL HIGH (ref 70–99)
Potassium: 4 mEq/L (ref 3.5–5.1)
Sodium: 139 mEq/L (ref 135–145)

## 2013-09-27 MED ORDER — CLONIDINE HCL 0.2 MG PO TABS: 0.2000 mg | ORAL_TABLET | Freq: Two times a day (BID) | ORAL | Status: DC

## 2013-09-27 MED ORDER — CALCITRIOL 0.5 MCG PO CAPS: 0.5000 ug | ORAL_CAPSULE | Freq: Every day | ORAL | Status: DC

## 2013-09-27 MED ORDER — TORSEMIDE 20 MG PO TABS: 40.0000 mg | ORAL_TABLET | Freq: Every day | ORAL | Status: DC

## 2013-09-27 MED ORDER — CARVEDILOL 6.25 MG PO TABS: 6.2500 mg | ORAL_TABLET | Freq: Two times a day (BID) | ORAL | Status: DC

## 2013-09-27 MED ORDER — AMLODIPINE BESYLATE 10 MG PO TABS: 10.0000 mg | ORAL_TABLET | Freq: Every day | ORAL | Status: DC

## 2013-09-27 NOTE — Progress Notes (Signed)
Alan Hawkins, ROSENDAHL NO.:  [id number]  MEDICAL RECORD NO.:  [id number]  LOCATION:  [id number]                          FACILITY:  [facility]  PHYSICIAN:  Ondra Deboard G. Everette Rank, MD   DATE OF BIRTH:  [birth year]  DATE OF PROCEDURE:  09/26/2013 DATE OF DISCHARGE:                                PROGRESS NOTE   SUBJECTIVE:  This patient had a fairly comfortable night.  He remains on furosemide.  His chemistries are being monitored.  He does have acute on chronic systolic heart failure with underlying ischemic cardiomyopathy. His creatinine level has come down some.  OBJECTIVE:  VITAL SIGNS:  Blood pressure 143/74, respirations 20, pulse 66, temp 97.4. HEENT:  Eyes PERRLA.  TMs negative.  Oropharynx benign. NECK:  Supple.  No JVD or thyroid abnormalities. LUNGS:  Clear to P and A. HEART:  Regular rhythm.  No murmurs. ABDOMEN:  No palpable organs or masses. EXTREMITIES:  Free of edema.  ASSESSMENT:  The patient has acute on chronic renal failure, stage IV with mild pulmonary edema, essential hypertension, and acute on chronic systolic heart failure with underlying ischemic cardiomyopathy.  PLAN:  To continue diuresis, continue increase dosages of furosemide and Coreg.  Nephrology will determine whether or not the patient will need dialysis at the moment, preparation for this is on hold as he is being hydrated.     Nuh Lipton G. Everette Rank, MD     AGM/MEDQ  D:  09/26/2013  T:  09/27/2013  Job:  [id number]

## 2013-09-27 NOTE — Progress Notes (Signed)
Subjective: Interval History: . Patient denies any difficulty in breathing.  His appetite is good. Presently patient does not have any complaints. Oever all patient is feeling better  Objective: Vital signs in last 24 hours: Temp:  [96.9 F (36.1 C)-98.3 F (36.8 C)] 97.3 F (36.3 C) (12/19 0500) Pulse Rate:  [58-70] 70 (12/19 0500) Resp:  [17-20] 17 (12/19 0500) BP: (124-164)/(64-96) 155/96 mmHg (12/19 0500) SpO2:  [97 %-100 %] 100 % (12/19 0500) Weight change:   Intake/Output from previous day: 12/18 0701 - 12/19 0700 In: 6363.8 [P.O.:480; I.V.:5883.8] Out: 3100 [Urine:3100] Intake/Output this shift:    Generally patient is alert in no apparent distress. Neck supple no JVD. Chest decreased breath sounds bilaterally. He does have any rales. Heart exam revealed regular rate and rhythm no murmur Abdomen soft positive bowel sound Extremities no edema.  Lab Results:  Recent Labs  09/26/13 0545  WBC 4.5  HGB 9.8*  HCT 29.5*  PLT 247   BMET:   Recent Labs  09/25/13 0441 09/26/13 0545  NA 138 138  K 4.3 4.2  CL 105 105  CO2 20 20  GLUCOSE 103* 103*  BUN 58* 57*  CREATININE 7.82* 7.43*  CALCIUM 8.5 8.5   No results found for this basename: PTH,  in the last 72 hours Iron Studies:  No results found for this basename: IRON, TIBC, TRANSFERRIN, FERRITIN,  in the last 72 hours  Studies/Results: No results found.  I have reviewed the patient's current medications.  Assessment/Plan: Problem #1 acute kidney injury superimposed on chronic. Presently his BUN and creatinine is improving very slowly. Patient does not have any uremic sinus symptoms. His blood work is pending from this morning Problem #2 chronic renal failure stage III. This could be secondary to hypertensive nephrosclerosis versus ischemic. Patient with unequal kidneys right smaller than left. Problem #3 hypertension: This could be primary versus secondary. Since patient has unequal kidney and high blood  pressure renal artery stenosis b rule out. Presently his blood pressure is better controlled. Presently it has improved. Problem #4 anemia: Was likely secondary to chronic renal failure. Problem #5 ischemic cardiomyopathy. Presently is on Lasix. Patient is none oliguric. Patient has about 3.7L of urine output. That has improved.. Problem #6 proteinuria: None nephrotic range. Patein with elevated urine Kappa and Lambda Chain but the ratio is normal and no M spike. Poyclonal Problem #7 metabolic bone disease: His calcium is range however his phosphorus is slightly high. His PTH is also high. Plan: We'll see patient in Tuesday and repeat his blood work           .   LOS: 7 days   Tesha Archambeau S 09/27/2013,7:48 AM

## 2013-09-27 NOTE — Progress Notes (Signed)
AVS reviewed with patient.  Prescriptions provided to patient.  Patient verbalized understanding of discharge instructions, physician follow-up (Cardiology, [place] and [place]) and medications.  Patient educated on heart failure.  Heart failure education materials provided to patient (green, yellow, red zone magnet, daily weights, when to contact the doctor, when to call 911, etc.).  Patient and patient's wife verbalized understanding of heart failure education.  Pt's IV removed.  Site WNL.  Patient transported by NT via w/c to main entrance for discharge.  Patient reports all belongings intact and in possession at time of discharge.  Patient has no c/o pain.  Patient stable at time of discharge.

## 2013-09-28 NOTE — Discharge Summary (Signed)
NAMERHOEN, LANGILL NO.:  [id number]  MEDICAL RECORD NO.:  [id number]  LOCATION:  [id number]                          FACILITY:  [facility]  PHYSICIAN:  Jacinda Kanady G. Everette Rank, MD   DATE OF BIRTH:  Aug 29, 1941  DATE OF ADMISSION:  09/20/2013 DATE OF DISCHARGE:  12/19/2014LH                              DISCHARGE SUMMARY   Seven days hospitalization.  DIAGNOSES: 1. Acute-on-chronic renal failure. 2. Acute-on-chronic systolic heart failure with mild pulmonary edema. 3. Ischemic cardiomyopathy. 4. Essential hypertension. 5. Hyperlipidemia.  CONDITION:  Stable and improved at the time of his discharge.  HISTORY OF PRESENT ILLNESS:  This 72 year old male with shortness of breath over a month, presented to the emergency department in this fashion.  He was seen initially by primary care physician, and had worsening renal failure with markedly elevated proBNP and creatinine. At the time of his admission, he was started on Lasix.  He was still making some urine.  PHYSICAL EXAMINATION:  VITAL SIGNS:  Blood pressure 210/110, pulse 62, respiration 18, temp 98.2. HEENT:  Eyes, PERRLA.  TM negative.  Oropharynx benign. NECK:  Supple.  No JVD or thyroid abnormalities. HEART:  Regular rhythm.  No murmurs.  No cardiomegaly. LUNGS:  Clear to P and A. ABDOMEN:  No palpable organs or masses.  No organomegaly. EXTREMITIES:  Free of edema. SKIN:  Normal. NEUROLOGIC:  Grossly normal.  LABORATORY DATA:  Admission lab data; sodium 140, potassium 5, chloride 104, CO2 of 19, glucose 94, BUN 67, creatinine 8.32, calcium 9.1, AST 11, ALT 11, alkaline phosphatase 60, bilirubin 0.4, albumin 3.3.  CBC; WBC 5700, with hemoglobin 10.4, hematocrit 31.6.  Troponin less than 0.3.  His proBNP was markedly abnormal 57,268.  Subsequent labs, on September 26, 2013; sodium 138, potassium 4.2, chloride 104, CO2 of 20, glucose 103, BUN 57, creatinine 7.43, calcium 8.5, GFR 6.  Prothrombin time 0.99.  CBC on  December 16; WBC 5, hemoglobin 9.9, hematocrit 30. Vitamin D level 25.  Hepatitis C antibody, HCV AB was negative.  RADIOLOGY:  Chest x-ray on admission, stable bibasilar pulmonary opacity, stable mild cardiomegaly, and COPD.  Renal ultrasound, increased renal parenchymal echogenicity, suggesting medical renal disease.  Subsequent chest x-ray stable, bibasilar pulmonary opacity, right pulmonary upper lobe nodular density.  Chest CT recommended to exclude pulmonary neoplasm.  HOSPITAL COURSE:  Initially, the patient was placed on 0.9% sodium chloride infusion and on nitroglycerin drip to help with blood pressure. On nitroglycerin drip, the patient had severe headache and nausea, and had to be changed to oral medications.  He was changed to IV furosemide, was continued on amlodipine 10 mg daily, aspirin 81 mg daily, Rocaltrol capsule 0.5 mcg daily.  He was also placed on Coreg 6.25 mg b.i.d., Catapres initially 0.1 mg every 8 hours, but this was moved up to 0.2 mg every 8 hours.  He began to diurese.  His creatinine slowly improved. His most recent creatinine was 7.43 with a BUN 57, hemoglobin 9.8, hematocrit 29.5.  The patient did not have symptoms of uremia during his hospital stay.  He was seen in consultation by Nephrology Dr. Lowanda Foster who considered getting him ready for dialysis.  However this was postponed and he continued to diurese with slow improvement with reference to his renal failure.  He was felt in addition to have acute- on-chronic systolic heart failure with mild pulmonary edema.  This was gradually improved some with diuresis.  He was set up for a trip to [facility] to prepare for dialysis, however, this was postponed and felt that he could be discharged on oral agents to control blood pressure and to continue to help with diuresis and he was scheduled to be seen by his nephrologist Dr. Lowanda Foster as an outpatient for follow up.     Letti Towell G. Everette Rank,  MD     AGM/MEDQ  D:  09/27/2013  T:  09/28/2013  Job:  [id number]

## 2013-09-28 NOTE — Discharge Summary (Signed)
NAMESELAH, BALDRIDGE NO.:  [id number]  MEDICAL RECORD NO.:  [id number]  LOCATION:  [id number]                          FACILITY:  [facility]  PHYSICIAN:  Shanterria Franta G. Meila Berke, MD   DATE OF BIRTH:  Mar 30, 1941  DATE OF ADMISSION:  09/20/2013 DATE OF DISCHARGE:  12/19/2014LH                              DISCHARGE SUMMARY   ADDENDUM:  The patient will be taking the following medications on discharge; Norvasc 10 mg daily, Coreg 6.25 mg b.i.d., Catapres 0.2 mg b.i.d., Demadex 20 mg 2 tabs daily, calcitriol 0.5 mg daily.  He is to continue the following medications; coenzyme Q10 one capsule daily, Cozaar 50 mg daily, Klor-Con 20 mEq daily, Zofran 4 mg every 4 hours as needed, aspirin 81 mg daily, Proventil nebulizer solution 2 puffs every 6 hours as needed for wheezing, and Bystolic 10 mg daily.     Malee Grays G. Everette Rank, MD     AGM/MEDQ  D:  09/27/2013  T:  09/28/2013  Job:  [id number]

## 2013-10-10 NOTE — Discharge Summary (Signed)
Alan Hawkins, REPETTI NO.:  [id number]  MEDICAL RECORD NO.:  [id number]  LOCATION:  [id number]                          FACILITY:  [facility]  PHYSICIAN:  Osby Sweetin G. Everette Rank, MD   DATE OF BIRTH:  16-Feb-1941  DATE OF ADMISSION:  09/20/2013 DATE OF DISCHARGE:  12/19/2014LH                              DISCHARGE SUMMARY   ADDENDUM  This patient had accelerated hypertension.     [name] G. Everette Rank, MD     AGM/MEDQ  D:  10/09/2013  T:  10/10/2013  Job:  [id number]

## 2013-10-11 ENCOUNTER — Other Ambulatory Visit: Payer: Self-pay

## 2013-10-14 ENCOUNTER — Encounter (HOSPITAL_COMMUNITY): Payer: Self-pay | Admitting: *Deleted

## 2013-10-14 MED ORDER — DEXTROSE 5 % IV SOLN
1.5000 g | INTRAVENOUS | Status: AC
  Administered 2013-10-15: 1.5 g via INTRAVENOUS
  Filled 2013-10-14: qty 1.5

## 2013-10-14 NOTE — Progress Notes (Signed)
10/14/13 1509  OBSTRUCTIVE SLEEP APNEA  Have you ever been diagnosed with sleep apnea through a sleep study? No  Do you snore loudly (loud enough to be heard through closed doors)?  0  Do you often feel tired, fatigued, or sleepy during the daytime? 1  Has anyone observed you stop breathing during your sleep? 1  Do you have, or are you being treated for high blood pressure? 1  BMI more than 35 kg/m2? 0  Age over 73 years old? 1  Neck circumference greater than 40 cm/18 inches? 0  Gender: 1  Obstructive Sleep Apnea Score 5  Score 4 or greater  Results sent to PCP

## 2013-10-14 NOTE — Progress Notes (Signed)
Anesthesia Chart Review:  Patient is a 73 year old male scheduled for insertion of a dialysis catheter on 10/15/13 by Dr. Bridgett Larsson.  Procedure is scheduled for MAC anesthesia. Nephrologist Dr. Hinda Lenis called [facility] and stated catheter needed to be placed either today or tomorrow with plans to start hemodialysis in the very near future.  Patient is scheduled to be a same day work-up.  He was hospitalized at [facility] in mid December 2014 with acute on chronic CHF with worsening chronic renal failure. On the day of his admission (09/20/13), he was sent to [facility] for an urgent echocardiogram by his PCP Dr. Everette Rank.  Results showed mild to moderate LVH with LVEF approximately 35-40%, anterolateral and inferolateral wall motion abnormalities consistent with ischemic cardiomyopathy, grade 1 diastolic dysfunction with increased filling pressures. He had evidence of mild aortic regurgitation, mild mitral regurgitation, and moderate pulmonary hypertension with PASP 51 mm of mercury. Trivial to small pericardial effusion was also noted. Patient was also having occasional chest tightness with exertion.  Coronary angiography was avoided due to high propensity for development of a contrast induced nephropathy.  Patient was to have additional out-patient cardiology follow-up for consideration of further ischemic work-up with notes suggesting coronary angiography would be considered if he follows thru with dialysis. I did speak with cardiologist Dr. Earl Gala regarding plans for surgery since it appears patient was actually suppose to see him tomorrow.  He understands case is posted for MAC but could require GA.  He felt MAC would be safest, if possible, but overall was comfortable with patient proceeding with catheter insertion since he has been on b-blocker therapy.  Patient will of course be evaluated by his assigned anesthesiologist tomorrow preoperatively to ensure no acute changes in his status.  Dr.  Bronson Ing stated he would be available for questions if needed, office number [phone].   EKG on 09/20/13 showed SR with first degree AVB, possible LAE, right BBB.  Previous CXR on 09/20/13 showed:  1. Stable bibasilar pulmonary opacity, which is of uncertain chronicity. Wall this may represent pleural-parenchymal scarring, some component of active infiltrate cannot be excluded.  2. Right upper lobe nodular density. Chest CT recommended to exclude pulmonary neoplasm.  3. Stable mild cardiomegaly and COPD. (CXR report was already noted by his PCP Dr. Jabier Mutton.  Defer further recommendations to him.)  He will get an ISTAT on arrival.  George [facility] [employer] Short Stay [facility]/Anesthesiology Phone [phone] 10/14/2013 4:25 PM

## 2013-10-15 ENCOUNTER — Encounter (HOSPITAL_COMMUNITY): Payer: Medicare Other | Admitting: Vascular Surgery

## 2013-10-15 ENCOUNTER — Encounter: Payer: Medicare Other | Admitting: Cardiovascular Disease

## 2013-10-15 ENCOUNTER — Encounter (HOSPITAL_COMMUNITY): Payer: Self-pay | Admitting: *Deleted

## 2013-10-15 ENCOUNTER — Other Ambulatory Visit: Payer: Self-pay | Admitting: *Deleted

## 2013-10-15 ENCOUNTER — Ambulatory Visit (HOSPITAL_COMMUNITY): Payer: Medicare Other

## 2013-10-15 ENCOUNTER — Telehealth: Payer: Self-pay | Admitting: Vascular Surgery

## 2013-10-15 ENCOUNTER — Ambulatory Visit (HOSPITAL_COMMUNITY): Payer: Medicare Other | Admitting: Vascular Surgery

## 2013-10-15 ENCOUNTER — Encounter (HOSPITAL_COMMUNITY)
Admission: RE | Disposition: A | Discharge status: Home / Self Care | Payer: Self-pay | Source: Ambulatory Visit | Attending: Vascular Surgery

## 2013-10-15 ENCOUNTER — Ambulatory Visit (HOSPITAL_COMMUNITY)
Admission: RE | Admit: 2013-10-15 | Discharge: 2013-10-15 | Disposition: A | Discharge status: Home / Self Care | Payer: Medicare Other | Source: Ambulatory Visit | Attending: Vascular Surgery | Admitting: Vascular Surgery

## 2013-10-15 DIAGNOSIS — I120 Hypertensive chronic kidney disease with stage 5 chronic kidney disease or end stage renal disease: Secondary | ICD-10-CM | POA: Insufficient documentation

## 2013-10-15 DIAGNOSIS — Z87891 Personal history of nicotine dependence: Secondary | ICD-10-CM | POA: Insufficient documentation

## 2013-10-15 DIAGNOSIS — Z992 Dependence on renal dialysis: Secondary | ICD-10-CM

## 2013-10-15 DIAGNOSIS — I252 Old myocardial infarction: Secondary | ICD-10-CM | POA: Insufficient documentation

## 2013-10-15 DIAGNOSIS — N186 End stage renal disease: Secondary | ICD-10-CM

## 2013-10-15 DIAGNOSIS — K219 Gastro-esophageal reflux disease without esophagitis: Secondary | ICD-10-CM | POA: Insufficient documentation

## 2013-10-15 DIAGNOSIS — E785 Hyperlipidemia, unspecified: Secondary | ICD-10-CM | POA: Insufficient documentation

## 2013-10-15 DIAGNOSIS — N184 Chronic kidney disease, stage 4 (severe): Secondary | ICD-10-CM

## 2013-10-15 DIAGNOSIS — I509 Heart failure, unspecified: Secondary | ICD-10-CM | POA: Insufficient documentation

## 2013-10-15 HISTORY — PX: INSERTION OF DIALYSIS CATHETER: SHX1324

## 2013-10-15 HISTORY — DX: Heart failure, unspecified: I50.9

## 2013-10-15 LAB — POCT I-STAT 4, (NA,K, GLUC, HGB,HCT)
GLUCOSE: 110 mg/dL — AB (ref 70–99)
HEMATOCRIT: 30 % — AB (ref 39.0–52.0)
Hemoglobin: 10.2 g/dL — ABNORMAL LOW (ref 13.0–17.0)
Potassium: 4.5 mEq/L (ref 3.7–5.3)
SODIUM: 139 meq/L (ref 137–147)

## 2013-10-15 SURGERY — INSERTION OF DIALYSIS CATHETER
Anesthesia: Monitor Anesthesia Care | Site: Neck | Laterality: Right

## 2013-10-15 MED ORDER — OXYCODONE HCL 5 MG PO TABS: 5.0000 mg | ORAL_TABLET | ORAL | Status: DC | PRN

## 2013-10-15 MED ORDER — LIDOCAINE-EPINEPHRINE (PF) 1 %-1:200000 IJ SOLN
INTRAMUSCULAR | Status: DC | PRN
  Administered 2013-10-15: 17 mL via INTRADERMAL

## 2013-10-15 MED ORDER — ONDANSETRON HCL 4 MG/2ML IJ SOLN: 4.0000 mg | Freq: Four times a day (QID) | INTRAMUSCULAR | Status: DC | PRN

## 2013-10-15 MED ORDER — HEPARIN SODIUM (PORCINE) 1000 UNIT/ML IJ SOLN
INTRAMUSCULAR | Status: DC | PRN
  Administered 2013-10-15: 4.6 mL

## 2013-10-15 MED ORDER — FENTANYL CITRATE 0.05 MG/ML IJ SOLN: 25.0000 ug | INTRAMUSCULAR | Status: DC | PRN

## 2013-10-15 MED ORDER — MIDAZOLAM HCL 5 MG/5ML IJ SOLN
INTRAMUSCULAR | Status: DC | PRN
  Administered 2013-10-15 (×2): 1 mg via INTRAVENOUS

## 2013-10-15 MED ORDER — OXYCODONE HCL 5 MG PO TABS: 5.0000 mg | ORAL_TABLET | Freq: Once | ORAL | Status: DC | PRN

## 2013-10-15 MED ORDER — 0.9 % SODIUM CHLORIDE (POUR BTL) OPTIME
TOPICAL | Status: DC | PRN
  Administered 2013-10-15: 1000 mL

## 2013-10-15 MED ORDER — SODIUM CHLORIDE 0.9 % IR SOLN
Status: DC | PRN
  Administered 2013-10-15: 08:00:00

## 2013-10-15 MED ORDER — PROPOFOL INFUSION 10 MG/ML OPTIME
INTRAVENOUS | Status: DC | PRN
  Administered 2013-10-15: 160 ug/kg/min via INTRAVENOUS

## 2013-10-15 MED ORDER — SODIUM CHLORIDE 0.9 % IV SOLN: INTRAVENOUS | Status: DC

## 2013-10-15 MED ORDER — FENTANYL CITRATE 0.05 MG/ML IJ SOLN
INTRAMUSCULAR | Status: DC | PRN
  Administered 2013-10-15: 50 ug via INTRAVENOUS

## 2013-10-15 MED ORDER — SODIUM CHLORIDE 0.9 % IV SOLN
INTRAVENOUS | Status: DC | PRN
  Administered 2013-10-15: 07:00:00 via INTRAVENOUS

## 2013-10-15 MED ORDER — LIDOCAINE-EPINEPHRINE (PF) 1 %-1:200000 IJ SOLN
INTRAMUSCULAR | Status: AC
  Filled 2013-10-15: qty 10

## 2013-10-15 MED ORDER — OXYCODONE HCL 5 MG/5ML PO SOLN: 5.0000 mg | Freq: Once | ORAL | Status: DC | PRN

## 2013-10-15 MED ORDER — HEPARIN SODIUM (PORCINE) 1000 UNIT/ML IJ SOLN
INTRAMUSCULAR | Status: AC
  Filled 2013-10-15: qty 1

## 2013-10-15 SURGICAL SUPPLY — 47 items
ADH SKN CLS APL DERMABOND .7 (GAUZE/BANDAGES/DRESSINGS) ×1
BAG DECANTER FOR FLEXI CONT (MISCELLANEOUS) ×3 IMPLANT
CATH CANNON HEMO 15F 50CM (CATHETERS) IMPLANT
CATH CANNON HEMO 15FR 19 (HEMODIALYSIS SUPPLIES) IMPLANT
CATH CANNON HEMO 15FR 23CM (HEMODIALYSIS SUPPLIES) ×2 IMPLANT
CATH CANNON HEMO 15FR 31CM (HEMODIALYSIS SUPPLIES) IMPLANT
CATH CANNON HEMO 15FR 32 (HEMODIALYSIS SUPPLIES) IMPLANT
CATH CANNON HEMO 15FR 32CM (HEMODIALYSIS SUPPLIES) IMPLANT
CATH STRAIGHT 5FR 65CM (CATHETERS) IMPLANT
COVER PROBE W GEL 5X96 (DRAPES) ×3 IMPLANT
COVER SURGICAL LIGHT HANDLE (MISCELLANEOUS) ×3 IMPLANT
DERMABOND ADVANCED (GAUZE/BANDAGES/DRESSINGS) ×2
DERMABOND ADVANCED .7 DNX12 (GAUZE/BANDAGES/DRESSINGS) IMPLANT
DRAPE C-ARM 42X72 X-RAY (DRAPES) ×3 IMPLANT
DRAPE CHEST BREAST 15X10 FENES (DRAPES) ×3 IMPLANT
GAUZE SPONGE 2X2 8PLY STRL LF (GAUZE/BANDAGES/DRESSINGS) ×1 IMPLANT
GAUZE SPONGE 4X4 16PLY XRAY LF (GAUZE/BANDAGES/DRESSINGS) ×3 IMPLANT
GLOVE BIO SURGEON STRL SZ7 (GLOVE) ×3 IMPLANT
GLOVE BIOGEL PI IND STRL 7.5 (GLOVE) ×1 IMPLANT
GLOVE BIOGEL PI INDICATOR 7.5 (GLOVE) ×2
GOWN STRL NON-REIN LRG LVL3 (GOWN DISPOSABLE) ×9 IMPLANT
KIT BASIN OR (CUSTOM PROCEDURE TRAY) ×3 IMPLANT
KIT ROOM TURNOVER OR (KITS) ×3 IMPLANT
NDL 18GX1X1/2 (RX/OR ONLY) (NEEDLE) ×1 IMPLANT
NDL HYPO 25GX1X1/2 BEV (NEEDLE) ×1 IMPLANT
NEEDLE 18GX1X1/2 (RX/OR ONLY) (NEEDLE) ×3 IMPLANT
NEEDLE HYPO 25GX1X1/2 BEV (NEEDLE) ×3 IMPLANT
NS IRRIG 1000ML POUR BTL (IV SOLUTION) ×3 IMPLANT
PACK SURGICAL SETUP 50X90 (CUSTOM PROCEDURE TRAY) ×3 IMPLANT
PAD ARMBOARD 7.5X6 YLW CONV (MISCELLANEOUS) ×6 IMPLANT
SET MICROPUNCTURE 5F STIFF (MISCELLANEOUS) ×2 IMPLANT
SOAP 2 % CHG 4 OZ (WOUND CARE) ×3 IMPLANT
SPONGE GAUZE 2X2 STER 10/PKG (GAUZE/BANDAGES/DRESSINGS) ×2
SPONGE GAUZE 4X4 12PLY (GAUZE/BANDAGES/DRESSINGS) ×2 IMPLANT
SUT ETHILON 3 0 PS 1 (SUTURE) ×3 IMPLANT
SUT MNCRL AB 4-0 PS2 18 (SUTURE) ×3 IMPLANT
SYR 20CC LL (SYRINGE) ×6 IMPLANT
SYR 30ML LL (SYRINGE) IMPLANT
SYR 3ML LL SCALE MARK (SYRINGE) ×3 IMPLANT
SYR 5ML LL (SYRINGE) ×3 IMPLANT
SYR CONTROL 10ML LL (SYRINGE) ×3 IMPLANT
SYRINGE 10CC LL (SYRINGE) ×3 IMPLANT
TAPE CLOTH SURG 4X10 WHT LF (GAUZE/BANDAGES/DRESSINGS) ×2 IMPLANT
TOWEL OR 17X24 6PK STRL BLUE (TOWEL DISPOSABLE) ×3 IMPLANT
TOWEL OR 17X26 10 PK STRL BLUE (TOWEL DISPOSABLE) ×3 IMPLANT
WATER STERILE IRR 1000ML POUR (IV SOLUTION) ×3 IMPLANT
WIRE AMPLATZ SS-J .035X180CM (WIRE) IMPLANT

## 2013-10-15 NOTE — Op Note (Signed)
OPERATIVE NOTE  PROCEDURE: 1. Right internal jugular vein tunneled dialysis catheter placement 2. Right internal jugular vein cannulation under ultrasound guidance  PRE-OPERATIVE DIAGNOSIS: end-stage renal failure  POST-OPERATIVE DIAGNOSIS: same as above  SURGEON: Adele Barthel, MD  ANESTHESIA: local and MAC  ESTIMATED BLOOD LOSS: 30 cc  FINDING(S): 1.  Tips of the catheter in the right atrium on fluoroscopy 2.  No obvious pneumothorax on fluoroscopy  SPECIMEN(S):  none  INDICATIONS:   Alan Hawkins is a 73 y.o. male who presents with end stage renal disease.  The patient presents for tunneled dialysis catheter placement.  The patient is aware the risks of tunneled dialysis catheter placement include but are not limited to: bleeding, infection, central venous injury, pneumothorax, possible venous stenosis, possible malpositioning in the venous system, and possible infections related to long-term catheter presence.  The patient was aware of these risks and agreed to proceed.  DESCRIPTION: After written full informed consent was obtained from the patient, the patient was taken back to the operating room.  Prior to induction, the patient was given IV antibiotics.  After obtaining adequate sedation, the patient was prepped and draped in the standard fashion for a chest or neck tunneled dialysis catheter placement.  I anesthesized the neck cannulation site with local anesthetic.  Under ultrasound guidance, the right internal jugular vein was cannulated with the 18 gauge needle.  A J-wire was then placed down in the right ventricle under fluroscopic guidance.  The wire was then secured in place with a clamp to the drapes.  The cannulation site, the catheter exit site, and tract for the subcutaneous tunnel were then anesthesized with a total of 20 cc of a 1:1 mixture of 0.5% Marcaine without epinepherine and 1% Lidocaine with epinepherine.  I then made stab incisions at the neck and exit sites.    I dissected from the exit site to the cannulation site with a metal tunneler.   The subcutaneous tunnel was dilated by passing a plastic dilator over the metal dissector. The wire was then unclamped and I removed the needle.  The skin tract and venotomy was dilated serially with dilators.  Finally, the dilator-sheath was placed under fluroscopic guidance into the superior vena cava.  The dilator and wire were removed.  A 23 cm Diatek catheter was placed under fluoroscopic guidance down into the right atrium.  The sheath was broken and peeled away while holding the catheter cuff at the level of the skin.  The back end of this catheter was transected, revealing the two lumens of this catheter.  The ports were docked onto these two lumens.  The catheter hub was then screwed into place.  Each port was tested by aspirating and flushing.  No resistance was noted.  Each port was then thoroughly flushed with heparinized saline.  The catheter was secured in placed with two interrupted stitches of 3-0 Nylon tied to the catheter.  The neck incision was closed with a U-stitch of 4-0 Monocryl.  The neck and chest incision were cleaned and sterile bandages applied.  Each port was then loaded with concentrated heparin (1000 Units/mL) at the manufacturer recommended volumes to each port.  Sterile caps were applied to each port.  On completion fluoroscopy, the tips of the catheter were in the right atrium, and there was no evidence of pneumothorax.  COMPLICATIONS: none  CONDITION: stable  Adele Barthel, MD [facility] Office: [phone] Pager: [phone]  10/15/2013, 8:22 AM

## 2013-10-15 NOTE — Anesthesia Postprocedure Evaluation (Signed)
Anesthesia Post Note  Patient: Alan Hawkins  Procedure(s) Performed: Procedure(s) (LRB): INSERTION OF DIALYSIS CATHETER (Right)  Anesthesia type: MAC  Patient location: PACU  Post pain: Pain level controlled and Adequate analgesia  Post assessment: Post-op Vital signs reviewed, Patient's Cardiovascular Status Stable and Respiratory Function Stable  Last Vitals:  Filed Vitals:   10/15/13 0845  BP: 138/62  Pulse: 48  Temp:   Resp: 11    Post vital signs: Reviewed and stable  Level of consciousness: awake, alert  and oriented  Complications: No apparent anesthesia complications

## 2013-10-15 NOTE — H&P (Signed)
Brief History and Physical  History of Present Illness  Alan Hawkins is a 73 y.o. male who presents with chief complaint: end stage renal disease.   The patient has been in CKD but recently had precipitous deterioration in renal function.  He was sent here today for tunneled dialysis catheter to initiate hemodialysis.  He has not been seen for permanent access placement.    Past Medical History  Diagnosis Date  . Essential hypertension, benign   . Hyperlipidemia   . GERD (gastroesophageal reflux disease)   . CKD (chronic kidney disease) stage 3, GFR 30-59 ml/min     Creatinine reportedly 2.0 six months ago  . CHF (congestive heart failure)   . Myocardial infarction     Past Surgical History  Procedure Laterality Date  . Cataract extraction w/phaco  11/17/2011    Procedure: CATARACT EXTRACTION PHACO AND INTRAOCULAR LENS PLACEMENT (IOC);  Surgeon: Tonny Branch, MD;  Location: [facility];  Service: Ophthalmology;  Laterality: Right;  CDE=16.17  . Eye surgery      R KPE w/ IOL  . Cataract extraction w/phaco  12/05/2011    Procedure: CATARACT EXTRACTION PHACO AND INTRAOCULAR LENS PLACEMENT (IOC);  Surgeon: Tonny Branch, MD;  Location: [facility];  Service: Ophthalmology;  Laterality: Left;  CDE 16.22  . Vasectomy      History   Social History  . Marital Status: Married    Spouse Name: N/A    Number of Children: N/A  . Years of Education: N/A   Occupational History  . Not on file.   Social History Main Topics  . Smoking status: Former Smoker    Types: Cigarettes  . Smokeless tobacco: Never Used     Comment: "Stopped smoking at least 35-40 years ago"  . Alcohol Use: Yes     Comment: Occasional monthly beer  . Drug Use: No  . Sexual Activity: Not on file   Other Topics Concern  . Not on file   Social History Narrative  . No narrative on file    Family History  Problem Relation Age of Onset  . Heart disease Mother   . Heart disease Sister   . Stroke Father     Current  Facility-Administered Medications on File Prior to Encounter  Medication Dose Route Frequency Provider Last Rate Last Dose  . fentaNYL (SUBLIMAZE) injection 25-50 mcg  25-50 mcg Intravenous Q5 min PRN Lerry Liner, MD       Current Outpatient Prescriptions on File Prior to Encounter  Medication Sig Dispense Refill  . albuterol (PROVENTIL) (2.5 MG/3ML) 0.083% nebulizer solution Take 2.5 mg by nebulization every 6 (six) hours as needed for wheezing or shortness of breath.      Marland Kitchen amLODipine (NORVASC) 10 MG tablet Take 1 tablet (10 mg total) by mouth daily.  30 tablet  5  . aspirin EC 81 MG tablet Take 81 mg by mouth daily.      . calcitRIOL (ROCALTROL) 0.5 MCG capsule Take 1 capsule (0.5 mcg total) by mouth daily.  30 capsule  5  . carvedilol (COREG) 6.25 MG tablet Take 1 tablet (6.25 mg total) by mouth 2 (two) times daily with a meal.  60 tablet  5  . cloNIDine (CATAPRES) 0.2 MG tablet Take 1 tablet (0.2 mg total) by mouth 2 (two) times daily.  60 tablet  5  . Coenzyme Q10 (CO Q 10 PO) Take 1 capsule by mouth daily.      Marland Kitchen KLOR-CON M20 20  MEQ tablet Take 20 mEq by mouth daily.       Marland Kitchen losartan (COZAAR) 50 MG tablet Take 50 mg by mouth daily.      . nebivolol (BYSTOLIC) 10 MG tablet Take 10 mg by mouth daily.      . ondansetron (ZOFRAN) 4 MG tablet Take 4 mg by mouth every 4 (four) hours as needed. For nausea and/or vomiting      . torsemide (DEMADEX) 20 MG tablet Take 2 tablets (40 mg total) by mouth daily.  30 tablet  5    Allergies  Allergen Reactions  . Bee Venom Anaphylaxis  . Nitrofuran Derivatives Nausea And Vomiting    Nausea and vomiting , sever headache while on nitroglycerin infusion twice    REVIEW OF SYSTEMS:  (Positives checked otherwise negative)  CARDIOVASCULAR:  []  chest pain, []  chest pressure, []  palpitations, []  shortness of breath when laying flat, [x]  shortness of breath with exertion,  []  pain in feet when walking, []  pain in feet when laying flat, []  history of  blood clot in veins (DVT), []  history of phlebitis, []  swelling in legs, []  varicose veins  PULMONARY:  []  productive cough, []  asthma, []  wheezing  NEUROLOGIC:  []  weakness in arms or legs, []  numbness in arms or legs, []  difficulty speaking or slurred speech, []  temporary loss of vision in one eye, []  dizziness  HEMATOLOGIC:  []  bleeding problems, []  problems with blood clotting too easily  MUSCULOSKEL:  []  joint pain, []  joint swelling  GASTROINTEST:  []  vomiting blood, []  blood in stool     GENITOURINARY:  []  burning with urination, []  blood in urine, [x]  CKD  PSYCHIATRIC:  []  history of major depression  INTEGUMENTARY:  []  rashes, []  ulcers  CONSTITUTIONAL:  []  fever, []  chills  Physical Examination  Filed Vitals:   10/14/13 1449 10/15/13 0552  BP:  155/70  Pulse:  50  Temp:  97.6 F (36.4 C)  TempSrc:  Oral  Resp:  18  Height: 5\' 10"  (1.778 m) 5\' 10"  (1.778 m)  Weight: 180 lb (81.647 kg) 185 lb (83.915 kg)  SpO2:  100%    General: A&O x 3, WDWN, elderly  Pulmonary: Sym exp, good air movt, CTAB, no rales, rhonchi, & wheezing  Cardiac: RRR, Nl S1, S2, no Murmurs, rubs or gallops  Gastrointestinal: soft, NTND, -G/R, - HSM, - masses, - CVAT B  Musculoskeletal: M/S 5/5 throughout , Extremities without ischemic changes   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   The patient is scheduled for: tunneled dialysis catheter placement The patient is aware the risks of tunneled dialysis catheter placement include but are not limited to: bleeding, infection, central venous injury, pneumothorax, possible venous stenosis, possible malpositioning in the venous system, and possible infections related to long-term catheter presence.   The patient is aware of the risks and agrees to proceed.  Adele Barthel, MD [facility] Office: [phone] Pager:  [phone]  10/15/2013, 7:21 AM

## 2013-10-15 NOTE — Preoperative (Addendum)
Beta Blockers   Coreg 6.25 mg PO taken 10-15-13 @ 04:30

## 2013-10-15 NOTE — Telephone Encounter (Addendum)
Message copied by Gena Fray on Tue Oct 15, 2013  3:44 PM ------      Message from: Mena Goes      Created: Tue Oct 15, 2013 12:36 PM      Regarding: schedule                   ----- Message -----         From: Sherrye Payor, RN         Sent: 10/15/2013   9:30 AM           To: Mena Goes, CMA                        ----- Message -----         From: Conrad Tyronza, MD         Sent: 10/15/2013   8:23 AM           To: Patrici Ranks, Alfonso Patten, RN            Alan Hawkins      [id number]      [birth year]            PROCEDURE:      1. Right internal jugular vein tunneled dialysis catheter placement      2. Right internal jugular vein cannulation under ultrasound guidance            Follow-up: patient needs to be schedule for next available appointment for access placement Additionally, the patient will need bilateral arm vein mapping for his access evaluation.    10/15/13: no answer, no voicemail- mailed letter, dpm

## 2013-10-15 NOTE — Transfer of Care (Signed)
Immediate Anesthesia Transfer of Care Note  Patient: Alan Hawkins  Procedure(s) Performed: Procedure(s): INSERTION OF DIALYSIS CATHETER (Right)  Patient Location: PACU  Anesthesia Type:MAC  Level of Consciousness: awake, alert , oriented and patient cooperative  Airway & Oxygen Therapy: Patient Spontanous Breathing and Patient connected to nasal cannula oxygen  Post-op Assessment: Report given to PACU RN and Post -op Vital signs reviewed and stable  Post vital signs: Reviewed and stable  Complications: No apparent anesthesia complications

## 2013-10-15 NOTE — Anesthesia Preprocedure Evaluation (Addendum)
Anesthesia Evaluation  Patient identified by MRN, date of birth, ID band Patient awake    Reviewed: Allergy & Precautions, H&P , NPO status , Patient's Chart, lab work & pertinent test results  Airway Mallampati: II TM Distance: >3 FB Neck ROM: full    Dental  (+) Teeth Intact, Dental Advisory Given and Caps,    Pulmonary former smoker,  Chest x-ray RUL density COPD   Pulmonary exam normal       Cardiovascular Exercise Tolerance: Poor hypertension, Pt. on medications and Pt. on home beta blockers + Past MI and +CHF Rhythm:Regular Rate:Normal  Hospitalized Dec. 2014 for acute CHF  09-20-13 ECHO Impressions:  - No prior study for comparison. Mild to moderate LVH with   LVEF 35-40%, wall motion abnormalities consistent with   ischemic cardiomyopathy, grade 1 diastolic dysfunction   with increased filling pressures. Mild left atrial   enlargement. MAC with mild mtiral regurgitation. Sclerotic  09-23-13 EKG  NSR w/ 1 degree Heart Block- rate 80's   aortic valve with mild aortic regurgitation. Mild   tricuspid regurgitation with moderate pulmonary   hypertension, PASP 51 mmHg. Trivial to small pericardial   effusion.    Neuro/Psych negative neurological ROS  negative psych ROS   GI/Hepatic Neg liver ROS, GERD-  Medicated and Controlled,  Endo/Other  negative endocrine ROS  Renal/GU ESRFRenal disease     Musculoskeletal  (+) Arthritis -, Osteoarthritis,    Abdominal Normal abdominal exam  (+)   Peds  Hematology negative hematology ROS (+)   Anesthesia Other Findings All crowns, refer to diagram.  Reproductive/Obstetrics                      Anesthesia Physical Anesthesia Plan  ASA: III  Anesthesia Plan: MAC   Post-op Pain Management:    Induction: Intravenous  Airway Management Planned: Simple Face Mask  Additional Equipment:   Intra-op Plan:   Post-operative Plan:   Informed  Consent: I have reviewed the patients History and Physical, chart, labs and discussed the procedure including the risks, benefits and alternatives for the proposed anesthesia with the patient or authorized representative who has indicated his/her understanding and acceptance.   Dental advisory given  Plan Discussed with: CRNA, Anesthesiologist and Surgeon  Anesthesia Plan Comments:        Anesthesia Quick Evaluation

## 2013-10-16 ENCOUNTER — Encounter (HOSPITAL_COMMUNITY): Payer: Self-pay | Admitting: Vascular Surgery

## 2013-10-17 ENCOUNTER — Other Ambulatory Visit: Payer: Self-pay | Admitting: Vascular Surgery

## 2013-10-17 DIAGNOSIS — N186 End stage renal disease: Secondary | ICD-10-CM

## 2013-10-17 DIAGNOSIS — Z01810 Encounter for preprocedural cardiovascular examination: Secondary | ICD-10-CM

## 2013-11-04 ENCOUNTER — Encounter: Payer: Self-pay | Admitting: *Deleted

## 2013-11-04 ENCOUNTER — Ambulatory Visit (INDEPENDENT_AMBULATORY_CARE_PROVIDER_SITE_OTHER): Payer: Medicare Other | Admitting: Cardiovascular Disease

## 2013-11-04 ENCOUNTER — Encounter: Payer: Self-pay | Admitting: Cardiovascular Disease

## 2013-11-04 ENCOUNTER — Encounter (INDEPENDENT_AMBULATORY_CARE_PROVIDER_SITE_OTHER): Payer: Self-pay

## 2013-11-04 VITALS — BP 170/72 | HR 52 | Ht 70.0 in | Wt 186.0 lb

## 2013-11-04 DIAGNOSIS — Z992 Dependence on renal dialysis: Secondary | ICD-10-CM

## 2013-11-04 DIAGNOSIS — I255 Ischemic cardiomyopathy: Secondary | ICD-10-CM

## 2013-11-04 DIAGNOSIS — I5022 Chronic systolic (congestive) heart failure: Secondary | ICD-10-CM

## 2013-11-04 DIAGNOSIS — I10 Essential (primary) hypertension: Secondary | ICD-10-CM

## 2013-11-04 DIAGNOSIS — N186 End stage renal disease: Secondary | ICD-10-CM

## 2013-11-04 DIAGNOSIS — I2589 Other forms of chronic ischemic heart disease: Secondary | ICD-10-CM

## 2013-11-04 NOTE — Patient Instructions (Signed)
Your physician recommends that you schedule a follow-up appointment in: 1 month with Dr Bronson Ing  Your physician has requested that you have a lexiscan myoview. For further information please visit [email or web address]. Please follow instruction sheet, as given.

## 2013-11-04 NOTE — Progress Notes (Signed)
Patient ID: Alan Hawkins, male   DOB: [birth year], 73 y.o.   MRN: [id number]      SUBJECTIVE: The patient presents for post hospitalization followup. I evaluated him during his hospitalization and he was found to have an ischemic cardiomyopathy, ejection fraction 35-40% with severe hypokinesis of the mid-distalanterolateral and inferolateral myocardium, mild to moderate LVH, grade 1 diastolic dysfunction, mild mitral and aortic regurgitation, and moderately elevated pulmonary pressures of 51 mm mercury. He also had accelerated HTN. He was found to be in acute on chronic renal failure and is now on dialysis, and dialyzes on Mondays, Wednesdays, and Fridays.  He does not take his antihypertensive medications on the days of dialysis as he was told they would be washed out of his system. He said that his blood pressures are checked both prior to and after dialysis and have been well controlled. He denies any recurrence of his previous symptoms of chest tightness accompanied by nausea with exertion. He is able to lie flat and denies paroxysmal nocturnal dyspnea as well. He denies palpitations and leg swelling. He only gets dizzy if he stands up too quickly, and this is seldom in occurrence.   Allergies  Allergen Reactions  . Bee Venom Anaphylaxis  . Nitrofuran Derivatives Nausea And Vomiting    Nausea and vomiting , sever headache while on nitroglycerin infusion twice    Current Outpatient Prescriptions  Medication Sig Dispense Refill  . albuterol (PROVENTIL) (2.5 MG/3ML) 0.083% nebulizer solution Take 2.5 mg by nebulization every 6 (six) hours as needed for wheezing or shortness of breath.      Marland Kitchen amLODipine (NORVASC) 10 MG tablet Take 1 tablet (10 mg total) by mouth daily.  30 tablet  5  . aspirin EC 81 MG tablet Take 81 mg by mouth daily.      . carvedilol (COREG) 6.25 MG tablet Take 1 tablet (6.25 mg total) by mouth 2 (two) times daily with a meal.  60 tablet  5  . cloNIDine (CATAPRES) 0.2 MG  tablet Take 1 tablet (0.2 mg total) by mouth 2 (two) times daily.  60 tablet  5  . Coenzyme Q10 (CO Q 10 PO) Take 1 capsule by mouth daily.      . nebivolol (BYSTOLIC) 10 MG tablet Take 10 mg by mouth daily.      . ondansetron (ZOFRAN) 4 MG tablet Take 4 mg by mouth every 4 (four) hours as needed. For nausea and/or vomiting      . oxyCODONE (ROXICODONE) 5 MG immediate release tablet Take 1 tablet (5 mg total) by mouth every 4 (four) hours as needed for severe pain.  15 tablet  0  . torsemide (DEMADEX) 20 MG tablet Take 2 tablets (40 mg total) by mouth daily.  30 tablet  5   No current facility-administered medications for this visit.   Facility-Administered Medications Ordered in Other Visits  Medication Dose Route Frequency Provider Last Rate Last Dose  . fentaNYL (SUBLIMAZE) injection 25-50 mcg  25-50 mcg Intravenous Q5 min PRN Lerry Liner, MD        Past Medical History  Diagnosis Date  . Essential hypertension, benign   . Hyperlipidemia   . GERD (gastroesophageal reflux disease)   . CKD (chronic kidney disease) stage 3, GFR 30-59 ml/min     Creatinine reportedly 2.0 six months ago  . CHF (congestive heart failure)   . Myocardial infarction     Past Surgical History  Procedure Laterality Date  . Cataract extraction w/phaco  11/17/2011    Procedure: CATARACT EXTRACTION PHACO AND INTRAOCULAR LENS PLACEMENT (IOC);  Surgeon: Tonny Branch, MD;  Location: [facility];  Service: Ophthalmology;  Laterality: Right;  CDE=16.17  . Eye surgery      R KPE w/ IOL  . Cataract extraction w/phaco  12/05/2011    Procedure: CATARACT EXTRACTION PHACO AND INTRAOCULAR LENS PLACEMENT (IOC);  Surgeon: Tonny Branch, MD;  Location: [facility];  Service: Ophthalmology;  Laterality: Left;  CDE 16.22  . Vasectomy    . Insertion of dialysis catheter Right 10/15/2013    Procedure: INSERTION OF DIALYSIS CATHETER;  Surgeon: Conrad [place], MD;  Location: [facility];  Service: Vascular;  Laterality: Right;    History   Social  History  . Marital Status: Married    Spouse Name: N/A    Number of Children: N/A  . Years of Education: N/A   Occupational History  . Not on file.   Social History Main Topics  . Smoking status: Former Smoker    Types: Cigarettes  . Smokeless tobacco: Never Used     Comment: "Stopped smoking at least 35-40 years ago"  . Alcohol Use: Yes     Comment: Occasional monthly beer  . Drug Use: No  . Sexual Activity: Not on file   Other Topics Concern  . Not on file   Social History Narrative  . No narrative on file     Filed Vitals:   11/04/13 0901  BP: 170/72  Pulse: 52  Height: 5\' 10"  (1.778 m)  Weight: 186 lb (84.369 kg)    PHYSICAL EXAM General: NAD Neck: No JVD, no thyromegaly or thyroid nodule.  Lungs: Clear to auscultation bilaterally with normal respiratory effort. CV: Nondisplaced PMI.  Heart regular S1/S2, no S3/S4, no murmur.  No peripheral edema.  No carotid bruit.  Normal pedal pulses.  Abdomen: Soft, nontender, no hepatosplenomegaly, no distention.  Neurologic: Alert and oriented x 3.  Psych: Normal affect. Extremities: No clubbing or cyanosis.   ECG: reviewed and available in electronic records.   Left ventricle: The cavity size was normal. Wall thickness was increased in a pattern of mild to moderate LVH. Systolic function was moderately reduced. The estimated ejection fraction was in the range of 35% to 40%. There is severe hypokinesis of the mid-distalanterolateral and inferolateral myocardium. Doppler parameters are consistent with abnormal left ventricular relaxation (grade 1 diastolic dysfunction). Doppler parameters are consistent with high ventricular filling pressure. - Aortic valve: Mildly to moderately calcified annulus. Trileaflet. Mild regurgitation. Mean gradient: 82mm Hg (S). - Mitral valve: Mildly calcified annulus. Mild regurgitation. - Left atrium: The atrium was mildly dilated. - Right atrium: Central venous pressure: 31mm Hg  (est). - Tricuspid valve: Mild regurgitation. - Pulmonary arteries: Systolic pressure was moderately increased. PA peak pressure: 82mm Hg (S). - Pericardium, extracardiac: A trivial pericardial effusion was identified. small posteriorly. Impressions:  - No prior study for comparison. Mild to moderate LVH with LVEF 35-40%, wall motion abnormalities consistent with ischemic cardiomyopathy, grade 1 diastolic dysfunction with increased filling pressures. Mild left atrial enlargement. MAC with mild mtiral regurgitation. Sclerotic aortic valve with mild aortic regurgitation. Mild tricuspid regurgitation with moderate pulmonary hypertension, PASP 51 mmHg. Trivial to small pericardial effusion.     ASSESSMENT AND PLAN: 1. Ischemic cardiomyopathy: will obtain a Lexiscan Cardiolite to evaluate for an ischemic etiology given the wall motion abnormalities and reduced EF. No recurrence of chest tightness with nausea. 2. Chronic systolic heart failure: now on dialysis which will be the  primary means of managing fluid status. Continue carvedilol and torsemide. 3. HTN: reportedly well controlled. I am uncertain as to why he is on two beta blockers (carvedilol and nebivolol). He denies any symptoms with respect to dizziness and lightheadedness. His resting HR is 52 bpm, and the patient says it has always been low even prior to being on beta blockers. For the time being, I will not make any adjustments.  Dispo: f/u in 1 month after Lexiscan.   Kate Sable, M.D., F.A.C.C.

## 2013-11-08 ENCOUNTER — Encounter (HOSPITAL_COMMUNITY): Payer: Medicare Other

## 2013-11-08 ENCOUNTER — Ambulatory Visit: Payer: Medicare Other | Admitting: Vascular Surgery

## 2013-11-08 ENCOUNTER — Other Ambulatory Visit (HOSPITAL_COMMUNITY): Payer: Medicare Other

## 2013-11-14 ENCOUNTER — Encounter (HOSPITAL_COMMUNITY)
Admission: RE | Admit: 2013-11-14 | Discharge: 2013-11-14 | Disposition: A | Discharge status: Home / Self Care | Payer: Medicare Other | Source: Ambulatory Visit | Attending: Cardiovascular Disease | Admitting: Cardiovascular Disease

## 2013-11-14 ENCOUNTER — Encounter (HOSPITAL_COMMUNITY): Payer: Self-pay

## 2013-11-14 DIAGNOSIS — I255 Ischemic cardiomyopathy: Secondary | ICD-10-CM

## 2013-11-14 DIAGNOSIS — I428 Other cardiomyopathies: Secondary | ICD-10-CM | POA: Insufficient documentation

## 2013-11-14 DIAGNOSIS — I2589 Other forms of chronic ischemic heart disease: Secondary | ICD-10-CM

## 2013-11-14 DIAGNOSIS — I129 Hypertensive chronic kidney disease with stage 1 through stage 4 chronic kidney disease, or unspecified chronic kidney disease: Secondary | ICD-10-CM | POA: Insufficient documentation

## 2013-11-14 DIAGNOSIS — I120 Hypertensive chronic kidney disease with stage 5 chronic kidney disease or end stage renal disease: Secondary | ICD-10-CM | POA: Insufficient documentation

## 2013-11-14 DIAGNOSIS — Z992 Dependence on renal dialysis: Secondary | ICD-10-CM | POA: Insufficient documentation

## 2013-11-14 DIAGNOSIS — I10 Essential (primary) hypertension: Secondary | ICD-10-CM

## 2013-11-14 DIAGNOSIS — N186 End stage renal disease: Secondary | ICD-10-CM | POA: Insufficient documentation

## 2013-11-14 DIAGNOSIS — I452 Bifascicular block: Secondary | ICD-10-CM | POA: Insufficient documentation

## 2013-11-14 MED ORDER — TECHNETIUM TC 99M SESTAMIBI GENERIC - CARDIOLITE
10.0000 | Freq: Once | INTRAVENOUS | Status: AC | PRN
  Administered 2013-11-14: 10 via INTRAVENOUS

## 2013-11-14 MED ORDER — REGADENOSON 0.4 MG/5ML IV SOLN
INTRAVENOUS | Status: AC
  Administered 2013-11-14: 0.4 mg via INTRAVENOUS
  Filled 2013-11-14: qty 5

## 2013-11-14 MED ORDER — SODIUM CHLORIDE 0.9 % IJ SOLN
INTRAMUSCULAR | Status: AC
  Administered 2013-11-14: 10 mL via INTRAVENOUS
  Filled 2013-11-14: qty 10

## 2013-11-14 MED ORDER — TECHNETIUM TC 99M SESTAMIBI - CARDIOLITE
30.0000 | Freq: Once | INTRAVENOUS | Status: AC | PRN
  Administered 2013-11-14: 30 via INTRAVENOUS

## 2013-11-14 NOTE — Progress Notes (Signed)
Stress Lab Nurses Notes - Alan Hawkins  Alan Hawkins 11/14/2013 Reason for doing test: HTN Type of test: Wille Glaser Nurse performing test: Gerrit Halls, RN Nuclear Medicine Tech: Melburn Hake Echo Tech: Not Applicable MD performing test: Koneswaran/K.Lawrence NP Family MD: [facility] Test explained and consent signed: yes IV started: 22g jelco, Saline lock flushed, No redness or edema and Saline lock started in radiology Symptoms: Chest tightness & nausea Treatment/Intervention: None Reason test stopped: protocol completed After recovery IV was: Discontinued via X-ray tech and No redness or edema Patient to return to [facility]. Med at : 11:30 Patient discharged: Home Patient's Condition upon discharge was: stable Comments: During test BP 144/72 & HR 89.  Recovery BP 153/77 & HR 69.  Symptoms resolved in recovery Geanie Cooley T

## 2013-11-15 ENCOUNTER — Telehealth: Payer: Self-pay | Admitting: *Deleted

## 2013-11-15 NOTE — Telephone Encounter (Signed)
Reminded pt of upcoming appointment on 2/20 with Dr Bronson Ing and that he will discuss stress test then.

## 2013-11-28 ENCOUNTER — Encounter: Payer: Self-pay | Admitting: Cardiovascular Disease

## 2013-11-28 ENCOUNTER — Ambulatory Visit (INDEPENDENT_AMBULATORY_CARE_PROVIDER_SITE_OTHER): Payer: Medicare Other | Admitting: Cardiovascular Disease

## 2013-11-28 VITALS — BP 183/61 | HR 59 | Ht 70.0 in | Wt 181.0 lb

## 2013-11-28 DIAGNOSIS — I255 Ischemic cardiomyopathy: Secondary | ICD-10-CM

## 2013-11-28 DIAGNOSIS — I2589 Other forms of chronic ischemic heart disease: Secondary | ICD-10-CM

## 2013-11-28 DIAGNOSIS — N186 End stage renal disease: Secondary | ICD-10-CM

## 2013-11-28 DIAGNOSIS — I5022 Chronic systolic (congestive) heart failure: Secondary | ICD-10-CM

## 2013-11-28 DIAGNOSIS — Z992 Dependence on renal dialysis: Secondary | ICD-10-CM

## 2013-11-28 DIAGNOSIS — I10 Essential (primary) hypertension: Secondary | ICD-10-CM

## 2013-11-28 NOTE — Patient Instructions (Signed)
Your physician recommends that you schedule a follow-up appointment in: 4 months    Your physician recommends that you continue on your current medications as directed. Please refer to the Current Medication list given to you today.    Thanks for choosing [facility] !

## 2013-11-28 NOTE — Progress Notes (Signed)
Patient ID: Alan Hawkins, male   DOB: 1940-10-30, 73 y.o.   MRN: [id number]      SUBJECTIVE: Alan Hawkins is doing well and denies chest pain, shortness of breath, and paroxysmal nocturnal dyspnea. He also denies leg swelling. He is feeling much better. He dialyzed earlier this morning. He said his blood pressure has been very well controlled with systolic readings in the [id number] mmHg range normally. He has not taken any of his medications today because he has not eaten yet.    Allergies  Allergen Reactions  . Bee Venom Anaphylaxis  . Nitrofuran Derivatives Nausea And Vomiting    Nausea and vomiting , sever headache while on nitroglycerin infusion twice  . Nitroglycerin Other (See Comments)    Headache   nausea    Current Outpatient Prescriptions  Medication Sig Dispense Refill  . amLODipine (NORVASC) 10 MG tablet Take 10 mg by mouth daily.       Marland Kitchen aspirin EC 81 MG tablet Take 81 mg by mouth daily. Take by mouth daily.      . carvedilol (COREG) 6.25 MG tablet Take 6.25 mg by mouth 2 (two) times daily with a meal.       . cloNIDine (CATAPRES) 0.2 MG tablet Take 0.2 mg by mouth 2 (two) times daily.       Marland Kitchen Co-Enzyme Q-10 30 MG CAPS Take 400 mg by mouth daily.       Marland Kitchen HYDROcodone-acetaminophen (NORCO/VICODIN) 5-325 MG per tablet 1-2 tablets.      . ondansetron (ZOFRAN) 4 MG tablet Take 4 mg by mouth every 8 (eight) hours as needed.       Marland Kitchen oxyCODONE (ROXICODONE) 5 MG immediate release tablet Take 1 tablet (5 mg total) by mouth every 4 (four) hours as needed for severe pain.  15 tablet  0  . polyethylene glycol (MIRALAX) packet Take 17 g by mouth daily as needed (Constipation).      . torsemide (DEMADEX) 20 MG tablet Take 20-40 mg by mouth daily.       . calcitRIOL (ROCALTROL) 0.5 MCG capsule       . losartan (COZAAR) 50 MG tablet        No current facility-administered medications for this visit.   Facility-Administered Medications Ordered in Other Visits  Medication Dose Route  Frequency Provider Last Rate Last Dose  . fentaNYL (SUBLIMAZE) injection 25-50 mcg  25-50 mcg Intravenous Q5 min PRN Lerry Liner, MD        Past Medical History  Diagnosis Date  . Essential hypertension, benign   . Hyperlipidemia   . GERD (gastroesophageal reflux disease)   . CKD (chronic kidney disease) stage 3, GFR 30-59 ml/min     Creatinine reportedly 2.0 six months ago  . CHF (congestive heart failure)   . Myocardial infarction     Past Surgical History  Procedure Laterality Date  . Cataract extraction w/phaco  11/17/2011    Procedure: CATARACT EXTRACTION PHACO AND INTRAOCULAR LENS PLACEMENT (IOC);  Surgeon: Tonny Branch, MD;  Location: [facility];  Service: Ophthalmology;  Laterality: Right;  CDE=16.17  . Eye surgery      R KPE w/ IOL  . Cataract extraction w/phaco  12/05/2011    Procedure: CATARACT EXTRACTION PHACO AND INTRAOCULAR LENS PLACEMENT (IOC);  Surgeon: Tonny Branch, MD;  Location: [facility];  Service: Ophthalmology;  Laterality: Left;  CDE 16.22  . Vasectomy    . Insertion of dialysis catheter Right 10/15/2013    Procedure: INSERTION OF DIALYSIS CATHETER;  Surgeon: Conrad [place], MD;  Location: [facility];  Service: Vascular;  Laterality: Right;    History   Social History  . Marital Status: Married    Spouse Name: N/A    Number of Children: N/A  . Years of Education: N/A   Occupational History  . Not on file.   Social History Main Topics  . Smoking status: Former Smoker    Types: Cigarettes  . Smokeless tobacco: Never Used     Comment: "Stopped smoking at least 35-40 years ago"  . Alcohol Use: Yes     Comment: Occasional monthly beer  . Drug Use: No  . Sexual Activity: Not on file   Other Topics Concern  . Not on file   Social History Narrative  . No narrative on file     Filed Vitals:   11/28/13 1144  BP: 183/61  Pulse: 59  Height: 5\' 10"  (1.778 m)  Weight: 181 lb (82.101 kg)    PHYSICAL EXAM General: NAD Neck: No JVD, no thyromegaly or thyroid  nodule.  Lungs: Clear to auscultation bilaterally with normal respiratory effort. CV: Nondisplaced PMI.  Heart regular S1/S2, no S3/S4, no murmur.  No peripheral edema.  No carotid bruit.  Normal pedal pulses.  Abdomen: Soft, nontender, no hepatosplenomegaly, no distention.  Neurologic: Alert and oriented x 3.  Psych: Normal affect. Extremities: No clubbing or cyanosis.   ECG: reviewed and available in electronic records.   IMPRESSION: Low risk Lexiscan Cardiolite. No diagnostic ST segment changes over baseline ECG abnormalities. Perfusion imaging is consistent with either soft tissue attenuation or scar affecting the inferolateral wall, no large ischemic defects however. LVEF is calculated at 59% without obvious wall motion abnormality and mildly increased LV volumes. This study would suggest significant improvement in LVEF compared to most recent echocardiogram.     ASSESSMENT AND PLAN: 1. Ischemic cardiomyopathy: His Lexi scan was encouraging, in that his systolic function appears to have significantly improved with a calculated LVEF of 59%. Perfusion imaging was consistent with soft tissue attenuation versus scar of the inferolateral wall, with no large ischemic defects seen. No recurrence of chest tightness with nausea.  2. 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 Continue carvedilol and torsemide. Again, the calculated LVEF of 59% suggests a significant improvement in LV systolic function. 3. HTN: reportedly well controlled. I will not make any adjustments to his current medication regimen.   Dispo: f/u in 4 months.   Kate Sable, M.D., F.A.C.C.

## 2013-11-29 ENCOUNTER — Ambulatory Visit: Payer: Medicare Other | Admitting: Cardiovascular Disease

## 2013-12-26 ENCOUNTER — Ambulatory Visit: Payer: Medicare Other | Admitting: Cardiovascular Disease

## 2014-03-31 ENCOUNTER — Encounter: Payer: Self-pay | Admitting: Adult Health

## 2014-03-31 ENCOUNTER — Ambulatory Visit (INDEPENDENT_AMBULATORY_CARE_PROVIDER_SITE_OTHER): Payer: Medicare Other | Admitting: Adult Health

## 2014-03-31 ENCOUNTER — Ambulatory Visit: Payer: Medicare Other | Admitting: Cardiovascular Disease

## 2014-03-31 VITALS — BP 128/60 | HR 60 | Ht 70.0 in | Wt 177.0 lb

## 2014-03-31 DIAGNOSIS — N179 Acute kidney failure, unspecified: Secondary | ICD-10-CM

## 2014-03-31 DIAGNOSIS — I10 Essential (primary) hypertension: Secondary | ICD-10-CM

## 2014-03-31 DIAGNOSIS — N189 Chronic kidney disease, unspecified: Secondary | ICD-10-CM

## 2014-03-31 DIAGNOSIS — I2589 Other forms of chronic ischemic heart disease: Secondary | ICD-10-CM

## 2014-03-31 DIAGNOSIS — I255 Ischemic cardiomyopathy: Secondary | ICD-10-CM

## 2014-03-31 NOTE — Progress Notes (Deleted)
Name: Alan Hawkins    DOB: [birth year]  Age: 73 y.o.  MR#: [id number]       PCP:  Lanette Hampshire, MD      Insurance: Payor: [employer] MEDICARE / Plan: AARP MEDICARE COMPLETE / Product Type: *No Product type* /   CC:    Chief Complaint  Patient presents with  . Congestive Heart Failure  . Cardiomyopathy  . Hypertension    VS Filed Vitals:   03/31/14 1529  BP: 128/60  Pulse: 60  Height: 5\' 10"  (1.778 m)  Weight: 177 lb (80.287 kg)    Weights Current Weight  03/31/14 177 lb (80.287 kg)  11/28/13 181 lb (82.101 kg)  11/04/13 186 lb (84.369 kg)    Blood Pressure  BP Readings from Last 3 Encounters:  03/31/14 128/60  11/28/13 183/61  11/04/13 170/72     Admit date:  (Not on file) Last encounter with RMR:  Visit date not found   Allergy Bee venom; Nitrofuran derivatives; and Nitroglycerin  Current Outpatient Prescriptions  Medication Sig Dispense Refill  . amLODipine (NORVASC) 10 MG tablet Take 10 mg by mouth daily.       Marland Kitchen aspirin EC 81 MG tablet Take 81 mg by mouth daily. Take by mouth daily.      . calcitRIOL (ROCALTROL) 0.5 MCG capsule       . carvedilol (COREG) 6.25 MG tablet Take 6.25 mg by mouth 2 (two) times daily with a meal.       . cloNIDine (CATAPRES) 0.2 MG tablet Take 0.2 mg by mouth 2 (two) times daily.       Marland Kitchen Co-Enzyme Q-10 30 MG CAPS Take 400 mg by mouth daily.       . ondansetron (ZOFRAN) 4 MG tablet Take 4 mg by mouth every 8 (eight) hours as needed.       Marland Kitchen oxyCODONE (ROXICODONE) 5 MG immediate release tablet Take 1 tablet (5 mg total) by mouth every 4 (four) hours as needed for severe pain.  15 tablet  0  . polyethylene glycol (MIRALAX) packet Take 17 g by mouth daily as needed (Constipation).      . sevelamer carbonate (RENVELA) 800 MG tablet Take 800 mg by mouth 3 (three) times daily with meals. 2 tabs with meals and 1 tab with snack      . torsemide (DEMADEX) 20 MG tablet Take 20-40 mg by mouth daily.        No current facility-administered  medications for this visit.   Facility-Administered Medications Ordered in Other Visits  Medication Dose Route Frequency Provider Last Rate Last Dose  . fentaNYL (SUBLIMAZE) injection 25-50 mcg  25-50 mcg Intravenous Q5 min PRN Lerry Liner, MD        Discontinued Meds:    Medications Discontinued During This Encounter  Medication Reason  . losartan (COZAAR) 50 MG tablet Error  . HYDROcodone-acetaminophen (NORCO/VICODIN) 5-325 MG per tablet Error    Patient Active Problem List   Diagnosis Date Noted  . Ischemic cardiomyopathy 09/20/2013  . Acute on chronic systolic heart failure 123456  . Acute on chronic renal failure 09/20/2013  . Essential hypertension, benign 09/20/2013  . SOB (shortness of breath) 09/20/2013  . CHF (congestive heart failure) 09/20/2013    LABS    Component Value Date/Time   NA 139 10/15/2013 0636   NA 139 09/27/2013 0642   NA 138 09/26/2013 0545   K 4.5 10/15/2013 0636   K 4.0 09/27/2013 0642   K 4.2 09/26/2013  0545   CL 106 09/27/2013 0642   CL 105 09/26/2013 0545   CL 105 09/25/2013 0441   CO2 19 09/27/2013 0642   CO2 20 09/26/2013 0545   CO2 20 09/25/2013 0441   GLUCOSE 110* 10/15/2013 0636   GLUCOSE 104* 09/27/2013 0642   GLUCOSE 103* 09/26/2013 0545   BUN 55* 09/27/2013 0642   BUN 57* 09/26/2013 0545   BUN 58* 09/25/2013 0441   CREATININE 7.03* 09/27/2013 0642   CREATININE 7.43* 09/26/2013 0545   CREATININE 7.82* 09/25/2013 0441   CALCIUM 8.9 09/27/2013 0642   CALCIUM 8.5 09/26/2013 0545   CALCIUM 8.5 09/25/2013 0441   CALCIUM 8.6 09/21/2013 1243   GFRNONAA 7* 09/27/2013 0642   GFRNONAA 6* 09/26/2013 0545   GFRNONAA 6* 09/25/2013 0441   GFRAA 8* 09/27/2013 0642   GFRAA 7* 09/26/2013 0545   GFRAA 7* 09/25/2013 0441   CMP     Component Value Date/Time   NA 139 10/15/2013 0636   K 4.5 10/15/2013 0636   CL 106 09/27/2013 0642   CO2 19 09/27/2013 0642   GLUCOSE 110* 10/15/2013 0636   BUN 55* 09/27/2013 0642   CREATININE 7.03*  09/27/2013 0642   CALCIUM 8.9 09/27/2013 0642   CALCIUM 8.6 09/21/2013 1243   PROT 7.0 09/20/2013 1748   ALBUMIN 3.3* 09/20/2013 1748   AST 11 09/20/2013 1748   ALT 11 09/20/2013 1748   ALKPHOS 60 09/20/2013 1748   BILITOT 0.4 09/20/2013 1748   GFRNONAA 7* 09/27/2013 0642   GFRAA 8* 09/27/2013 0642       Component Value Date/Time   WBC 4.5 09/26/2013 0545   WBC 5.0 09/24/2013 0424   WBC 6.6 09/22/2013 0451   HGB 10.2* 10/15/2013 0636   HGB 9.8* 09/26/2013 0545   HGB 9.9* 09/24/2013 0424   HCT 30.0* 10/15/2013 0636   HCT 29.5* 09/26/2013 0545   HCT 30.0* 09/24/2013 0424   MCV 91.9 09/26/2013 0545   MCV 92.3 09/24/2013 0424   MCV 91.5 09/22/2013 0451    Lipid Panel  No results found for this basename: chol, trig, hdl, cholhdl, vldl, ldlcalc    ABG No results found for this basename: phart, pco2, pco2art, po2, po2art, hco3, tco2, acidbasedef, o2sat     Lab Results  Component Value Date   TSH 2.228 09/20/2013   BNP (last 3 results)  Recent Labs  09/20/13 1748  PROBNP 57268.0*   Cardiac Panel (last 3 results) No results found for this basename: CKTOTAL, CKMB, TROPONINI, RELINDX,  in the last 72 hours  Iron/TIBC/Ferritin    Component Value Date/Time   IRON 54 09/21/2013 1243   TIBC 241 09/21/2013 1243   FERRITIN 349* 09/21/2013 1243     EKG Orders placed during the hospital encounter of 09/20/13  . EKG     Prior Assessment and Plan Problem List as of 03/31/2014     Cardiovascular and Mediastinum   Ischemic cardiomyopathy   Acute on chronic systolic heart failure   Essential hypertension, benign   CHF (congestive heart failure)     Genitourinary   Acute on chronic renal failure     Other   SOB (shortness of breath)       Imaging: No results found.

## 2014-03-31 NOTE — Assessment & Plan Note (Signed)
Followed by nephrology with dialysis 3 times a week. He is compliant with this. They follow his labs during their evaluation with ongoing dialysis and management.

## 2014-03-31 NOTE — Patient Instructions (Signed)
Your physician recommends that you schedule a follow-up appointment in: 1 year You will receive a reminder letter two months in advance reminding you to call and schedule your appointment. If you don't receive this letter, please contact our office.  Your physician recommends that you continue on your current medications as directed. Please refer to the Current Medication list given to you today.

## 2014-03-31 NOTE — Assessment & Plan Note (Signed)
Excellent control of blood pressure currently. Would continue Catapres, amlodipine, Cozaar, along with carvedilol. Ischemic in one year unless he is symptomatic. Refills are provided on all of his medications.

## 2014-03-31 NOTE — Assessment & Plan Note (Signed)
The patient is completely stable, he denies exertional dyspnea chest pain palpitations or dizziness. He remains on carvedilol 6.25 mg twice a day aspirin torsemide and ARB. Blood pressure is well-controlled. When I do any further cardiac testing at this time. A followup visit would like to repeat his echo for continued evaluation of her LV function. We will see him again in one year unless he is symptomatic. He follows his primary care physician and his nephrologist regularly. If he is having problems, we are happy to see him sooner

## 2014-03-31 NOTE — Progress Notes (Signed)
HPI [name] is a 73 year old patient of Dr. Pennelope Hawkins we are following for ongoing assessment and management of chronic systolic heart failure, ischemic cardiomyopathy, hypertension, with history of end-stage renal disease on dialysis. Patient was last seen in the office in February 2015.  He had a recent [place] which was low risk, there was a significant improvement in his LV systolic function with an EF of 59%. Blood pressure was well controlled on that visit. No changes were made in his regimen.  He comes today completely asymptomatic, remains active, compliant with dialysis, without any complaints of lower extremity edema worsening shortness of breath or chest pain. He states he feels great.    Allergies  Allergen Reactions  . Bee Venom Anaphylaxis  . Nitrofuran Derivatives Nausea And Vomiting    Nausea and vomiting , sever headache while on nitroglycerin infusion twice  . Nitroglycerin Other (See Comments)    Headache   nausea    Current Outpatient Prescriptions  Medication Sig Dispense Refill  . amLODipine (NORVASC) 10 MG tablet Take 10 mg by mouth daily.       Marland Kitchen aspirin EC 81 MG tablet Take 81 mg by mouth daily. Take by mouth daily.      . calcitRIOL (ROCALTROL) 0.5 MCG capsule       . carvedilol (COREG) 6.25 MG tablet Take 6.25 mg by mouth 2 (two) times daily with a meal.       . cloNIDine (CATAPRES) 0.2 MG tablet Take 0.2 mg by mouth 2 (two) times daily.       Marland Kitchen Co-Enzyme Q-10 30 MG CAPS Take 400 mg by mouth daily.       . ondansetron (ZOFRAN) 4 MG tablet Take 4 mg by mouth every 8 (eight) hours as needed.       Marland Kitchen oxyCODONE (ROXICODONE) 5 MG immediate release tablet Take 1 tablet (5 mg total) by mouth every 4 (four) hours as needed for severe pain.  15 tablet  0  . polyethylene glycol (MIRALAX) packet Take 17 g by mouth daily as needed (Constipation).      . sevelamer carbonate (RENVELA) 800 MG tablet Take 800 mg by mouth 3 (three) times daily with meals. 2 tabs with  meals and 1 tab with snack      . torsemide (DEMADEX) 20 MG tablet Take 20-40 mg by mouth daily.        No current facility-administered medications for this visit.   Facility-Administered Medications Ordered in Other Visits  Medication Dose Route Frequency Provider Last Rate Last Dose  . fentaNYL (SUBLIMAZE) injection 25-50 mcg  25-50 mcg Intravenous Q5 min PRN Lerry Liner, MD        Past Medical History  Diagnosis Date  . Essential hypertension, benign   . Hyperlipidemia   . GERD (gastroesophageal reflux disease)   . CKD (chronic kidney disease) stage 3, GFR 30-59 ml/min     Creatinine reportedly 2.0 six months ago  . CHF (congestive heart failure)   . Myocardial infarction     Past Surgical History  Procedure Laterality Date  . Cataract extraction w/phaco  11/17/2011    Procedure: CATARACT EXTRACTION PHACO AND INTRAOCULAR LENS PLACEMENT (IOC);  Surgeon: Tonny Branch, MD;  Location: [facility];  Service: Ophthalmology;  Laterality: Right;  CDE=16.17  . Eye surgery      R KPE w/ IOL  . Cataract extraction w/phaco  12/05/2011    Procedure: CATARACT EXTRACTION PHACO AND INTRAOCULAR LENS PLACEMENT (IOC);  Surgeon: Tonny Branch,  MD;  Location: [facility];  Service: Ophthalmology;  Laterality: Left;  CDE 16.22  . Vasectomy    . Insertion of dialysis catheter Right 10/15/2013    Procedure: INSERTION OF DIALYSIS CATHETER;  Surgeon: Conrad Lake Success, MD;  Location: [facility];  Service: Vascular;  Laterality: Right;    ROS: .Review of systems complete and found to be negative unless listed above PHYSICAL EXAM BP 128/60  Pulse 60  Ht 5\' 10"  (1.778 m)  Wt 177 lb (80.287 kg)  BMI 25.40 kg/m2 General: Well developed, well nourished, in no acute distress Head: Eyes PERRLA, No xanthomas.   Normal cephalic and atramatic  Lungs: Clear bilaterally to auscultation and percussion. Heart: HRRR S1 S2, without MRG.  Pulses are 2+ & equal.            No carotid bruit. No JVD.  No abdominal bruits. No femoral  bruits. Abdomen: Bowel sounds are positive, abdomen soft and non-tender without masses or                  Hernia's noted. Msk:  Back normal, normal gait. Normal strength and tone for age. Extremities: No clubbing, cyanosis or edema.  DP +1 Neuro: Alert and oriented X 3. Psych:  Good affect, responds appropriately    ASSESSMENT AND PLAN

## 2014-05-02 ENCOUNTER — Telehealth (INDEPENDENT_AMBULATORY_CARE_PROVIDER_SITE_OTHER): Payer: Self-pay | Admitting: *Deleted

## 2014-05-02 ENCOUNTER — Other Ambulatory Visit (INDEPENDENT_AMBULATORY_CARE_PROVIDER_SITE_OTHER): Payer: Self-pay | Admitting: *Deleted

## 2014-05-02 DIAGNOSIS — Z1211 Encounter for screening for malignant neoplasm of colon: Secondary | ICD-10-CM

## 2014-05-02 MED ORDER — PEG 3350-KCL-NA BICARB-NACL 420 G PO SOLR: 4000.0000 mL | Freq: Once | ORAL | Status: DC

## 2014-05-02 NOTE — Telephone Encounter (Signed)
agree

## 2014-05-02 NOTE — Telephone Encounter (Signed)
  Procedure: tcs  Reason/Indication:  screening  Has patient had this procedure before?  no  If so, when, by whom and where?    Is there a family history of colon cancer?  no  Who?  What age when diagnosed?    Is patient diabetic?   no      Does patient have prosthetic heart valve?  no  Do you have a pacemaker?  no  Has patient ever had endocarditis? no  Has patient had joint replacement within last 12 months?  no  Does patient tend to be constipated or take laxatives? yes  Is patient on Coumadin, Plavix and/or Aspirin? yes  Medications: see epic  Allergies: see epic  Medication Adjustment: asa 2 days  Procedure date & time: 05/20/14 at 20

## 2014-05-02 NOTE — Telephone Encounter (Signed)
Patient needs trilyte 

## 2014-05-10 DIAGNOSIS — C801 Malignant (primary) neoplasm, unspecified: Secondary | ICD-10-CM

## 2014-05-10 HISTORY — DX: Malignant (primary) neoplasm, unspecified: C80.1

## 2014-05-10 HISTORY — PX: OTHER SURGICAL HISTORY: SHX169

## 2014-05-13 ENCOUNTER — Encounter (HOSPITAL_COMMUNITY): Payer: Self-pay | Admitting: Pharmacy Technician

## 2014-05-20 ENCOUNTER — Encounter (HOSPITAL_COMMUNITY): Payer: Self-pay

## 2014-05-20 ENCOUNTER — Encounter (HOSPITAL_COMMUNITY)
Admission: RE | Disposition: A | Discharge status: Home / Self Care | Payer: Self-pay | Source: Ambulatory Visit | Attending: Internal Medicine

## 2014-05-20 ENCOUNTER — Ambulatory Visit (HOSPITAL_COMMUNITY)
Admission: RE | Admit: 2014-05-20 | Discharge: 2014-05-20 | Disposition: A | Discharge status: Home / Self Care | Payer: Medicare Other | Source: Ambulatory Visit | Attending: Internal Medicine | Admitting: Internal Medicine

## 2014-05-20 DIAGNOSIS — D126 Benign neoplasm of colon, unspecified: Secondary | ICD-10-CM | POA: Diagnosis not present

## 2014-05-20 DIAGNOSIS — K644 Residual hemorrhoidal skin tags: Secondary | ICD-10-CM | POA: Insufficient documentation

## 2014-05-20 DIAGNOSIS — K5730 Diverticulosis of large intestine without perforation or abscess without bleeding: Secondary | ICD-10-CM | POA: Diagnosis not present

## 2014-05-20 DIAGNOSIS — Q2733 Arteriovenous malformation of digestive system vessel: Secondary | ICD-10-CM | POA: Insufficient documentation

## 2014-05-20 DIAGNOSIS — Z1211 Encounter for screening for malignant neoplasm of colon: Secondary | ICD-10-CM

## 2014-05-20 DIAGNOSIS — K5520 Angiodysplasia of colon without hemorrhage: Secondary | ICD-10-CM

## 2014-05-20 HISTORY — PX: COLONOSCOPY: SHX5424

## 2014-05-20 SURGERY — COLONOSCOPY
Anesthesia: Moderate Sedation

## 2014-05-20 MED ORDER — MEPERIDINE HCL 50 MG/ML IJ SOLN
INTRAMUSCULAR | Status: DC | PRN
  Administered 2014-05-20 (×2): 25 mg via INTRAVENOUS

## 2014-05-20 MED ORDER — SIMETHICONE 40 MG/0.6ML PO SUSP
ORAL | Status: DC | PRN
  Administered 2014-05-20: 08:00:00

## 2014-05-20 MED ORDER — MIDAZOLAM HCL 5 MG/5ML IJ SOLN
INTRAMUSCULAR | Status: AC
  Filled 2014-05-20: qty 10

## 2014-05-20 MED ORDER — MIDAZOLAM HCL 5 MG/5ML IJ SOLN
INTRAMUSCULAR | Status: DC | PRN
  Administered 2014-05-20 (×2): 2 mg via INTRAVENOUS
  Administered 2014-05-20: 1 mg via INTRAVENOUS

## 2014-05-20 MED ORDER — SODIUM CHLORIDE 0.9 % IV SOLN
INTRAVENOUS | Status: DC
  Administered 2014-05-20: 07:00:00 via INTRAVENOUS

## 2014-05-20 MED ORDER — MEPERIDINE HCL 50 MG/ML IJ SOLN
INTRAMUSCULAR | Status: AC
  Filled 2014-05-20: qty 1

## 2014-05-20 NOTE — Discharge Instructions (Signed)
Resume medications and high fiber diet. No driving for 24 hours. Physician will call with biopsy results.  Colonoscopy, Care After Refer to this sheet in the next few weeks. These instructions provide you with information on caring for yourself after your procedure. Your health care provider may also give you more specific instructions. Your treatment has been planned according to current medical practices, but problems sometimes occur. Call your health care provider if you have any problems or questions after your procedure. WHAT TO EXPECT AFTER THE PROCEDURE  After your procedure, it is typical to have the following:  A small amount of blood in your stool.  Moderate amounts of gas and mild abdominal cramping or bloating. HOME CARE INSTRUCTIONS  Do not drive, operate machinery, or sign important documents for 24 hours.  You may shower and resume your regular physical activities, but move at a slower pace for the first 24 hours.  Take frequent rest periods for the first 24 hours.  Walk around or put a warm pack on your abdomen to help reduce abdominal cramping and bloating.  Drink enough fluids to keep your urine clear or pale yellow.  You may resume your normal diet as instructed by your health care provider. Avoid heavy or fried foods that are hard to digest.  Avoid drinking alcohol for 24 hours or as instructed by your health care provider.  Only take over-the-counter or prescription medicines as directed by your health care provider.  If a tissue sample (biopsy) was taken during your procedure:  Do not take aspirin or blood thinners for 7 days, or as instructed by your health care provider.  Do not drink alcohol for 7 days, or as instructed by your health care provider.  Eat soft foods for the first 24 hours. SEEK MEDICAL CARE IF: You have persistent spotting of blood in your stool 2-3 days after the procedure. SEEK IMMEDIATE MEDICAL CARE IF:  You have more than a small  spotting of blood in your stool.  You pass large blood clots in your stool.  Your abdomen is swollen (distended).  You have nausea or vomiting.  You have a fever.  You have increasing abdominal pain that is not relieved with medicine.  High-Fiber Diet Fiber is found in fruits, vegetables, and grains. A high-fiber diet encourages the addition of more whole grains, legumes, fruits, and vegetables in your diet. The recommended amount of fiber for adult males is 38 g per day. For adult females, it is 25 g per day. Pregnant and lactating women should get 28 g of fiber per day. If you have a digestive or bowel problem, ask your caregiver for advice before adding high-fiber foods to your diet. Eat a variety of high-fiber foods instead of only a select few type of foods.  PURPOSE  To increase stool bulk.  To make bowel movements more regular to prevent constipation.  To lower cholesterol.  To prevent overeating. WHEN IS THIS DIET USED?  It may be used if you have constipation and hemorrhoids.  It may be used if you have uncomplicated diverticulosis (intestine condition) and irritable bowel syndrome.  It may be used if you need help with weight management.  It may be used if you want to add it to your diet as a protective measure against atherosclerosis, diabetes, and cancer. SOURCES OF FIBER  Whole-grain breads and cereals.  Fruits, such as apples, oranges, bananas, berries, prunes, and pears.  Vegetables, such as green peas, carrots, sweet potatoes, beets, broccoli, cabbage,  spinach, and artichokes.  Legumes, such split peas, soy, lentils.  Almonds. FIBER CONTENT IN FOODS Starches and Grains / Dietary Fiber (g)  Cheerios, 1 cup / 3 g  Corn Flakes cereal, 1 cup / 0.7 g  Rice crispy treat cereal, 1 cup / 0.3 g  Instant oatmeal (cooked),  cup / 2 g  Frosted wheat cereal, 1 cup / 5.1 g  Brown, long-grain rice (cooked), 1 cup / 3.5 g  White, long-grain rice (cooked), 1  cup / 0.6 g  Enriched macaroni (cooked), 1 cup / 2.5 g Legumes / Dietary Fiber (g)  Baked beans (canned, plain, or vegetarian),  cup / 5.2 g  Kidney beans (canned),  cup / 6.8 g  Pinto beans (cooked),  cup / 5.5 g Breads and Crackers / Dietary Fiber (g)  Plain or honey graham crackers, 2 squares / 0.7 g  Saltine crackers, 3 squares / 0.3 g  Plain, salted pretzels, 10 pieces / 1.8 g  Whole-wheat bread, 1 slice / 1.9 g  White bread, 1 slice / 0.7 g  Raisin bread, 1 slice / 1.2 g  Plain bagel, 3 oz / 2 g  Flour tortilla, 1 oz / 0.9 g  Corn tortilla, 1 small / 1.5 g  Hamburger or hotdog bun, 1 small / 0.9 g Fruits / Dietary Fiber (g)  Apple with skin, 1 medium / 4.4 g  Sweetened applesauce,  cup / 1.5 g  Banana,  medium / 1.5 g  Grapes, 10 grapes / 0.4 g  Orange, 1 small / 2.3 g  Raisin, 1.5 oz / 1.6 g  Melon, 1 cup / 1.4 g Vegetables / Dietary Fiber (g)  Green beans (canned),  cup / 1.3 g  Carrots (cooked),  cup / 2.3 g  Broccoli (cooked),  cup / 2.8 g  Peas (cooked),  cup / 4.4 g  Mashed potatoes,  cup / 1.6 g  Lettuce, 1 cup / 0.5 g  Corn (canned),  cup / 1.6 g  Tomato,  cup / 1.1 g

## 2014-05-20 NOTE — Op Note (Signed)
COLONOSCOPY PROCEDURE REPORT  PATIENT:  Alan Hawkins  MR#:  [id number] Birthdate:  1940/11/17, 73 y.o., male Endoscopist:  Dr. Rogene Houston, MD Referred By:  Dr. Starr Sinclair. Everette Rank, MD  Procedure Date: 05/20/2014  Procedure:   Colonoscopy  Indications:  Patient is a 73 year old Caucasian male who is undergoing a screening colonoscopy. This is patient's first exam.  Informed Consent:  The procedure and risks were reviewed with the patient and informed consent was obtained.  Medications:  Demerol 50 mg IV Versed 5 mg IV  Description of procedure:  After a digital rectal exam was performed, that colonoscope was advanced from the anus through the rectum and colon to the area of the cecum, ileocecal valve and appendiceal orifice. The cecum was deeply intubated. These structures were well-seen and photographed for the record. From the level of the cecum and ileocecal valve, the scope was slowly and cautiously withdrawn. The mucosal surfaces were carefully surveyed utilizing scope tip to flexion to facilitate fold flattening as needed. The scope was pulled down into the rectum where a thorough exam including retroflexion was performed.  Findings:   Prep excellent. Moderate number of diverticula noted at sigmoid colon with a few more scattered and rest of the colon. 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 5 mm polyp cold snared from proximal transverse colon. Normal rectal mucosa. Small hemorrhoids below the dentate line.   Therapeutic/Diagnostic Maneuvers Performed:  See above  Complications:  None  Cecal Withdrawal Time:  7  minutes  Impression:  Examination performed to cecum. Pancolonic diverticulosis. Most of the diverticula are located at sigmoid colon. 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 Small polyp cold snared from proximal transverse colon. External hemorrhoids  Recommendations:  Standard instructions given. High fiber diet. I  will contact patient with biopsy results and further recommendations.  REHMAN,NAJEEB U  05/20/2014 8:11 AM  CC: Dr. Lanette Hampshire, MD & Dr. Rayne Du ref. provider found

## 2014-05-20 NOTE — H&P (Signed)
Alan Hawkins is an 73 y.o. male.   Chief Complaint: Patient is here for colonoscopy. HPI: Patient is 73 year old Caucasian male who is here for screening colonoscopy. He denies abdominal pain change in bowel habits or rectal bleeding. This is patient's first exam. Family history is negative for CRC.  Past Medical History  Diagnosis Date  . Essential hypertension, benign   . Hyperlipidemia   . GERD (gastroesophageal reflux disease)   . CKD (chronic kidney disease) stage 3, GFR 30-59 ml/min     Creatinine reportedly 2.0 six months ago  . CHF (congestive heart failure)   . Myocardial infarction     Past Surgical History  Procedure Laterality Date  . Cataract extraction w/phaco  11/17/2011    Procedure: CATARACT EXTRACTION PHACO AND INTRAOCULAR LENS PLACEMENT (IOC);  Surgeon: Tonny Branch, MD;  Location: [facility];  Service: Ophthalmology;  Laterality: Right;  CDE=16.17  . Eye surgery      R KPE w/ IOL  . Cataract extraction w/phaco  12/05/2011    Procedure: CATARACT EXTRACTION PHACO AND INTRAOCULAR LENS PLACEMENT (IOC);  Surgeon: Tonny Branch, MD;  Location: [facility];  Service: Ophthalmology;  Laterality: Left;  CDE 16.22  . Vasectomy    . Insertion of dialysis catheter Right 10/15/2013    Procedure: INSERTION OF DIALYSIS CATHETER;  Surgeon: Conrad Waleska, MD;  Location: [place];  Service: Vascular;  Laterality: Right;    Family History  Problem Relation Age of Onset  . Heart disease Mother   . Heart disease Sister   . Stroke Father    Social History:  reports that he has quit smoking. His smoking use included Cigarettes. He smoked 0.00 packs per day. He has never used smokeless tobacco. He reports that he drinks alcohol. He reports that he does not use illicit drugs.  Allergies:  Allergies  Allergen Reactions  . Bee Venom Anaphylaxis  . Nitrofuran Derivatives Nausea And Vomiting    Nausea and vomiting , sever headache while on nitroglycerin infusion twice  . Nitroglycerin Other (See  Comments)    Headache   nausea    Medications Prior to Admission  Medication Sig Dispense Refill  . amLODipine (NORVASC) 10 MG tablet Take 10 mg by mouth daily.       Marland Kitchen aspirin EC 81 MG tablet Take 81 mg by mouth daily. Take by mouth daily.      . carvedilol (COREG) 6.25 MG tablet Take 6.25 mg by mouth 2 (two) times daily with a meal.       . cloNIDine (CATAPRES) 0.2 MG tablet Take 0.2 mg by mouth 2 (two) times daily.       Marland Kitchen Co-Enzyme Q-10 30 MG CAPS Take 30 mg by mouth daily.       . ondansetron (ZOFRAN) 4 MG tablet Take 4 mg by mouth every 6 (six) hours as needed for nausea or vomiting.       Marland Kitchen oxyCODONE (ROXICODONE) 5 MG immediate release tablet Take 1 tablet (5 mg total) by mouth every 4 (four) hours as needed for severe pain.  15 tablet  0  . polyethylene glycol (MIRALAX) packet Take 17 g by mouth daily as needed. Take 17 g by mouth daily as needed (Constipation).      . polyethylene glycol-electrolytes (TRILYTE) 420 G solution Take 4,000 mLs by mouth once.  4000 mL  0  . sevelamer carbonate (RENVELA) 800 MG tablet Take 800-1,600 mg by mouth 3 (three) times daily with meals. 2 tabs with meals  and 1 tab with snack      . torsemide (DEMADEX) 20 MG tablet Take 20 mg by mouth daily.         No results found for this or any previous visit (from the past 48 hour(s)). No results found.  ROS  Blood pressure 200/108, pulse 112, temperature 98.1 F (36.7 C), temperature source Oral, height 5\' 10"  (1.778 m), weight 175 lb (79.379 kg), SpO2 98.00%. Physical Exam  Constitutional: He appears well-developed and well-nourished.  HENT:  Mouth/Throat: Oropharynx is clear and moist.  Eyes: Conjunctivae are normal. No scleral icterus.  Neck: No thyromegaly present.  Cardiovascular: Normal rate, regular rhythm and normal heart sounds.   Murmur: faint systolic ejection murmur at aortic area. Respiratory: Effort normal and breath sounds normal.  GI: Soft. He exhibits no distension and no mass.  There is no tenderness.  Musculoskeletal: He exhibits no edema.  Lymphadenopathy:    He has no cervical adenopathy.  Neurological: He is alert.  Skin: Skin is warm and dry.     Assessment/Plan Average risk screening colonoscopy.  Alan Hawkins,Alan Hawkins 05/20/2014, 7:39 AM

## 2014-05-26 ENCOUNTER — Encounter (HOSPITAL_COMMUNITY): Payer: Self-pay | Admitting: Internal Medicine

## 2014-05-28 ENCOUNTER — Encounter (INDEPENDENT_AMBULATORY_CARE_PROVIDER_SITE_OTHER): Payer: Self-pay | Admitting: *Deleted

## 2014-07-14 ENCOUNTER — Encounter: Payer: Self-pay | Admitting: Vascular Surgery

## 2014-07-15 ENCOUNTER — Ambulatory Visit (INDEPENDENT_AMBULATORY_CARE_PROVIDER_SITE_OTHER): Payer: Medicare Other | Admitting: Vascular Surgery

## 2014-07-15 ENCOUNTER — Encounter: Payer: Self-pay | Admitting: Vascular Surgery

## 2014-07-15 VITALS — BP 115/67 | HR 58 | Resp 18 | Ht 71.0 in | Wt 175.6 lb

## 2014-07-15 DIAGNOSIS — I714 Abdominal aortic aneurysm, without rupture, unspecified: Secondary | ICD-10-CM

## 2014-07-15 NOTE — Progress Notes (Signed)
Patient name: Alan Hawkins MRN: [id number] DOB: [birth year] Sex: male   Referred by: Everette Rank  Reason for referral:  Chief Complaint  Patient presents with  . New Evaluation    dialysis on M/W/F  referred from Dr. Everette Rank    . AAA    HISTORY OF PRESENT ILLNESS: Patient is a very pleasant 73 year old gentleman seen today for evaluation of abdominal aortic aneurysm. He is new to end-stage renal disease and had hemodialysis catheter placed by Dr. Bridgett Larsson on 10/15/2013 he subsequently underwent left upper arm AV fistula creation in [facility] and has had excellent restoration and excellent Lani Havlik function of his left upper arm fistula. He was being evaluated for potential transplant. He had an ultrasound of his aorta his initial evaluation of this and this revealed a infrarenal abdominal aortic aneurysm. He successfully underwent CT scan of his abdomen and pelvis on 07/03/2014 and had this for review as well. This did show a 5.9 cm infrarenal aortic aneurysm. There is no evidence of iliac artery aneurysms. The patient had no prior knowledge of his aneurysm. He does report a neighbor died from a ruptured aneurysm he is familiar with the disease process. He does have hypertension and hyperlipidemia. Does have a history of prior myocardial infarction and congestive heart failure. He is quite active well compensated.  Past Medical History  Diagnosis Date  . Essential hypertension, benign   . Hyperlipidemia   . GERD (gastroesophageal reflux disease)   . CKD (chronic kidney disease) stage 3, GFR 30-59 ml/min     Creatinine reportedly 2.0 six months ago  . CHF (congestive heart failure)   . Myocardial infarction   . AAA (abdominal aortic aneurysm)   . Anemia   . Cancer 05-2014    basal cell carcinoma right ear    Past Surgical History  Procedure Laterality Date  . Cataract extraction w/phaco  11/17/2011    Procedure: CATARACT EXTRACTION PHACO AND INTRAOCULAR LENS PLACEMENT  (IOC);  Surgeon: Tonny Branch, MD;  Location: [facility];  Service: Ophthalmology;  Laterality: Right;  CDE=16.17  . Eye surgery      R KPE w/ IOL  . Cataract extraction w/phaco  12/05/2011    Procedure: CATARACT EXTRACTION PHACO AND INTRAOCULAR LENS PLACEMENT (IOC);  Surgeon: Tonny Branch, MD;  Location: [facility];  Service: Ophthalmology;  Laterality: Left;  CDE 16.22  . Vasectomy    . Insertion of dialysis catheter Right 10/15/2013    Procedure: INSERTION OF DIALYSIS CATHETER;  Surgeon: Conrad Broomall, MD;  Location: [place];  Service: Vascular;  Laterality: Right;  . Colonoscopy N/A 05/20/2014    Procedure: COLONOSCOPY;  Surgeon: Rogene Houston, MD;  [facility];  Service: Endoscopy;  Laterality: N/A;  730  . Excision of basal cell carcinoma right ear Right 05-2014    History   Social History  . Marital Status: Married    Spouse Name: N/A    Number of Children: N/A  . Years of Education: N/A   Occupational History  . Not on file.   Social History Main Topics  . Smoking status: Former Smoker    Types: Cigarettes  . Smokeless tobacco: Never Used     Comment: "Stopped smoking at least 35-40 years ago"  . Alcohol Use: Yes     Comment: Occasional monthly beer  . Drug Use: No  . Sexual Activity: Not on file   Other Topics Concern  . Not on file   Social History Narrative  .  No narrative on file    Family History  Problem Relation Age of Onset  . Heart disease Mother   . Cancer Mother   . Heart disease Sister   . Heart attack Sister   . Stroke Father   . Hypertension Son     Allergies as of 07/15/2014 - Review Complete 07/15/2014  Allergen Reaction Noted  . Bee venom Anaphylaxis 09/20/2013  . Nitrofuran derivatives Nausea And Vomiting 09/22/2013  . Nitroglycerin Other (See Comments) 11/28/2013    Current Outpatient Prescriptions on File Prior to Visit  Medication Sig Dispense Refill  . amLODipine (NORVASC) 10 MG tablet Take 10 mg by mouth daily.       Marland Kitchen aspirin EC  81 MG tablet Take 81 mg by mouth daily. Take by mouth daily.      . carvedilol (COREG) 6.25 MG tablet Take 6.25 mg by mouth 2 (two) times daily with a meal.       . cloNIDine (CATAPRES) 0.2 MG tablet Take 0.2 mg by mouth 2 (two) times daily.       Marland Kitchen Co-Enzyme Q-10 30 MG CAPS Take 30 mg by mouth daily.       . ondansetron (ZOFRAN) 4 MG tablet Take 4 mg by mouth every 6 (six) hours as needed for nausea or vomiting.       Marland Kitchen oxyCODONE (ROXICODONE) 5 MG immediate release tablet Take 1 tablet (5 mg total) by mouth every 4 (four) hours as needed for severe pain.  15 tablet  0  . polyethylene glycol (MIRALAX) packet Take 17 g by mouth daily as needed. Take 17 g by mouth daily as needed (Constipation).      . sevelamer carbonate (RENVELA) 800 MG tablet Take 800-1,600 mg by mouth 3 (three) times daily with meals. 2 tabs with meals and 1 tab with snack      . torsemide (DEMADEX) 20 MG tablet Take 20 mg by mouth daily.        Current Facility-Administered Medications on File Prior to Visit  Medication Dose Route Frequency Provider Last Rate Last Dose  . fentaNYL (SUBLIMAZE) injection 25-50 mcg  25-50 mcg Intravenous Q5 min PRN Lerry Liner, MD         REVIEW OF SYSTEMS:  Positives indicated with an "X"  CARDIOVASCULAR:  [ ]  chest pain   [ ]  chest pressure   [ ]  palpitations   [ ]  orthopnea   [ ]  dyspnea on exertion   [ ]  claudication   [ ]  rest pain   [ ]  DVT   [ ]  phlebitis PULMONARY:   [ ]  productive cough   [ ]  asthma   [ ]  wheezing NEUROLOGIC:   [ ]  weakness  [ ]  paresthesias  [ ]  aphasia  [ ]  amaurosis  [ ]  dizziness HEMATOLOGIC:   [ ]  bleeding problems   [ ]  clotting disorders MUSCULOSKELETAL:  [ ]  joint pain   [ ]  joint swelling GASTROINTESTINAL: [ ]   blood in stool  [ ]   hematemesis GENITOURINARY:  [ ]   dysuria  [ ]   hematuria PSYCHIATRIC:  [ ]  history of major depression INTEGUMENTARY:  [ ]  rashes  [ ]  ulcers CONSTITUTIONAL:  [ ]  fever   [ ]  chills  PHYSICAL EXAMINATION:  General: The  patient is a well-nourished male, in no acute distress. Vital signs are BP 115/67  Pulse 58  Resp 18  Ht 5\' 11"  (1.803 m)  Wt 175 lb 9.6 oz (79.652 kg)  BMI  24.50 kg/m2 Pulmonary: There is a good air exchange bilaterally without wheezing or rales. Abdomen: Soft and non-tender with normal pitch bowel sounds. Easily palpable aneurysm which is nontender to him. Musculoskeletal: There are no major deformities.  There is no significant extremity pain. Neurologic: No focal weakness or paresthesias are detected, Skin: There are no ulcer or rashes noted. Psychiatric: The patient has normal affect. Cardiovascular: There is a regular rate and rhythm without significant murmur appreciated. Pulse status: 2+ radial 2+ femoral 2+ popliteal pulses and 1-2+ posterior tibial pulses with no evidence of peripheral aneurysm Well-developed left upper arm cephalic vein fistula  I reviewed his CT films and discuss this with the patient. He does have appropriate anatomy for stent graft repair   Impression and Plan:  5.9 cm infrarenal abdominal aortic aneurysm. Have discussed the significance of this with the patient and his wife. I have recommended elective repair. I did discuss Mr. Trobaugh with the transplant surgeon, Dr.Farney at [facility]. We are in agreement to proceed with repair of his potentially life-threatening aneurysm and then reevaluate possibility of transplant. The stent graft should not preclude him from a transplant if appropriate. We will size the stent graft and discuss elective repair with the patient following this    Hussam Muniz [facility] Office: [phone]

## 2014-07-16 ENCOUNTER — Other Ambulatory Visit: Payer: Self-pay

## 2014-07-22 ENCOUNTER — Encounter: Payer: Self-pay | Admitting: Vascular Surgery

## 2014-07-22 ENCOUNTER — Encounter (HOSPITAL_COMMUNITY): Payer: Self-pay | Admitting: Pharmacy Technician

## 2014-07-29 ENCOUNTER — Encounter (HOSPITAL_COMMUNITY): Payer: Self-pay

## 2014-07-29 ENCOUNTER — Encounter (HOSPITAL_COMMUNITY)
Admission: RE | Admit: 2014-07-29 | Discharge: 2014-07-29 | Disposition: A | Discharge status: Home / Self Care | Payer: Medicare Other | Source: Ambulatory Visit | Attending: Vascular Surgery | Admitting: Vascular Surgery

## 2014-07-29 DIAGNOSIS — R011 Cardiac murmur, unspecified: Secondary | ICD-10-CM | POA: Insufficient documentation

## 2014-07-29 DIAGNOSIS — Z01818 Encounter for other preprocedural examination: Secondary | ICD-10-CM | POA: Insufficient documentation

## 2014-07-29 DIAGNOSIS — E785 Hyperlipidemia, unspecified: Secondary | ICD-10-CM | POA: Insufficient documentation

## 2014-07-29 DIAGNOSIS — I509 Heart failure, unspecified: Secondary | ICD-10-CM | POA: Diagnosis not present

## 2014-07-29 DIAGNOSIS — N189 Chronic kidney disease, unspecified: Secondary | ICD-10-CM | POA: Diagnosis not present

## 2014-07-29 DIAGNOSIS — I10 Essential (primary) hypertension: Secondary | ICD-10-CM | POA: Diagnosis not present

## 2014-07-29 DIAGNOSIS — I714 Abdominal aortic aneurysm, without rupture: Secondary | ICD-10-CM | POA: Insufficient documentation

## 2014-07-29 HISTORY — DX: Cardiac murmur, unspecified: R01.1

## 2014-07-29 HISTORY — DX: Unspecified osteoarthritis, unspecified site: M19.90

## 2014-07-29 LAB — COMPREHENSIVE METABOLIC PANEL
ALT: 10 U/L (ref 0–53)
AST: 12 U/L (ref 0–37)
Albumin: 3.5 g/dL (ref 3.5–5.2)
Alkaline Phosphatase: 83 U/L (ref 39–117)
Anion gap: 16 — ABNORMAL HIGH (ref 5–15)
BUN: 32 mg/dL — ABNORMAL HIGH (ref 6–23)
CALCIUM: 9.1 mg/dL (ref 8.4–10.5)
CO2: 21 mEq/L (ref 19–32)
CREATININE: 6.72 mg/dL — AB (ref 0.50–1.35)
Chloride: 100 mEq/L (ref 96–112)
GFR calc Af Amer: 8 mL/min — ABNORMAL LOW (ref >90)
GFR, EST NON AFRICAN AMERICAN: 7 mL/min — AB (ref >90)
Glucose, Bld: 152 mg/dL — ABNORMAL HIGH (ref 70–99)
Potassium: 5 mEq/L (ref 3.7–5.3)
Sodium: 137 mEq/L (ref 137–147)
Total Bilirubin: 0.5 mg/dL (ref 0.3–1.2)
Total Protein: 7.4 g/dL (ref 6.0–8.3)

## 2014-07-29 LAB — BLOOD GAS, ARTERIAL
ACID-BASE EXCESS: 1 mmol/L (ref 0.0–2.0)
Bicarbonate: 24.8 mEq/L — ABNORMAL HIGH (ref 20.0–24.0)
DRAWN BY: 206361
FIO2: 0.21 %
O2 SAT: 96.7 %
PATIENT TEMPERATURE: 98.6
TCO2: 25.9 mmol/L (ref 0–100)
pCO2 arterial: 37.3 mmHg (ref 35.0–45.0)
pH, Arterial: 7.437 (ref 7.350–7.450)
pO2, Arterial: 89 mmHg (ref 80.0–100.0)

## 2014-07-29 LAB — URINALYSIS, ROUTINE W REFLEX MICROSCOPIC
BILIRUBIN URINE: NEGATIVE
GLUCOSE, UA: 100 mg/dL — AB
HGB URINE DIPSTICK: NEGATIVE
Ketones, ur: NEGATIVE mg/dL
Leukocytes, UA: NEGATIVE
Nitrite: NEGATIVE
PH: 8.5 — AB (ref 5.0–8.0)
Protein, ur: 100 mg/dL — AB
Specific Gravity, Urine: 1.009 (ref 1.005–1.030)
Urobilinogen, UA: 0.2 mg/dL (ref 0.0–1.0)

## 2014-07-29 LAB — APTT: APTT: 31 s (ref 24–37)

## 2014-07-29 LAB — PROTIME-INR
INR: 1.1 (ref 0.00–1.49)
PROTHROMBIN TIME: 14.3 s (ref 11.6–15.2)

## 2014-07-29 LAB — TYPE AND SCREEN
ABO/RH(D): AB POS
Antibody Screen: NEGATIVE

## 2014-07-29 LAB — CBC
HCT: 36.7 % — ABNORMAL LOW (ref 39.0–52.0)
Hemoglobin: 12 g/dL — ABNORMAL LOW (ref 13.0–17.0)
MCH: 30.7 pg (ref 26.0–34.0)
MCHC: 32.7 g/dL (ref 30.0–36.0)
MCV: 93.9 fL (ref 78.0–100.0)
PLATELETS: 216 10*3/uL (ref 150–400)
RBC: 3.91 MIL/uL — ABNORMAL LOW (ref 4.22–5.81)
RDW: 13 % (ref 11.5–15.5)
WBC: 6 10*3/uL (ref 4.0–10.5)

## 2014-07-29 LAB — SURGICAL PCR SCREEN
MRSA, PCR: POSITIVE — AB
Staphylococcus aureus: POSITIVE — AB

## 2014-07-29 LAB — ABO/RH: ABO/RH(D): AB POS

## 2014-07-29 LAB — URINE MICROSCOPIC-ADD ON

## 2014-07-29 NOTE — Progress Notes (Signed)
Primary - dr. Emilee Hero Cardiologist - dr.konswanian Had ekg, chest xray, ct of chest/abdomen in [facility] for transplant workup= will request

## 2014-07-29 NOTE — Progress Notes (Signed)
[facility] called [facility] per patient request for mupirocin

## 2014-07-29 NOTE — Pre-Procedure Instructions (Signed)
PAO GHAN  07/29/2014   Your procedure is scheduled on:  Monday, October 26th  Report to [facility] Admitting at 530 AM.  Call this number if you have problems the morning of surgery: [phone]   Remember:   Do not eat food or drink liquids after midnight.   Take these medicines the morning of surgery with A SIP OF WATER: norvasc, coreg, clonidine, zofran if needed   Do not wear jewelry.  Do not wear lotions, powders, or perfumes. You may wear deodorant.  Do not shave 48 hours prior to surgery. Men may shave face and neck.  Do not bring valuables to the hospital.  [facility] is not responsible  for any belongings or valuables.               Contacts, dentures or bridgework may not be worn into surgery.  Leave suitcase in the car. After surgery it may be brought to your room.  For patients admitted to the hospital, discharge time is determined by your treatment team.               Patients discharged the day of surgery will not be allowed to drive home.  Please read over the following fact sheets that you were given: Pain Booklet, Coughing and Deep Breathing, Blood Transfusion Information, MRSA Information and Surgical Site Infection Prevention [place] - Preparing for Surgery  Before surgery, you can play an important role.  Because skin is not sterile, your skin needs to be as free of germs as possible.  You can reduce the number of germs on you skin by washing with CHG (chlorahexidine gluconate) soap before surgery.  CHG is an antiseptic cleaner which kills germs and bonds with the skin to continue killing germs even after washing.  Please DO NOT use if you have an allergy to CHG or antibacterial soaps.  If your skin becomes reddened/irritated stop using the CHG and inform your nurse when you arrive at Short Stay.  Do not shave (including legs and underarms) for at least 48 hours prior to the first CHG shower.  You may shave your face.  Please follow these  instructions carefully:   1.  Shower with CHG Soap the night before surgery and the morning of Surgery.  2.  If you choose to wash your hair, wash your hair first as usual with your normal shampoo.  3.  After you shampoo, rinse your hair and body thoroughly to remove the shampoo.  4.  Use CHG as you would any other liquid soap.  You can apply CHG directly to the skin and wash gently with scrungie or a clean washcloth.  5.  Apply the CHG Soap to your body ONLY FROM THE NECK DOWN.  Do not use on open wounds or open sores.  Avoid contact with your eyes, ears, mouth and genitals (private parts).  Wash genitals (private parts) with your normal soap.  6.  Wash thoroughly, paying special attention to the area where your surgery will be performed.  7.  Thoroughly rinse your body with warm water from the neck down.  8.  DO NOT shower/wash with your normal soap after using and rinsing off the CHG Soap.  9.  Pat yourself dry with a clean towel.            10.  Wear clean pajamas.            11.  Place clean sheets on your bed the  night of your first shower and do not sleep with pets.  Day of Surgery  Do not apply any lotions/deoderants the morning of surgery.  Please wear clean clothes to the hospital/surgery center.

## 2014-07-30 NOTE — Progress Notes (Signed)
Anesthesia Chart Review:   Pt is 73 year old male scheduled for endovascular stent graft repair of abdominal aortic aneurysm on 08/04/14 with Dr. Donnetta Hutching.   Please see Allyn Kenner progress note dated 10/14/2013.  PMH: HTN, CHF, CKD (hemodialysis). Heart murmur, hyperlipidemia, AAA.   Medications include: amlodipine, carvediolol, clonidine, torsemide, renvela.   Preoperative labs reviewed.    Chest x-ray dated 07/03/2014 is in care everywhere. No radiographic evidence for acute cardiopulmonary disease.  EKG 09/20/2013: SR with 1st degree AVB, possible LAE, RBBB.   He was hospitalized at [facility] in mid December 2014 with acute on chronic CHF with worsening chronic renal failure. On the day of his admission (09/20/13), he was sent to [facility] for an urgent echocardiogram by his PCP Dr. Everette Rank. Results showed mild to moderate LVH with LVEF approximately 35-40%, anterolateral and inferolateral wall motion abnormalities consistent with ischemic cardiomyopathy, grade 1 diastolic dysfunction with increased filling pressures. He had evidence of mild aortic regurgitation, mild mitral regurgitation, and moderate pulmonary hypertension with PASP 51 mm of mercury. Trivial to small pericardial effusion was also noted.  Lexiscan cardiolite stress test 11/14/2013 was normal and per Dr. Court Joy progress note dated 11/28/2013, "Low risk Lexiscan Cardiolite. No diagnostic ST segment changes over baseline ECG abnormalities. Perfusion imaging is consistent with either soft tissue attenuation or scar affecting the inferolateral wall, no large ischemic defects however. LVEF is calculated at 59% without obvious wall motion abnormality and mildly increased LV volumes. This study would suggest significant improvement in LVEF compared to most recent echocardiogram."  Last cardiology visit was 03/31/2014 and plan at that time was for follow up in one year.   Discussed with Dr. Therisa Doyne.   If no  changes, I anticipate pt can proceed with surgery as scheduled.   Willeen Cass, FNP-BC [facility] Short Stay Surgical Center/Anesthesiology Phone: [phone] 07/30/2014 3:45 PM

## 2014-08-03 MED ORDER — DEXTROSE 5 % IV SOLN
1.5000 g | INTRAVENOUS | Status: AC
  Administered 2014-08-04: 1.5 g via INTRAVENOUS
  Filled 2014-08-03: qty 1.5

## 2014-08-04 ENCOUNTER — Encounter (HOSPITAL_COMMUNITY)
Admission: RE | Disposition: A | Discharge status: Home / Self Care | Payer: Self-pay | Source: Ambulatory Visit | Attending: Vascular Surgery

## 2014-08-04 ENCOUNTER — Inpatient Hospital Stay (HOSPITAL_COMMUNITY)
Admission: RE | Admit: 2014-08-04 | Discharge: 2014-08-05 | DRG: 268 | Disposition: A | Discharge status: Home / Self Care | Payer: Medicare Other | Source: Ambulatory Visit | Attending: Vascular Surgery | Admitting: Vascular Surgery

## 2014-08-04 ENCOUNTER — Inpatient Hospital Stay (HOSPITAL_COMMUNITY): Payer: Medicare Other

## 2014-08-04 ENCOUNTER — Encounter (HOSPITAL_COMMUNITY): Payer: Medicare Other | Admitting: Emergency Medicine

## 2014-08-04 ENCOUNTER — Other Ambulatory Visit: Payer: Self-pay | Admitting: *Deleted

## 2014-08-04 ENCOUNTER — Inpatient Hospital Stay (HOSPITAL_COMMUNITY): Payer: Medicare Other | Admitting: Certified Registered Nurse Anesthetist

## 2014-08-04 ENCOUNTER — Encounter (HOSPITAL_COMMUNITY): Payer: Self-pay | Admitting: *Deleted

## 2014-08-04 DIAGNOSIS — I714 Abdominal aortic aneurysm, without rupture, unspecified: Secondary | ICD-10-CM | POA: Diagnosis present

## 2014-08-04 DIAGNOSIS — D631 Anemia in chronic kidney disease: Secondary | ICD-10-CM | POA: Diagnosis present

## 2014-08-04 DIAGNOSIS — Z95828 Presence of other vascular implants and grafts: Secondary | ICD-10-CM

## 2014-08-04 DIAGNOSIS — Z01818 Encounter for other preprocedural examination: Secondary | ICD-10-CM

## 2014-08-04 DIAGNOSIS — I509 Heart failure, unspecified: Secondary | ICD-10-CM | POA: Diagnosis present

## 2014-08-04 DIAGNOSIS — Z79899 Other long term (current) drug therapy: Secondary | ICD-10-CM

## 2014-08-04 DIAGNOSIS — I252 Old myocardial infarction: Secondary | ICD-10-CM | POA: Diagnosis not present

## 2014-08-04 DIAGNOSIS — N186 End stage renal disease: Secondary | ICD-10-CM | POA: Diagnosis present

## 2014-08-04 DIAGNOSIS — Z992 Dependence on renal dialysis: Secondary | ICD-10-CM | POA: Diagnosis not present

## 2014-08-04 DIAGNOSIS — Z48812 Encounter for surgical aftercare following surgery on the circulatory system: Secondary | ICD-10-CM

## 2014-08-04 DIAGNOSIS — K219 Gastro-esophageal reflux disease without esophagitis: Secondary | ICD-10-CM | POA: Diagnosis present

## 2014-08-04 DIAGNOSIS — Z8679 Personal history of other diseases of the circulatory system: Secondary | ICD-10-CM

## 2014-08-04 DIAGNOSIS — I120 Hypertensive chronic kidney disease with stage 5 chronic kidney disease or end stage renal disease: Secondary | ICD-10-CM | POA: Diagnosis present

## 2014-08-04 DIAGNOSIS — Z87891 Personal history of nicotine dependence: Secondary | ICD-10-CM

## 2014-08-04 DIAGNOSIS — Z85828 Personal history of other malignant neoplasm of skin: Secondary | ICD-10-CM | POA: Diagnosis not present

## 2014-08-04 DIAGNOSIS — E785 Hyperlipidemia, unspecified: Secondary | ICD-10-CM | POA: Diagnosis present

## 2014-08-04 HISTORY — PX: ABDOMINAL AORTIC ENDOVASCULAR STENT GRAFT: SHX5707

## 2014-08-04 LAB — PROTIME-INR
INR: 1.15 (ref 0.00–1.49)
Prothrombin Time: 14.8 seconds (ref 11.6–15.2)

## 2014-08-04 LAB — CBC
HEMATOCRIT: 29.5 % — AB (ref 39.0–52.0)
HEMOGLOBIN: 9.8 g/dL — AB (ref 13.0–17.0)
MCH: 30.9 pg (ref 26.0–34.0)
MCHC: 33.2 g/dL (ref 30.0–36.0)
MCV: 93.1 fL (ref 78.0–100.0)
Platelets: 170 10*3/uL (ref 150–400)
RBC: 3.17 MIL/uL — ABNORMAL LOW (ref 4.22–5.81)
RDW: 12.6 % (ref 11.5–15.5)
WBC: 5.6 10*3/uL (ref 4.0–10.5)

## 2014-08-04 LAB — BASIC METABOLIC PANEL
Anion gap: 16 — ABNORMAL HIGH (ref 5–15)
BUN: 54 mg/dL — AB (ref 6–23)
CO2: 19 mEq/L (ref 19–32)
Calcium: 8.2 mg/dL — ABNORMAL LOW (ref 8.4–10.5)
Chloride: 105 mEq/L (ref 96–112)
Creatinine, Ser: 9.32 mg/dL — ABNORMAL HIGH (ref 0.50–1.35)
GFR calc Af Amer: 6 mL/min — ABNORMAL LOW (ref >90)
GFR, EST NON AFRICAN AMERICAN: 5 mL/min — AB (ref >90)
GLUCOSE: 101 mg/dL — AB (ref 70–99)
POTASSIUM: 5.5 meq/L — AB (ref 3.7–5.3)
Sodium: 140 mEq/L (ref 137–147)

## 2014-08-04 LAB — MAGNESIUM: Magnesium: 2.6 mg/dL — ABNORMAL HIGH (ref 1.5–2.5)

## 2014-08-04 LAB — APTT: APTT: 31 s (ref 24–37)

## 2014-08-04 SURGERY — INSERTION, ENDOVASCULAR STENT GRAFT, AORTA, ABDOMINAL
Anesthesia: General | Site: Groin | Laterality: Bilateral

## 2014-08-04 MED ORDER — PHENYLEPHRINE HCL 10 MG/ML IJ SOLN
10.0000 mg | INTRAVENOUS | Status: DC | PRN
  Administered 2014-08-04: 10 ug/min via INTRAVENOUS

## 2014-08-04 MED ORDER — PROMETHAZINE HCL 25 MG/ML IJ SOLN: 6.2500 mg | INTRAMUSCULAR | Status: DC | PRN

## 2014-08-04 MED ORDER — LACTATED RINGERS IV SOLN: INTRAVENOUS | Status: DC | PRN

## 2014-08-04 MED ORDER — MORPHINE SULFATE 2 MG/ML IJ SOLN: 2.0000 mg | INTRAMUSCULAR | Status: DC | PRN

## 2014-08-04 MED ORDER — SODIUM CHLORIDE 0.9 % IR SOLN
Status: DC | PRN
  Administered 2014-08-04: 07:00:00

## 2014-08-04 MED ORDER — EPHEDRINE SULFATE 50 MG/ML IJ SOLN
INTRAMUSCULAR | Status: DC | PRN
  Administered 2014-08-04: 5 mg via INTRAVENOUS

## 2014-08-04 MED ORDER — ONDANSETRON HCL 4 MG/2ML IJ SOLN
INTRAMUSCULAR | Status: AC
  Filled 2014-08-04: qty 2

## 2014-08-04 MED ORDER — TORSEMIDE 20 MG PO TABS
20.0000 mg | ORAL_TABLET | Freq: Every day | ORAL | Status: DC
  Administered 2014-08-04 – 2014-08-05 (×2): 20 mg via ORAL
  Filled 2014-08-04 (×3): qty 1

## 2014-08-04 MED ORDER — PROPOFOL 10 MG/ML IV BOLUS
INTRAVENOUS | Status: DC | PRN
  Administered 2014-08-04: 40 mg via INTRAVENOUS
  Administered 2014-08-04: 100 mg via INTRAVENOUS

## 2014-08-04 MED ORDER — AMLODIPINE BESYLATE 10 MG PO TABS
10.0000 mg | ORAL_TABLET | Freq: Every day | ORAL | Status: DC
  Administered 2014-08-05: 10 mg via ORAL
  Filled 2014-08-04: qty 1

## 2014-08-04 MED ORDER — ETOMIDATE 2 MG/ML IV SOLN
INTRAVENOUS | Status: AC
  Filled 2014-08-04: qty 10

## 2014-08-04 MED ORDER — OXYCODONE HCL 5 MG PO TABS
5.0000 mg | ORAL_TABLET | ORAL | Status: DC | PRN
  Administered 2014-08-04 – 2014-08-05 (×3): 10 mg via ORAL
  Filled 2014-08-04 (×3): qty 2

## 2014-08-04 MED ORDER — SEVELAMER CARBONATE 800 MG PO TABS
800.0000 mg | ORAL_TABLET | Freq: Every day | ORAL | Status: DC | PRN
Start: 2014-08-04 — End: 2014-08-05
  Filled 2014-08-04: qty 1

## 2014-08-04 MED ORDER — EPHEDRINE SULFATE 50 MG/ML IJ SOLN
INTRAMUSCULAR | Status: AC
  Filled 2014-08-04: qty 1

## 2014-08-04 MED ORDER — MAGNESIUM SULFATE 40 MG/ML IJ SOLN: 2.0000 g | Freq: Every day | INTRAMUSCULAR | Status: DC | PRN

## 2014-08-04 MED ORDER — IODIXANOL 320 MG/ML IV SOLN
INTRAVENOUS | Status: DC | PRN
  Administered 2014-08-04: 55 mL via INTRAVENOUS

## 2014-08-04 MED ORDER — HYDROMORPHONE HCL 1 MG/ML IJ SOLN
INTRAMUSCULAR | Status: AC
  Filled 2014-08-04: qty 1

## 2014-08-04 MED ORDER — SODIUM CHLORIDE 0.9 % IV SOLN
INTRAVENOUS | Status: DC | PRN
  Administered 2014-08-04: 07:00:00 via INTRAVENOUS

## 2014-08-04 MED ORDER — HEPARIN SODIUM (PORCINE) 1000 UNIT/ML IJ SOLN
INTRAMUSCULAR | Status: AC
  Filled 2014-08-04: qty 1

## 2014-08-04 MED ORDER — ESMOLOL HCL 10 MG/ML IV SOLN
INTRAVENOUS | Status: DC | PRN
  Administered 2014-08-04: 20 mg via INTRAVENOUS

## 2014-08-04 MED ORDER — ONDANSETRON HCL 4 MG PO TABS
4.0000 mg | ORAL_TABLET | Freq: Four times a day (QID) | ORAL | Status: DC | PRN
Start: 2014-08-04 — End: 2014-08-04

## 2014-08-04 MED ORDER — SEVELAMER CARBONATE 800 MG PO TABS: 800.0000 mg | ORAL_TABLET | Freq: Three times a day (TID) | ORAL | Status: DC

## 2014-08-04 MED ORDER — ALUM & MAG HYDROXIDE-SIMETH 200-200-20 MG/5ML PO SUSP: 15.0000 mL | ORAL | Status: DC | PRN

## 2014-08-04 MED ORDER — HEPARIN SODIUM (PORCINE) 1000 UNIT/ML IJ SOLN
INTRAMUSCULAR | Status: DC | PRN
  Administered 2014-08-04: 6000 [IU] via INTRAVENOUS

## 2014-08-04 MED ORDER — ENOXAPARIN SODIUM 30 MG/0.3ML ~~LOC~~ SOLN
30.0000 mg | SUBCUTANEOUS | Status: DC
  Administered 2014-08-05: 30 mg via SUBCUTANEOUS
  Filled 2014-08-04 (×2): qty 0.3

## 2014-08-04 MED ORDER — ACETAMINOPHEN 325 MG PO TABS: 650.0000 mg | ORAL_TABLET | Freq: Four times a day (QID) | ORAL | Status: DC | PRN

## 2014-08-04 MED ORDER — POLYETHYLENE GLYCOL 3350 17 G PO PACK
17.0000 g | PACK | Freq: Every day | ORAL | Status: DC
  Filled 2014-08-04: qty 1

## 2014-08-04 MED ORDER — CHLORHEXIDINE GLUCONATE CLOTH 2 % EX PADS
6.0000 | MEDICATED_PAD | Freq: Once | CUTANEOUS | Status: DC
Start: 2014-08-04 — End: 2014-08-04

## 2014-08-04 MED ORDER — OXYCODONE HCL 5 MG PO TABS: 5.0000 mg | ORAL_TABLET | Freq: Four times a day (QID) | ORAL | Status: DC | PRN

## 2014-08-04 MED ORDER — MIDAZOLAM HCL 2 MG/2ML IJ SOLN
INTRAMUSCULAR | Status: AC
  Filled 2014-08-04: qty 2

## 2014-08-04 MED ORDER — LIDOCAINE HCL (CARDIAC) 20 MG/ML IV SOLN
INTRAVENOUS | Status: DC | PRN
  Administered 2014-08-04: 80 mg via INTRAVENOUS

## 2014-08-04 MED ORDER — NEOSTIGMINE METHYLSULFATE 10 MG/10ML IV SOLN
INTRAVENOUS | Status: DC | PRN
  Administered 2014-08-04: 3 mg via INTRAVENOUS

## 2014-08-04 MED ORDER — PROTAMINE SULFATE 10 MG/ML IV SOLN
INTRAVENOUS | Status: DC | PRN
  Administered 2014-08-04: 45 mg via INTRAVENOUS
  Administered 2014-08-04: 5 mg via INTRAVENOUS

## 2014-08-04 MED ORDER — DEXTROSE 5 % IV SOLN: 1.5000 g | Freq: Two times a day (BID) | INTRAVENOUS | Status: DC

## 2014-08-04 MED ORDER — PROPOFOL 10 MG/ML IV BOLUS
INTRAVENOUS | Status: AC
  Filled 2014-08-04: qty 20

## 2014-08-04 MED ORDER — CARVEDILOL 6.25 MG PO TABS
6.2500 mg | ORAL_TABLET | Freq: Two times a day (BID) | ORAL | Status: DC
  Administered 2014-08-04 – 2014-08-05 (×2): 6.25 mg via ORAL
  Filled 2014-08-04 (×5): qty 1

## 2014-08-04 MED ORDER — DIPHENHYDRAMINE-ZINC ACETATE 1-0.1 % EX CREA: 1.0000 "application " | TOPICAL_CREAM | Freq: Every day | CUTANEOUS | Status: DC | PRN

## 2014-08-04 MED ORDER — OXYCODONE HCL 5 MG PO TABS: 5.0000 mg | ORAL_TABLET | Freq: Once | ORAL | Status: DC | PRN

## 2014-08-04 MED ORDER — ONDANSETRON HCL 4 MG PO TABS: 4.0000 mg | ORAL_TABLET | Freq: Four times a day (QID) | ORAL | Status: DC | PRN

## 2014-08-04 MED ORDER — GLYCOPYRROLATE 0.2 MG/ML IJ SOLN
INTRAMUSCULAR | Status: DC | PRN
  Administered 2014-08-04: .4 mg via INTRAVENOUS

## 2014-08-04 MED ORDER — PROTAMINE SULFATE 10 MG/ML IV SOLN
INTRAVENOUS | Status: AC
  Filled 2014-08-04: qty 25

## 2014-08-04 MED ORDER — ARTIFICIAL TEARS OP OINT
TOPICAL_OINTMENT | OPHTHALMIC | Status: AC
  Filled 2014-08-04: qty 3.5

## 2014-08-04 MED ORDER — MIDAZOLAM HCL 5 MG/5ML IJ SOLN
INTRAMUSCULAR | Status: DC | PRN
  Administered 2014-08-04: .5 mg via INTRAVENOUS

## 2014-08-04 MED ORDER — DIPHENHYDRAMINE-ZINC ACETATE 2-0.1 % EX CREA
TOPICAL_CREAM | Freq: Every day | CUTANEOUS | Status: DC | PRN
  Filled 2014-08-04: qty 28

## 2014-08-04 MED ORDER — METOPROLOL TARTRATE 1 MG/ML IV SOLN: 2.0000 mg | INTRAVENOUS | Status: DC | PRN

## 2014-08-04 MED ORDER — HYDROMORPHONE HCL 1 MG/ML IJ SOLN
0.2500 mg | INTRAMUSCULAR | Status: DC | PRN
  Administered 2014-08-04: 0.5 mg via INTRAVENOUS

## 2014-08-04 MED ORDER — DOCUSATE SODIUM 100 MG PO CAPS: 100.0000 mg | ORAL_CAPSULE | Freq: Every day | ORAL | Status: DC

## 2014-08-04 MED ORDER — ARTIFICIAL TEARS OP OINT
TOPICAL_OINTMENT | OPHTHALMIC | Status: DC | PRN
  Administered 2014-08-04: 1 via OPHTHALMIC

## 2014-08-04 MED ORDER — HYDRALAZINE HCL 20 MG/ML IJ SOLN
5.0000 mg | INTRAMUSCULAR | Status: DC | PRN
  Administered 2014-08-05: 5 mg via INTRAVENOUS
  Filled 2014-08-04: qty 1

## 2014-08-04 MED ORDER — LIDOCAINE HCL (CARDIAC) 20 MG/ML IV SOLN
INTRAVENOUS | Status: AC
  Filled 2014-08-04: qty 5

## 2014-08-04 MED ORDER — SODIUM POLYSTYRENE SULFONATE 15 GM/60ML PO SUSP
45.0000 g | Freq: Once | ORAL | Status: AC
  Administered 2014-08-04: 45 g via ORAL
  Filled 2014-08-04: qty 180

## 2014-08-04 MED ORDER — LABETALOL HCL 5 MG/ML IV SOLN
10.0000 mg | INTRAVENOUS | Status: AC | PRN
  Administered 2014-08-04 – 2014-08-05 (×4): 10 mg via INTRAVENOUS
  Filled 2014-08-04 (×4): qty 4

## 2014-08-04 MED ORDER — GUAIFENESIN-DM 100-10 MG/5ML PO SYRP
15.0000 mL | ORAL_SOLUTION | ORAL | Status: DC | PRN
Start: 2014-08-04 — End: 2014-08-05

## 2014-08-04 MED ORDER — POTASSIUM CHLORIDE CRYS ER 20 MEQ PO TBCR: 20.0000 meq | EXTENDED_RELEASE_TABLET | Freq: Every day | ORAL | Status: DC | PRN

## 2014-08-04 MED ORDER — SODIUM CHLORIDE 0.9 % IJ SOLN
INTRAMUSCULAR | Status: AC
  Filled 2014-08-04: qty 10

## 2014-08-04 MED ORDER — CO-ENZYME Q-10 30 MG PO CAPS: 30.0000 mg | ORAL_CAPSULE | Freq: Every day | ORAL | Status: DC

## 2014-08-04 MED ORDER — PHENOL 1.4 % MT LIQD: 1.0000 | OROMUCOSAL | Status: DC | PRN

## 2014-08-04 MED ORDER — SODIUM CHLORIDE 0.9 % IV SOLN: 500.0000 mL | Freq: Once | INTRAVENOUS | Status: AC | PRN

## 2014-08-04 MED ORDER — ROCURONIUM BROMIDE 100 MG/10ML IV SOLN
INTRAVENOUS | Status: DC | PRN
  Administered 2014-08-04: 50 mg via INTRAVENOUS

## 2014-08-04 MED ORDER — BISACODYL 10 MG RE SUPP: 10.0000 mg | Freq: Every day | RECTAL | Status: DC | PRN

## 2014-08-04 MED ORDER — CHLORHEXIDINE GLUCONATE CLOTH 2 % EX PADS: 6.0000 | MEDICATED_PAD | Freq: Once | CUTANEOUS | Status: DC

## 2014-08-04 MED ORDER — SUCCINYLCHOLINE CHLORIDE 20 MG/ML IJ SOLN
INTRAMUSCULAR | Status: AC
  Filled 2014-08-04: qty 1

## 2014-08-04 MED ORDER — 0.9 % SODIUM CHLORIDE (POUR BTL) OPTIME
TOPICAL | Status: DC | PRN
  Administered 2014-08-04: 1000 mL

## 2014-08-04 MED ORDER — ROCURONIUM BROMIDE 50 MG/5ML IV SOLN
INTRAVENOUS | Status: AC
  Filled 2014-08-04: qty 1

## 2014-08-04 MED ORDER — FENTANYL CITRATE 0.05 MG/ML IJ SOLN
INTRAMUSCULAR | Status: AC
  Filled 2014-08-04: qty 5

## 2014-08-04 MED ORDER — CLONIDINE HCL 0.2 MG PO TABS
0.2000 mg | ORAL_TABLET | Freq: Two times a day (BID) | ORAL | Status: DC
  Administered 2014-08-04 – 2014-08-05 (×2): 0.2 mg via ORAL
  Filled 2014-08-04 (×3): qty 1

## 2014-08-04 MED ORDER — SEVELAMER CARBONATE 800 MG PO TABS
1600.0000 mg | ORAL_TABLET | Freq: Three times a day (TID) | ORAL | Status: DC
  Administered 2014-08-04 – 2014-08-05 (×2): 1600 mg via ORAL
  Filled 2014-08-04 (×6): qty 2

## 2014-08-04 MED ORDER — GLYCOPYRROLATE 0.2 MG/ML IJ SOLN
INTRAMUSCULAR | Status: AC
  Filled 2014-08-04: qty 3

## 2014-08-04 MED ORDER — ONDANSETRON HCL 4 MG/2ML IJ SOLN: 4.0000 mg | Freq: Four times a day (QID) | INTRAMUSCULAR | Status: DC | PRN

## 2014-08-04 MED ORDER — OXYCODONE HCL 5 MG/5ML PO SOLN: 5.0000 mg | Freq: Once | ORAL | Status: DC | PRN

## 2014-08-04 MED ORDER — SODIUM CHLORIDE 0.9 % IV SOLN: INTRAVENOUS | Status: DC

## 2014-08-04 MED ORDER — PANTOPRAZOLE SODIUM 40 MG PO TBEC
40.0000 mg | DELAYED_RELEASE_TABLET | Freq: Every day | ORAL | Status: DC
  Administered 2014-08-04: 40 mg via ORAL
  Filled 2014-08-04: qty 1

## 2014-08-04 MED ORDER — ONDANSETRON HCL 4 MG/2ML IJ SOLN
4.0000 mg | Freq: Four times a day (QID) | INTRAMUSCULAR | Status: DC | PRN
  Administered 2014-08-04: 4 mg via INTRAVENOUS
  Filled 2014-08-04 (×2): qty 2

## 2014-08-04 MED ORDER — FENTANYL CITRATE 0.05 MG/ML IJ SOLN
INTRAMUSCULAR | Status: DC | PRN
  Administered 2014-08-04 (×2): 50 ug via INTRAVENOUS

## 2014-08-04 SURGICAL SUPPLY — 78 items
APL SKNCLS STERI-STRIP NONHPOA (GAUZE/BANDAGES/DRESSINGS) ×1
BAG BANDED W/RUBBER/TAPE 36X54 (MISCELLANEOUS) ×3 IMPLANT
BAG EQP BAND 135X91 W/RBR TAPE (MISCELLANEOUS) ×1
BAG SNAP BAND KOVER 36X36 (MISCELLANEOUS) ×3 IMPLANT
BENZOIN TINCTURE PRP APPL 2/3 (GAUZE/BANDAGES/DRESSINGS) ×3 IMPLANT
CANISTER SUCTION 2500CC (MISCELLANEOUS) ×3 IMPLANT
CATH BEACON 5.038 65CM KMP-01 (CATHETERS) ×4 IMPLANT
CATH OMNI FLUSH .035X70CM (CATHETERS) ×2 IMPLANT
CLIP LIGATING EXTRA MED SLVR (CLIP) IMPLANT
CLIP LIGATING EXTRA SM BLUE (MISCELLANEOUS) IMPLANT
CLOSURE STERI-STRIP 1/2X4 (GAUZE/BANDAGES/DRESSINGS) ×1
CLOSURE WOUND 1/2 X4 (GAUZE/BANDAGES/DRESSINGS) ×1
CLSR STERI-STRIP ANTIMIC 1/2X4 (GAUZE/BANDAGES/DRESSINGS) ×1 IMPLANT
COVER DOME SNAP 22 D (MISCELLANEOUS) ×3 IMPLANT
COVER MAYO STAND STRL (DRAPES) ×3 IMPLANT
COVER PROBE W GEL 5X96 (DRAPES) ×3 IMPLANT
COVER SURGICAL LIGHT HANDLE (MISCELLANEOUS) ×3 IMPLANT
DEVICE CLOSURE PERCLS PRGLD 6F (VASCULAR PRODUCTS) IMPLANT
DRAPE TABLE COVER HEAVY DUTY (DRAPES) ×3 IMPLANT
DRSG TEGADERM 2-3/8X2-3/4 SM (GAUZE/BANDAGES/DRESSINGS) ×4 IMPLANT
DRYSEAL FLEXSHEATH 12FR 33CM (SHEATH) ×2
DRYSEAL FLEXSHEATH 18FR 33CM (SHEATH) ×2
ELECT CAUTERY BLADE 6.4 (BLADE) IMPLANT
ELECT REM PT RETURN 9FT ADLT (ELECTROSURGICAL) ×6
ELECTRODE REM PT RTRN 9FT ADLT (ELECTROSURGICAL) ×2 IMPLANT
EXCLUDER TNK 28X14.5MMX12CM (Endovascular Graft) IMPLANT
EXCLUDER TRUNK 28X14.5MMX12CM (Endovascular Graft) ×3 IMPLANT
GAUZE SPONGE 2X2 8PLY STRL LF (GAUZE/BANDAGES/DRESSINGS) ×1 IMPLANT
GLOVE BIO SURGEON STRL SZ 6.5 (GLOVE) ×1 IMPLANT
GLOVE BIO SURGEONS STRL SZ 6.5 (GLOVE) ×1
GLOVE BIOGEL PI IND STRL 6.5 (GLOVE) IMPLANT
GLOVE BIOGEL PI INDICATOR 6.5 (GLOVE) ×2
GLOVE ECLIPSE 6.5 STRL STRAW (GLOVE) ×2 IMPLANT
GLOVE SS BIOGEL STRL SZ 7.5 (GLOVE) ×1 IMPLANT
GLOVE SUPERSENSE BIOGEL SZ 7.5 (GLOVE) ×2
GOWN STRL REUS W/ TWL LRG LVL3 (GOWN DISPOSABLE) ×3 IMPLANT
GOWN STRL REUS W/TWL LRG LVL3 (GOWN DISPOSABLE) ×9
GRAFT BALLN CATH 65CM (STENTS) IMPLANT
KIT BASIN OR (CUSTOM PROCEDURE TRAY) ×3 IMPLANT
KIT ROOM TURNOVER OR (KITS) ×3 IMPLANT
LEG CONTRALATERAL 16X18X13.5 (Endovascular Graft) ×2 IMPLANT
LEG CONTRALATERAL 18X11.5 (Endovascular Graft) ×2 IMPLANT
NDL PERC 18GX7CM (NEEDLE) ×1 IMPLANT
NEEDLE PERC 18GX7CM (NEEDLE) ×3 IMPLANT
NS IRRIG 1000ML POUR BTL (IV SOLUTION) ×3 IMPLANT
PACK AORTA (CUSTOM PROCEDURE TRAY) ×3 IMPLANT
PAD ARMBOARD 7.5X6 YLW CONV (MISCELLANEOUS) ×6 IMPLANT
PENCIL BUTTON HOLSTER BLD 10FT (ELECTRODE) IMPLANT
PERCLOSE PROGLIDE 6F (VASCULAR PRODUCTS) ×15
PROTECTION STATION PRESSURIZED (MISCELLANEOUS) ×3
SHEATH AVANTI 11CM 8FR (MISCELLANEOUS) ×2 IMPLANT
SHEATH BRITE TIP 8FR 23CM (MISCELLANEOUS) ×2 IMPLANT
SHEATH DRYSEAL FLEX 12FR 33CM (SHEATH) IMPLANT
SHEATH DRYSEAL FLEX 18FR 33CM (SHEATH) IMPLANT
SPONGE GAUZE 2X2 STER 10/PKG (GAUZE/BANDAGES/DRESSINGS) ×2
STAPLER VISISTAT 35W (STAPLE) IMPLANT
STATION PROTECTION PRESSURIZED (MISCELLANEOUS) ×1 IMPLANT
STENT GRAFT BALLN CATH 65CM (STENTS) ×2
STOPCOCK MORSE 400PSI 3WAY (MISCELLANEOUS) ×3 IMPLANT
STRIP CLOSURE SKIN 1/2X4 (GAUZE/BANDAGES/DRESSINGS) ×2 IMPLANT
SUT ETHILON 3 0 PS 1 (SUTURE) IMPLANT
SUT PROLENE 5 0 C 1 24 (SUTURE) IMPLANT
SUT VIC AB 2-0 CTX 36 (SUTURE) IMPLANT
SUT VIC AB 3-0 SH 18 (SUTURE) IMPLANT
SUT VIC AB 3-0 SH 27 (SUTURE) ×3
SUT VIC AB 3-0 SH 27X BRD (SUTURE) IMPLANT
SUT VICRYL 4-0 PS2 18IN ABS (SUTURE) ×6 IMPLANT
SYR 20CC LL (SYRINGE) ×6 IMPLANT
SYR 30ML LL (SYRINGE) IMPLANT
SYR 5ML LL (SYRINGE) ×3 IMPLANT
SYR MEDRAD MARK V 150ML (SYRINGE) ×3 IMPLANT
SYRINGE 10CC LL (SYRINGE) ×9 IMPLANT
TOWEL OR 17X24 6PK STRL BLUE (TOWEL DISPOSABLE) ×6 IMPLANT
TOWEL OR 17X26 10 PK STRL BLUE (TOWEL DISPOSABLE) ×6 IMPLANT
TRAY FOLEY CATH 16FRSI W/METER (SET/KITS/TRAYS/PACK) ×3 IMPLANT
TUBING HIGH PRESSURE 120CM (CONNECTOR) ×3 IMPLANT
WIRE AMPLATZ SS-J .035X180CM (WIRE) ×4 IMPLANT
WIRE BENTSON .035X145CM (WIRE) ×4 IMPLANT

## 2014-08-04 NOTE — Progress Notes (Signed)
Utilization Review Completed.Naquan Garman T10/26/2015  

## 2014-08-04 NOTE — Progress Notes (Addendum)
  Day of Surgery Note    Subjective:  No complaints  Filed Vitals:   08/04/14 1230  BP:   Pulse: 51  Temp: 98.1 F (36.7 C)  Resp: 13    Incisions:   Bilateral groins are soft without hematoma.  Dressings are dry. Extremities:  + palpable DP bilaterally Cardiac:  regular Lungs:  Non labored Abdomen:  Soft, NT/ND   Assessment/Plan:  This is a 73 y.o. male who is s/p EVAR  -pt doing well this afternoon.  -there is about a 30 point difference in A-line and cuff pressures.  Will go by cuff pressure. -advance diet as tolerated to renal diet -anticipate discharge home tomorrow.    Leontine Locket, PA-C 08/04/2014 1:27 PM  I have examined the patient, reviewed and agree with above.Looks great. Plan dc in AM  EARLY, [name], MD 08/04/2014 2:07 PM

## 2014-08-04 NOTE — Anesthesia Postprocedure Evaluation (Signed)
  Anesthesia Post-op Note  Patient: Alan Hawkins  Procedure(s) Performed: Procedure(s): ABDOMINAL AORTIC ENDOVASCULAR STENT GRAFT (Bilateral)  Patient Location: PACU  Anesthesia Type:General  Level of Consciousness: awake and alert   Airway and Oxygen Therapy: Patient Spontanous Breathing  Post-op Pain: mild  Post-op Assessment: Post-op Vital signs reviewed  Post-op Vital Signs: stable  Last Vitals:  Filed Vitals:   08/04/14 1045  BP:   Pulse: 51  Temp:   Resp: 12    Complications: No apparent anesthesia complications

## 2014-08-04 NOTE — Progress Notes (Signed)
PHARMACIST - PHYSICIAN ORDER COMMUNICATION  CONCERNING: P&T Medication Policy on Herbal Medications  DESCRIPTION:  This patient's order for:  Co-enzyme Q-10  has been noted.  This product(s) is classified as an "herbal" or natural product. Due to a lack of definitive safety studies or FDA approval, nonstandard manufacturing practices, plus the potential risk of unknown drug-drug interactions while on inpatient medications, the Pharmacy and Therapeutics Committee does not permit the use of "herbal" or natural products of this type within [facility].   ACTION TAKEN: The pharmacy department is unable to verify this order at this time and your patient has been informed of this safety policy. Please reevaluate patient's clinical condition at discharge and address if the herbal or natural product(s) should be resumed at that time.  Since the patient has ESRD and is not going to HD until 10/27-  He will not need any more additional post-op Zinacef at this time. Will d/c standing orders.   Alycia Rossetti, PharmD, BCPS Clinical Pharmacist Pager: [phone] 08/04/2014 2:06 PM

## 2014-08-04 NOTE — Transfer of Care (Signed)
Immediate Anesthesia Transfer of Care Note  Patient: Alan Hawkins  Procedure(s) Performed: Procedure(s): ABDOMINAL AORTIC ENDOVASCULAR STENT GRAFT (Bilateral)  Patient Location: PACU  Anesthesia Type:General  Level of Consciousness: awake, alert  and oriented  Airway & Oxygen Therapy: Patient Spontanous Breathing and Patient connected to nasal cannula oxygen  Post-op Assessment: Report given to PACU RN and Post -op Vital signs reviewed and stable  Post vital signs: Reviewed and stable  Complications: No apparent anesthesia complications

## 2014-08-04 NOTE — Consult Note (Signed)
Reason for Consult:ESRD Referring Physician: Dr. Elpidio Hawkins is an 73 y.o. male.  HPI: 73 yr old male on HD at [place] since 1/15.  Dialyzes via LUA AVF.  No prob with dialysis. Underwent [place] eval @ [facility] and found infrarenal 5.9 cm AAA.  Had aortic stent graft placed by Dr. Donnetta Hutching today. Usual HD on MWF. Plan is to release in am.  Dialyzes at [facility] on MWF since 1/15. Primary Nephrologist Dr. Hinda Lenis. . Constitutional: negative Eyes: better after catarracts sone Ears, nose, mouth, throat, and face: negative Respiratory: negative Cardiovascular: negative Gastrointestinal: negative Genitourinary:does make some urine Integument/breast: negative Musculoskeletal:negative Allergic/Immunologic: Bee Sting, NTG, Nitrofurantoin, Tape   Access LUA AVF.  Past Medical History  Diagnosis Date  . Essential hypertension, benign   . Hyperlipidemia   . GERD (gastroesophageal reflux disease)   . CHF (congestive heart failure)   . AAA (abdominal aortic aneurysm)   . Cancer 05-2014    basal cell carcinoma right ear  . Heart murmur   . CKD (chronic kidney disease) stage 3, GFR 30-59 ml/min     hd [facility] - started jan 2015  . Arthritis   . Anemia     iron infusions with dialysis occasionally    Past Surgical History  Procedure Laterality Date  . Cataract extraction w/phaco  11/17/2011    Procedure: CATARACT EXTRACTION PHACO AND INTRAOCULAR LENS PLACEMENT (IOC);  Surgeon: Tonny Branch, MD;  Location: [facility];  Service: Ophthalmology;  Laterality: Right;  CDE=16.17  . Eye surgery      R KPE w/ IOL  . Cataract extraction w/phaco  12/05/2011    Procedure: CATARACT EXTRACTION PHACO AND INTRAOCULAR LENS PLACEMENT (IOC);  Surgeon: Tonny Branch, MD;  Location: [facility];  Service: Ophthalmology;  Laterality: Left;  CDE 16.22  . Vasectomy    . Insertion of dialysis catheter Right 10/15/2013    removed  . Colonoscopy N/A 05/20/2014    Procedure: COLONOSCOPY;  Surgeon: Rogene Houston,  MD;  Location: [facility];  Service: Endoscopy;  Laterality: N/A;  730  . Excision of basal cell carcinoma right ear Right 05-2014  . Av fistula placement Left     Family History  Problem Relation Age of Onset  . Heart disease Mother   . Cancer Mother   . Heart disease Sister   . Heart attack Sister   . Stroke Father   . Hypertension Son     Social History:  reports that he has quit smoking. His smoking use included Cigarettes. He smoked 0.00 packs per day. He has never used smokeless tobacco. He reports that he does not drink alcohol or use illicit drugs.  Allergies:  Allergies  Allergen Reactions  . Bee Venom Anaphylaxis  . Nitrofuran Derivatives Nausea And Vomiting    Nausea and vomiting , sever headache while on nitroglycerin infusion twice  . Nitroglycerin Other (See Comments)    Headache   nausea  . Tape Itching and Rash    Medications:  I have reviewed the patient's current medications. Prior to Admission:  Prescriptions prior to admission  Medication Sig Dispense Refill  . acetaminophen (TYLENOL) 500 MG tablet Take 500-1,000 mg by mouth every 6 (six) hours as needed for moderate pain or headache.      Marland Kitchen amLODipine (NORVASC) 10 MG tablet Take 10 mg by mouth daily.       . carvedilol (COREG) 6.25 MG tablet Take 6.25 mg by mouth 2 (two) times daily with a  meal.       . cloNIDine (CATAPRES) 0.2 MG tablet Take 0.2 mg by mouth 2 (two) times daily.       Marland Kitchen Co-Enzyme Q-10 30 MG CAPS Take 30 mg by mouth daily.       . diphenhydrAMINE-zinc acetate (BENADRYL) cream Apply 1 application topically daily as needed for itching (after Dyalisis due to itching on his arms, may be caused from tape).       . ondansetron (ZOFRAN) 4 MG tablet Take 4 mg by mouth every 6 (six) hours as needed for nausea or vomiting.       Vladimir Faster Glycol-Propyl Glycol (SYSTANE OP) Apply 1 drop to eye daily as needed (dry eyes).       . polyethylene glycol (MIRALAX) packet Take 17 g by mouth daily. Take  17 g by mouth daily as needed (Constipation).      . sevelamer carbonate (RENVELA) 800 MG tablet Take 800-1,600 mg by mouth 3 (three) times daily with meals. 2 tabs with meals and 1 tab with snack      . torsemide (DEMADEX) 20 MG tablet Take 20 mg by mouth daily.         Results for orders placed during the hospital encounter of 08/04/14 (from the past 48 hour(s))  CBC     Status: Abnormal   Collection Time    08/04/14 11:00 AM      Result Value Ref Range   WBC 5.6  4.0 - 10.5 K/uL   RBC 3.17 (*) 4.22 - 5.81 MIL/uL   Hemoglobin 9.8 (*) 13.0 - 17.0 g/dL   HCT 29.5 (*) 39.0 - 52.0 %   MCV 93.1  78.0 - 100.0 fL   MCH 30.9  26.0 - 34.0 pg   MCHC 33.2  30.0 - 36.0 g/dL   RDW 12.6  11.5 - 15.5 %   Platelets 170  150 - 400 K/uL  BASIC METABOLIC PANEL     Status: Abnormal   Collection Time    08/04/14 11:00 AM      Result Value Ref Range   Sodium 140  137 - 147 mEq/L   Potassium 5.5 (*) 3.7 - 5.3 mEq/L   Chloride 105  96 - 112 mEq/L   CO2 19  19 - 32 mEq/L   Glucose, Bld 101 (*) 70 - 99 mg/dL   BUN 54 (*) 6 - 23 mg/dL   Creatinine, Ser 9.32 (*) 0.50 - 1.35 mg/dL   Calcium 8.2 (*) 8.4 - 10.5 mg/dL   GFR calc non Af Amer 5 (*) >90 mL/min   GFR calc Af Amer 6 (*) >90 mL/min   Comment: (NOTE)     The eGFR has been calculated using the CKD EPI equation.     This calculation has not been validated in all clinical situations.     eGFR's persistently <90 mL/min signify possible Chronic Kidney     Disease.   Anion gap 16 (*) 5 - 15  PROTIME-INR     Status: None   Collection Time    08/04/14 11:00 AM      Result Value Ref Range   Prothrombin Time 14.8  11.6 - 15.2 seconds   INR 1.15  0.00 - 1.49  APTT     Status: None   Collection Time    08/04/14 11:00 AM      Result Value Ref Range   aPTT 31  24 - 37 seconds  MAGNESIUM     Status: Abnormal  Collection Time    08/04/14 11:00 AM      Result Value Ref Range   Magnesium 2.6 (*) 1.5 - 2.5 mg/dL    Dg Chest 2 View  08/04/2014    CLINICAL DATA:  Preop AAA.  EXAM: CHEST  2 VIEW  COMPARISON:  10/15/2013.  FINDINGS: Trachea is midline. Heart is mildly enlarged, stable. Lungs are somewhat hyperinflated with scattered linear scarring at the lung bases. No airspace consolidation or pleural fluid. Mild degenerative changes are seen in the spine.  IMPRESSION: Mild hyperinflation without acute finding.   Electronically Signed   By: Lorin Picket M.D.   On: 08/04/2014 08:04   Dg Chest Port 1 View  08/04/2014   CLINICAL DATA:  Status post repair of abdominal aortic aneurysm with some shortness of breath, initial evaluation  EXAM: PORTABLE CHEST - 1 VIEW  COMPARISON:  08/04/2014  FINDINGS: Moderate cardiac enlargement. Vascular pattern normal. Minimal lower lobe atelectasis. Right internal jugular central line tip projects over superior vena cava. No pneumothorax appreciated.  IMPRESSION: Central line identified.  No acute findings.   Electronically Signed   By: Skipper Cliche M.D.   On: 08/04/2014 09:58    ROS Blood pressure 154/64, pulse 51, temperature 97.8 F (36.6 C), temperature source Oral, resp. rate 13, height _0  (1.778 m), weight 78.926 kg (174 lb), SpO2 100.00%. Physical Exam Physical Examination: General appearance - alert, well appearing, and in no distress and oriented to person, place, and time Mental status - alert, oriented to person, place, and time Eyes - pupils equal and reactive, extraocular eye movements intact Mouth - mucous membranes moist, pharynx normal without lesions Neck - adenopathy noted PCL Lymphatics - posterior cervical nodes Chest - clear to auscultation, no wheezes, rales or rhonchi, symmetric air entry Heart - normal rate and regular rhythm, S4 present, systolic murmur BS4/9 at apex Abdomen - soft, nontender, nondistended, no masses or organomegaly Mid abdm bruit Extremities - AVF, warm Skin - normal coloration and turgor, no rashes, no suspicious skin lesions noted  Assessment/Plan: 1  AAA s/p stent graft 2 ESRD: will check chem . [place] can do at 11 am tomorrow.  Will shoot for that 3 Hypertension: follow and use med 4. Anemia of ESRD: follow 5. Metabolic Bone Disease:  Cont meds 6 PVD P check chem, D/C to Unit HD if poss  Faria Casella L 08/04/2014, 12:45 PM

## 2014-08-04 NOTE — Anesthesia Procedure Notes (Signed)
Procedure Name: Intubation Date/Time: 08/04/2014 7:56 AM Performed by: Garner Nash Pre-anesthesia Checklist: Patient identified, Emergency Drugs available, Suction available, Patient being monitored and Timeout performed Patient Re-evaluated:Patient Re-evaluated prior to inductionOxygen Delivery Method: Circle system utilized Preoxygenation: Pre-oxygenation with 100% oxygen Intubation Type: IV induction Ventilation: Mask ventilation without difficulty Laryngoscope Size: Mac and 4 Grade View: Grade I Tube type: Oral Number of attempts: 1 Airway Equipment and Method: Stylet Placement Confirmation: ETT inserted through vocal cords under direct vision,  positive ETCO2,  CO2 detector and breath sounds checked- equal and bilateral Secured at: 21 cm Tube secured with: Tape Dental Injury: Teeth and Oropharynx as per pre-operative assessment

## 2014-08-04 NOTE — H&P (View-Only) (Signed)
Patient name: Alan Hawkins MRN: [id number] DOB: 05-31-1941 Sex: male   Referred by: Everette Rank  Reason for referral:  Chief Complaint  Patient presents with  . New Evaluation    dialysis on M/W/F  referred from Dr. Everette Rank    . AAA    HISTORY OF PRESENT ILLNESS: Patient is a very pleasant 73 year old gentleman seen today for evaluation of abdominal aortic aneurysm. He is new to end-stage renal disease and had hemodialysis catheter placed by Dr. Bridgett Larsson on 10/15/2013 he subsequently underwent left upper arm AV fistula creation in [facility] and has had excellent restoration and excellent Ndea Kilroy function of his left upper arm fistula. He was being evaluated for potential transplant. He had an ultrasound of his aorta his initial evaluation of this and this revealed a infrarenal abdominal aortic aneurysm. He successfully underwent CT scan of his abdomen and pelvis on 07/03/2014 and had this for review as well. This did show a 5.9 cm infrarenal aortic aneurysm. There is no evidence of iliac artery aneurysms. The patient had no prior knowledge of his aneurysm. He does report a neighbor died from a ruptured aneurysm he is familiar with the disease process. He does have hypertension and hyperlipidemia. Does have a history of prior myocardial infarction and congestive heart failure. He is quite active well compensated.  Past Medical History  Diagnosis Date  . Essential hypertension, benign   . Hyperlipidemia   . GERD (gastroesophageal reflux disease)   . CKD (chronic kidney disease) stage 3, GFR 30-59 ml/min     Creatinine reportedly 2.0 six months ago  . CHF (congestive heart failure)   . Myocardial infarction   . AAA (abdominal aortic aneurysm)   . Anemia   . Cancer 05-2014    basal cell carcinoma right ear    Past Surgical History  Procedure Laterality Date  . Cataract extraction w/phaco  11/17/2011    Procedure: CATARACT EXTRACTION PHACO AND INTRAOCULAR LENS PLACEMENT  (IOC);  Surgeon: Tonny Branch, MD;  Location: [facility];  Service: Ophthalmology;  Laterality: Right;  CDE=16.17  . Eye surgery      R KPE w/ IOL  . Cataract extraction w/phaco  12/05/2011    Procedure: CATARACT EXTRACTION PHACO AND INTRAOCULAR LENS PLACEMENT (IOC);  Surgeon: Tonny Branch, MD;  Location: [facility];  Service: Ophthalmology;  Laterality: Left;  CDE 16.22  . Vasectomy    . Insertion of dialysis catheter Right 10/15/2013    Procedure: INSERTION OF DIALYSIS CATHETER;  Surgeon: Conrad [place], MD;  Location: [place];  Service: Vascular;  Laterality: Right;  . Colonoscopy N/A 05/20/2014    Procedure: COLONOSCOPY;  Surgeon: Rogene Houston, MD;  [facility];  Service: Endoscopy;  Laterality: N/A;  730  . Excision of basal cell carcinoma right ear Right 05-2014    History   Social History  . Marital Status: Married    Spouse Name: N/A    Number of Children: N/A  . Years of Education: N/A   Occupational History  . Not on file.   Social History Main Topics  . Smoking status: Former Smoker    Types: Cigarettes  . Smokeless tobacco: Never Used     Comment: "Stopped smoking at least 35-40 years ago"  . Alcohol Use: Yes     Comment: Occasional monthly beer  . Drug Use: No  . Sexual Activity: Not on file   Other Topics Concern  . Not on file   Social History Narrative  .  No narrative on file    Family History  Problem Relation Age of Onset  . Heart disease Mother   . Cancer Mother   . Heart disease Sister   . Heart attack Sister   . Stroke Father   . Hypertension Son     Allergies as of 07/15/2014 - Review Complete 07/15/2014  Allergen Reaction Noted  . Bee venom Anaphylaxis 09/20/2013  . Nitrofuran derivatives Nausea And Vomiting 09/22/2013  . Nitroglycerin Other (See Comments) 11/28/2013    Current Outpatient Prescriptions on File Prior to Visit  Medication Sig Dispense Refill  . amLODipine (NORVASC) 10 MG tablet Take 10 mg by mouth daily.       Marland Kitchen aspirin EC  81 MG tablet Take 81 mg by mouth daily. Take by mouth daily.      . carvedilol (COREG) 6.25 MG tablet Take 6.25 mg by mouth 2 (two) times daily with a meal.       . cloNIDine (CATAPRES) 0.2 MG tablet Take 0.2 mg by mouth 2 (two) times daily.       Marland Kitchen Co-Enzyme Q-10 30 MG CAPS Take 30 mg by mouth daily.       . ondansetron (ZOFRAN) 4 MG tablet Take 4 mg by mouth every 6 (six) hours as needed for nausea or vomiting.       Marland Kitchen oxyCODONE (ROXICODONE) 5 MG immediate release tablet Take 1 tablet (5 mg total) by mouth every 4 (four) hours as needed for severe pain.  15 tablet  0  . polyethylene glycol (MIRALAX) packet Take 17 g by mouth daily as needed. Take 17 g by mouth daily as needed (Constipation).      . sevelamer carbonate (RENVELA) 800 MG tablet Take 800-1,600 mg by mouth 3 (three) times daily with meals. 2 tabs with meals and 1 tab with snack      . torsemide (DEMADEX) 20 MG tablet Take 20 mg by mouth daily.        Current Facility-Administered Medications on File Prior to Visit  Medication Dose Route Frequency Provider Last Rate Last Dose  . fentaNYL (SUBLIMAZE) injection 25-50 mcg  25-50 mcg Intravenous Q5 min PRN Lerry Liner, MD         REVIEW OF SYSTEMS:  Positives indicated with an "X"  CARDIOVASCULAR:  [ ]  chest pain   [ ]  chest pressure   [ ]  palpitations   [ ]  orthopnea   [ ]  dyspnea on exertion   [ ]  claudication   [ ]  rest pain   [ ]  DVT   [ ]  phlebitis PULMONARY:   [ ]  productive cough   [ ]  asthma   [ ]  wheezing NEUROLOGIC:   [ ]  weakness  [ ]  paresthesias  [ ]  aphasia  [ ]  amaurosis  [ ]  dizziness HEMATOLOGIC:   [ ]  bleeding problems   [ ]  clotting disorders MUSCULOSKELETAL:  [ ]  joint pain   [ ]  joint swelling GASTROINTESTINAL: [ ]   blood in stool  [ ]   hematemesis GENITOURINARY:  [ ]   dysuria  [ ]   hematuria PSYCHIATRIC:  [ ]  history of major depression INTEGUMENTARY:  [ ]  rashes  [ ]  ulcers CONSTITUTIONAL:  [ ]  fever   [ ]  chills  PHYSICAL EXAMINATION:  General: The  patient is a well-nourished male, in no acute distress. Vital signs are BP 115/67  Pulse 58  Resp 18  Ht 5\' 11"  (1.803 m)  Wt 175 lb 9.6 oz (79.652 kg)  BMI  24.50 kg/m2 Pulmonary: There is a good air exchange bilaterally without wheezing or rales. Abdomen: Soft and non-tender with normal pitch bowel sounds. Easily palpable aneurysm which is nontender to him. Musculoskeletal: There are no major deformities.  There is no significant extremity pain. Neurologic: No focal weakness or paresthesias are detected, Skin: There are no ulcer or rashes noted. Psychiatric: The patient has normal affect. Cardiovascular: There is a regular rate and rhythm without significant murmur appreciated. Pulse status: 2+ radial 2+ femoral 2+ popliteal pulses and 1-2+ posterior tibial pulses with no evidence of peripheral aneurysm Well-developed left upper arm cephalic vein fistula  I reviewed his CT films and discuss this with the patient. He does have appropriate anatomy for stent graft repair   Impression and Plan:  5.9 cm infrarenal abdominal aortic aneurysm. Have discussed the significance of this with the patient and his wife. I have recommended elective repair. I did discuss Mr. Krishnamoorthy with the transplant surgeon, Dr.Farney at [facility]. We are in agreement to proceed with repair of his potentially life-threatening aneurysm and then reevaluate possibility of transplant. The stent graft should not preclude him from a transplant if appropriate. We will size the stent graft and discuss elective repair with the patient following this    Jeaninne Lodico [facility] Office: [phone]

## 2014-08-04 NOTE — Op Note (Signed)
OPERATIVE REPORT  DATE OF SURGERY: 08/04/2014  PATIENT: Alan Hawkins, 73 y.o. male MRN: [id number]  DOB: 1940-11-10  PRE-OPERATIVE DIAGNOSIS: Abdominal aortic aneurysm  POST-OPERATIVE DIAGNOSIS:  Same  PROCEDURE: Simeon Craft excluder stent graft repair of abdominal aortic aneurysm  SURGEON:  Curt Jews, M.D.  PHYSICIAN ASSISTANT: Trinh  ANESTHESIA:  Gen.  EBL: Minimal ml  Total I/O In: 500 [I.V.:500] Out: -   BLOOD ADMINISTERED: None  DRAINS: None  SPECIMEN: None  COUNTS CORRECT:  YES  PLAN OF CARE: PACU   PATIENT DISPOSITION:  PACU - hemodynamically stable  PROCEDURE DETAILS: The patient was taken to the operating placed supine position where the area of the abdomen and both groins were prepped and draped in the usual sterile fashion. Using a 2 Perclose per groins technique the Perclose sutures were placed after first obtaining access to the common femoral arteries bilaterally with SonoSite visualization. The Perclose were deployed over the guidewire and were not secured. 8 French sheaths were placed over each guidewire. A marker pigtail catheter was positioned through the left groin at the suprarenal aorta. On the right Bentson wire was exchanged for an Amplatz superstiff wire and the 18 French sheath was passed over the right groin into the aneurysmal sac. The main body was positioned in the 18 [place] sheath. This was an 18 x 14 x 12 cm. This was lysed and the crossed limb position. This was positioned at the level of the L1-L2 junction. Contrast injection showed the level of the right and left renal arteries. The right renal was slightly lower than the left renal artery. The main body of the device was deployed and repeat injection showed the stent graft was approximately 2 mm up onto the origin of the right renal artery. For this reason it was constrained and will pull down 2 cm in redeployed. Next the pigtail catheter and guidewire were pulled down into the aneurysmal sac. A  Kumpe catheter was used to cannulate the contralateral limb. This was confirmed by replacing the pigtail catheter over this wire and poorly get in the main body of the device. A retrograde hand injection to the left sheath was used to determine the level of the hypogastric artery takeoff on the left. This confirmed a 18 x 13.5 cm device was the appropriate device for this area. The 8 French sheath on the left was exchanged for a 12 [place] sheath. The contralateral limb was positioned to lie just above the hypogastric artery takeoff and this was fully deployed. Finally the right iliac extender was then brought onto the field. Again the retrograde injection to the right sheath revealed the level of the hypogastric artery takeoff and the experience extender limb which was an 18 x 11.5 cm was deployed just above the takeoff of the hypogastric artery. The attachment sites in the aorta and both iliacs and all junctions were gently dilated with a 250 balloon. The pigtail catheter was again positioned at the level of the suprarenal aorta and a completion film showed excellent positioning with no evidence of type I endoleak. There was a type II flow on the left lateral wall which was either lumbar vessel or potentially a small accessory left renal artery. The patient had been given excelled in intravenous heparin prior to placement of the 18 French sheath. The sheaths were withdrawn and the Perclose devices were secured giving excellent hemostasis. The wires were removed and the patient was given 50 mg of protamine to reverse. The puncture sites were  closed with 4-0 subcuticular Vicryl suture. The patient was transferred to the recovery room in stable condition   Curt Jews, M.D. 08/04/2014 9:48 AM

## 2014-08-04 NOTE — Interval H&P Note (Signed)
History and Physical Interval Note:  08/04/2014 6:59 AM  Alan Hawkins  has presented today for surgery, with the diagnosis of Aortic aneurysm, abdominal   The various methods of treatment have been discussed with the patient and family. After consideration of risks, benefits and other options for treatment, the patient has consented to  Procedure(s): ABDOMINAL AORTIC ENDOVASCULAR STENT GRAFT (N/A) as a surgical intervention .  The patient's history has been reviewed, patient examined, no change in status, stable for surgery.  I have reviewed the patient's chart and labs.  Questions were answered to the patient's satisfaction.     EARLY, [name]

## 2014-08-04 NOTE — Anesthesia Preprocedure Evaluation (Signed)
Anesthesia Evaluation  Patient identified by MRN, date of birth, ID band Patient awake, Patient confused and Patient unresponsive    History of Anesthesia Complications Negative for: history of anesthetic complications  Airway Mallampati: I      Dental   Pulmonary former smoker,  breath sounds clear to auscultation        Cardiovascular hypertension, +CHF Rate:Normal     Neuro/Psych    GI/Hepatic GERD-  ,  Endo/Other    Renal/GU ESRF and DialysisRenal disease     Musculoskeletal   Abdominal   Peds  Hematology   Anesthesia Other Findings   Reproductive/Obstetrics                           Anesthesia Physical Anesthesia Plan  ASA: III  Anesthesia Plan: General   Post-op Pain Management:    Induction: Intravenous  Airway Management Planned: Oral ETT  Additional Equipment:   Intra-op Plan:   Post-operative Plan:   Informed Consent: I have reviewed the patients History and Physical, chart, labs and discussed the procedure including the risks, benefits and alternatives for the proposed anesthesia with the patient or authorized representative who has indicated his/her understanding and acceptance.   Dental advisory given  Plan Discussed with:   Anesthesia Plan Comments:         Anesthesia Quick Evaluation

## 2014-08-05 ENCOUNTER — Encounter (HOSPITAL_COMMUNITY): Payer: Self-pay | Admitting: Vascular Surgery

## 2014-08-05 LAB — BASIC METABOLIC PANEL
ANION GAP: 17 — AB (ref 5–15)
BUN: 60 mg/dL — ABNORMAL HIGH (ref 6–23)
CALCIUM: 8.8 mg/dL (ref 8.4–10.5)
CO2: 19 mEq/L (ref 19–32)
Chloride: 102 mEq/L (ref 96–112)
Creatinine, Ser: 9.72 mg/dL — ABNORMAL HIGH (ref 0.50–1.35)
GFR calc Af Amer: 5 mL/min — ABNORMAL LOW (ref >90)
GFR calc non Af Amer: 5 mL/min — ABNORMAL LOW (ref >90)
Glucose, Bld: 126 mg/dL — ABNORMAL HIGH (ref 70–99)
Potassium: 4.7 mEq/L (ref 3.7–5.3)
SODIUM: 138 meq/L (ref 137–147)

## 2014-08-05 LAB — CBC
HCT: 31.9 % — ABNORMAL LOW (ref 39.0–52.0)
Hemoglobin: 10.7 g/dL — ABNORMAL LOW (ref 13.0–17.0)
MCH: 30.7 pg (ref 26.0–34.0)
MCHC: 33.5 g/dL (ref 30.0–36.0)
MCV: 91.4 fL (ref 78.0–100.0)
PLATELETS: 175 10*3/uL (ref 150–400)
RBC: 3.49 MIL/uL — ABNORMAL LOW (ref 4.22–5.81)
RDW: 12.8 % (ref 11.5–15.5)
WBC: 5.8 10*3/uL (ref 4.0–10.5)

## 2014-08-05 MED ORDER — ASPIRIN EC 81 MG PO TBEC: 81.0000 mg | DELAYED_RELEASE_TABLET | Freq: Every day | ORAL | Status: DC

## 2014-08-05 MED ORDER — MUPIROCIN 2 % EX OINT: 1.0000 "application " | TOPICAL_OINTMENT | Freq: Two times a day (BID) | CUTANEOUS | Status: DC

## 2014-08-05 MED ORDER — CHLORHEXIDINE GLUCONATE CLOTH 2 % EX PADS: 6.0000 | MEDICATED_PAD | Freq: Every day | CUTANEOUS | Status: DC

## 2014-08-05 NOTE — Discharge Summary (Signed)
[facility] Discharge Summary  Alan Hawkins 1941/01/23 73 y.o. male  [id number]  Admission Date: 08/04/2014  Discharge Date: 08/05/2014  Physician: Rosetta Posner, MD  Admission Diagnosis: Aortic aneurysm, abdominal    HPI:   This is a 73 y.o. male who Dr. Donnetta Hutching saw on 07/15/14 for evaluation of abdominal aortic aneurysm. He is new to end-stage renal disease and had hemodialysis catheter placed by Dr. Bridgett Larsson on 10/15/2013 he subsequently underwent left upper arm AV fistula creation in [facility] and has had excellent restoration and excellent early function of his left upper arm fistula. He was being evaluated for potential transplant. He had an ultrasound of his aorta his initial evaluation of this and this revealed a infrarenal abdominal aortic aneurysm. He successfully underwent CT scan of his abdomen and pelvis on 07/03/2014 and had this for review as well. This did show a 5.9 cm infrarenal aortic aneurysm. There is no evidence of iliac artery aneurysms. The patient had no prior knowledge of his aneurysm. He does report a neighbor died from a ruptured aneurysm he is familiar with the disease process. He does have hypertension and hyperlipidemia. Does have a history of prior myocardial infarction and congestive heart failure. He is quite active well compensated.  Hospital Course:  The patient was admitted to the hospital and taken to the operating room on 08/04/2014 and underwent: Gore excluder stent graft repair of abdominal aortic aneurysm   The patient tolerated the procedure well and was transported to the PACU in stable condition. Nephrology was consulted regarding dialysis.  On POD 1, he was doing well.  His groin incisions were clean and intact without hematoma. He had palpable dorsalis pedis pulses bilaterally. He denied any pain other than soreness between shoulders from laying in the bed. He was able to void without difficulty. The  remainder of the hospital course consisted of increasing mobilization and increasing intake of solids without difficulty. His blood pressure was elevated at 174/46, but he was scheduled for dialysis in [place] immediately after discharge. He was discharged home on POD 1 in good condition.   CBC    Component Value Date/Time   WBC 5.8 08/05/2014 0337   RBC 3.49* 08/05/2014 0337   HGB 10.7* 08/05/2014 0337   HCT 31.9* 08/05/2014 0337   PLT 175 08/05/2014 0337   MCV 91.4 08/05/2014 0337   MCH 30.7 08/05/2014 0337   MCHC 33.5 08/05/2014 0337   RDW 12.8 08/05/2014 0337   LYMPHSABS 1.3 09/20/2013 1748   MONOABS 0.5 09/20/2013 1748   EOSABS 1.0* 09/20/2013 1748   BASOSABS 0.0 09/20/2013 1748    BMET    Component Value Date/Time   NA 138 08/05/2014 0337   K 4.7 08/05/2014 0337   CL 102 08/05/2014 0337   CO2 19 08/05/2014 0337   GLUCOSE 126* 08/05/2014 0337   BUN 60* 08/05/2014 0337   CREATININE 9.72* 08/05/2014 0337   CALCIUM 8.8 08/05/2014 0337   CALCIUM 8.6 09/21/2013 1243   GFRNONAA 5* 08/05/2014 0337   GFRAA 5* 08/05/2014 0337     Discharge Instructions:   The patient is discharged to home with extensive instructions on wound care and progressive ambulation.  They are instructed not to drive or perform any heavy lifting until returning to see the physician in his office.  Discharge Instructions   ABDOMINAL PROCEDURE/ANEURYSM REPAIR/AORTO-BIFEMORAL BYPASS:  Call MD for increased abdominal pain; cramping diarrhea; nausea/vomiting    Complete by:  As directed  Call MD for:  redness, tenderness, or signs of infection (pain, swelling, bleeding, redness, odor or green/yellow discharge around incision site)    Complete by:  As directed      Call MD for:  severe or increased pain, loss or decreased feeling  in affected limb(s)    Complete by:  As directed      Call MD for:  temperature >100.5    Complete by:  As directed      Discharge wound care:    Complete by:  As  directed   Shower daily with soap and water starting 08/06/14     Driving Restrictions    Complete by:  As directed   No driving for 2 weeks     Lifting restrictions    Complete by:  As directed   No lifting for 4 weeks     Resume previous diet    Complete by:  As directed            Discharge Diagnosis:  Aortic aneurysm, abdominal   Secondary Diagnosis: Patient Active Problem List   Diagnosis Date Noted  . Abdominal aortic aneurysm without rupture 08/04/2014  . AAA (abdominal aortic aneurysm) without rupture 07/15/2014  . Ischemic cardiomyopathy 09/20/2013  . Acute on chronic systolic heart failure [id number]  . Acute on chronic renal failure 09/20/2013  . Essential hypertension, benign 09/20/2013   Past Medical History  Diagnosis Date  . Essential hypertension, benign   . Hyperlipidemia   . GERD (gastroesophageal reflux disease)   . CHF (congestive heart failure)   . AAA (abdominal aortic aneurysm)   . Cancer 05-2014    basal cell carcinoma right ear  . Heart murmur   . CKD (chronic kidney disease) stage 3, GFR 30-59 ml/min     hd [facility] - started jan 2015  . Arthritis   . Anemia     iron infusions with dialysis occasionally       Medication List         acetaminophen 500 MG tablet  Commonly known as:  TYLENOL  Take 500-1,000 mg by mouth every 6 (six) hours as needed for moderate pain or headache.     amLODipine 10 MG tablet  Commonly known as:  NORVASC  Take 10 mg by mouth daily.     carvedilol 6.25 MG tablet  Commonly known as:  COREG  Take 6.25 mg by mouth 2 (two) times daily with a meal.     cloNIDine 0.2 MG tablet  Commonly known as:  CATAPRES  Take 0.2 mg by mouth 2 (two) times daily.     Co-Enzyme Q-10 30 MG Caps  Take 30 mg by mouth daily.     diphenhydrAMINE-zinc acetate cream  Commonly known as:  BENADRYL  Apply 1 application topically daily as needed for itching (after Dyalisis due to itching on his arms, may be caused from tape).      MIRALAX packet  Generic drug:  polyethylene glycol  Take 17 g by mouth daily. Take 17 g by mouth daily as needed (Constipation).     ondansetron 4 MG tablet  Commonly known as:  ZOFRAN  Take 4 mg by mouth every 6 (six) hours as needed for nausea or vomiting.     oxyCODONE 5 MG immediate release tablet  Commonly known as:  ROXICODONE  Take 1 tablet (5 mg total) by mouth every 6 (six) hours as needed.     sevelamer carbonate 800 MG tablet  Commonly known as:  RENVELA  Take 800-1,600 mg by mouth 3 (three) times daily with meals. 2 tabs with meals and 1 tab with snack     SYSTANE OP  Apply 1 drop to eye daily as needed (dry eyes).     torsemide 20 MG tablet  Commonly known as:  DEMADEX  Take 20 mg by mouth daily.        Roxicodone #20 No Refill  Disposition: Home  Patient's condition: is Good  Follow up: 1. Dr. Donnetta Hutching in 4 weeks with CTA   Virgina Jock, PA-C [facility] [phone] 08/05/2014  7:36 AM   - For VQI Registry use --- Instructions: Press F2 to tab through selections.  Delete question if not applicable.   Post-op:  Time to Extubation: [x ] In OR, [ ]  < 12 hrs, [ ]  12-24 hrs, [ ]  >=24 hrs Vasopressors Req. Post-op: No MI: No., [ ]  Troponin only, [ ]  EKG or Clinical New Arrhythmia: No CHF: No ICU Stay: 1 days Transfusion: No    Complications: Resp failure: No., [ ]  Pneumonia, [ ]  Ventilator Chg in renal function: No., [ ]  Inc. Cr > 0.5, [ ]  Temp. Dialysis, [ ]  Permanent dialysis Leg ischemia: No., no Surgery needed, [ ]  Yes, Surgery needed, [ ]  Amputation Bowel ischemia: No., [ ]  Medical Rx, [ ]  Surgical Rx Wound complication: No., [ ]  Superficial separation/infection, [ ]  Return to OR Return to OR: No  Return to OR for bleeding: No Stroke: No., [ ]  Minor, [ ]  Major  Discharge medications: Statin use:  No If No: [x ] For Medical reasons, unable to tolerate [ ]  Non-compliant, [ ]  Not-indicated ASA use:  Yes  If No: [ ]  For  Medical reasons, [ ]  Non-compliant, [ ]  Not-indicated Plavix use:  No If No: [ ]  For Medical reasons, [ ]  Non-compliant, [ ]  Not-indicated Beta blocker use:  Yes If No: [ ]  For Medical reasons, [ ]  Non-compliant, [ ]  Not-indicated

## 2014-08-05 NOTE — Progress Notes (Addendum)
  [facility] Progress Note  08/05/2014 7:29 AM 1 Day Post-Op  Subjective:  Doing well this morning. Having some soreness between shoulders from being in the bed. Ready to go home.   Tmax 99.8 BP sys 140s-180s 02 98% RA  Filed Vitals:   08/05/14 0700  BP:   Pulse: 88  Temp:   Resp: 10    Physical Exam: General: WDWN male resting in bed in NAD Cardiac: rrr, no m/g/r Lungs: ctab Abdomen: soft, non tender to palpation Incisions:  Bilateral groin incisions clean and soft. No hematoma. Extremities:  Feet are warm with palpable dorsalis pedis pulses bilaterally  CBC    Component Value Date/Time   WBC 5.8 08/05/2014 0337   RBC 3.49* 08/05/2014 0337   HGB 10.7* 08/05/2014 0337   HCT 31.9* 08/05/2014 0337   PLT 175 08/05/2014 0337   MCV 91.4 08/05/2014 0337   MCH 30.7 08/05/2014 0337   MCHC 33.5 08/05/2014 0337   RDW 12.8 08/05/2014 0337   LYMPHSABS 1.3 09/20/2013 1748   MONOABS 0.5 09/20/2013 1748   EOSABS 1.0* 09/20/2013 1748   BASOSABS 0.0 09/20/2013 1748    BMET    Component Value Date/Time   NA 138 08/05/2014 0337   K 4.7 08/05/2014 0337   CL 102 08/05/2014 0337   CO2 19 08/05/2014 0337   GLUCOSE 126* 08/05/2014 0337   BUN 60* 08/05/2014 0337   CREATININE 9.72* 08/05/2014 0337   CALCIUM 8.8 08/05/2014 0337   CALCIUM 8.6 09/21/2013 1243   GFRNONAA 5* 08/05/2014 0337   GFRAA 5* 08/05/2014 0337    INR    Component Value Date/Time   INR 1.15 08/04/2014 1100     Intake/Output Summary (Last 24 hours) at 08/05/14 0729 Last data filed at 08/05/14 0700  Gross per 24 hour  Intake   1400 ml  Output    925 ml  Net    475 ml     Assessment:  73 y.o. male is s/p: Gore excluder stent graft repair of abdominal aortic aneurysm 1 Day Post-Op  Plan: -Doing well post-operatively. Palpable dorsalis pedis pulses bilaterally. Groins without hematoma -Has voided without difficulty. Tolerating diet. Mobilizing.  -DVT prophylaxis:   lovenox -Will discharge home this am. Patient has dialysis scheduled today in [place] at 11.  -Follow up in 4 weeks with Dr. Donnetta Hutching with CTA  Virgina Jock, PA-C [facility] Office: [phone] Pager: [phone] 08/05/2014 7:29 AM     I have examined the patient, reviewed and agree with above.  Eivin Mascio, MD 08/05/2014 8:09 AM

## 2014-08-05 NOTE — Progress Notes (Signed)
Patient discharged to home with wife. Escorted out by wheelchair by Stage manager, Leta Jungling. Vital signs WDL on discharge- BP medications given prior to discharge.

## 2014-08-05 NOTE — Progress Notes (Signed)
Subjective: Interval History: has complaints sore between shoulders.  Objective: Vital signs in last 24 hours: Temp:  [97 F (36.1 C)-99.8 F (37.7 C)] 99.8 F (37.7 C) (10/27 0400) Pulse Rate:  [48-97] 88 (10/27 0700) Resp:  [10-27] 10 (10/27 0700) BP: (143-180)/(51-86) 167/76 mmHg (10/27 0600) SpO2:  [96 %-100 %] 98 % (10/27 0700) Arterial Line BP: (174-207)/(46-68) 174/46 mmHg (10/26 1400) Weight change:   Intake/Output from previous day: 10/26 0701 - 10/27 0700 In: 1400 [P.O.:900; I.V.:500] Out: 925 [Urine:925] Intake/Output this shift:    General appearance: alert, cooperative and plethoric  Lab Results:  Recent Labs  08/04/14 1100 08/05/14 0337  WBC 5.6 5.8  HGB 9.8* 10.7*  HCT 29.5* 31.9*  PLT 170 175   BMET:  Recent Labs  08/04/14 1100 08/05/14 0337  NA 140 138  K 5.5* 4.7  CL 105 102  CO2 19 19  GLUCOSE 101* 126*  BUN 54* 60*  CREATININE 9.32* 9.72*  CALCIUM 8.2* 8.8   No results found for this basename: PTH,  in the last 72 hours Iron Studies: No results found for this basename: IRON, TIBC, TRANSFERRIN, FERRITIN,  in the last 72 hours  Studies/Results: Dg Chest 2 View  08/04/2014   CLINICAL DATA:  Preop AAA.  EXAM: CHEST  2 VIEW  COMPARISON:  10/15/2013.  FINDINGS: Trachea is midline. Heart is mildly enlarged, stable. Lungs are somewhat hyperinflated with scattered linear scarring at the lung bases. No airspace consolidation or pleural fluid. Mild degenerative changes are seen in the spine.  IMPRESSION: Mild hyperinflation without acute finding.   Electronically Signed   By: Lorin Picket M.D.   On: 08/04/2014 08:04   Dg Chest Port 1 View  08/04/2014   CLINICAL DATA:  Status post repair of abdominal aortic aneurysm with some shortness of breath, initial evaluation  EXAM: PORTABLE CHEST - 1 VIEW  COMPARISON:  08/04/2014  FINDINGS: Moderate cardiac enlargement. Vascular pattern normal. Minimal lower lobe atelectasis. Right internal jugular central  line tip projects over superior vena cava. No pneumothorax appreciated.  IMPRESSION: Central line identified.  No acute findings.   Electronically Signed   By: Skipper Cliche M.D.   On: 08/04/2014 09:58    I have reviewed the patient's current medications.  Assessment/Plan: 1 ESRD stable after kayex.  For HD at this Unit in [place] at [place] today.  (I called yest) 2 AAA per [facility] 3 Anemia mild decrease Hb 4 HPTH  5 HTn lower at HD P D/c home , counseled.    LOS: 1 day   Mirella Gueye L 08/05/2014,7:32 AM

## 2014-08-07 ENCOUNTER — Telehealth: Payer: Self-pay | Admitting: Vascular Surgery

## 2014-08-07 NOTE — Telephone Encounter (Addendum)
Message copied by Gena Fray on Thu Aug 07, 2014 11:22 AM ------      Message from: Craig, [place] K      Created: Mon Aug 04, 2014  2:15 PM      Regarding: Schedule       Pt kidney functions ( BUN & Creatinine are High) so this is without contrast.                   ----- Message -----         From: Gabriel Earing, PA-C         Sent: 08/04/2014   1:31 PM           To: [facility] Charge Pool            S/p [facility] 08/04/14.  F/u with Dr. Donnetta Hutching in 4 weeks with CTA protocol.            Thanks,      Samantha       ------  08/07/14: left msg for patient to call [facility] for appt details. I scheduled his CT at [facility] because he lives in [place]. dpm

## 2014-08-27 ENCOUNTER — Other Ambulatory Visit: Payer: Self-pay

## 2014-09-09 ENCOUNTER — Encounter: Payer: Medicare Other | Admitting: Vascular Surgery

## 2014-09-11 ENCOUNTER — Ambulatory Visit (HOSPITAL_COMMUNITY)
Admission: RE | Admit: 2014-09-11 | Discharge: 2014-09-11 | Disposition: A | Discharge status: Home / Self Care | Payer: Medicare Other | Source: Ambulatory Visit | Attending: Vascular Surgery | Admitting: Vascular Surgery

## 2014-09-11 ENCOUNTER — Encounter: Payer: Medicare Other | Admitting: Vascular Surgery

## 2014-09-11 DIAGNOSIS — K5730 Diverticulosis of large intestine without perforation or abscess without bleeding: Secondary | ICD-10-CM | POA: Diagnosis not present

## 2014-09-11 DIAGNOSIS — N261 Atrophy of kidney (terminal): Secondary | ICD-10-CM | POA: Insufficient documentation

## 2014-09-11 DIAGNOSIS — K5900 Constipation, unspecified: Secondary | ICD-10-CM | POA: Diagnosis not present

## 2014-09-11 DIAGNOSIS — I714 Abdominal aortic aneurysm, without rupture, unspecified: Secondary | ICD-10-CM

## 2014-09-11 DIAGNOSIS — N400 Enlarged prostate without lower urinary tract symptoms: Secondary | ICD-10-CM | POA: Diagnosis not present

## 2014-09-11 DIAGNOSIS — Z48812 Encounter for surgical aftercare following surgery on the circulatory system: Secondary | ICD-10-CM

## 2014-09-15 ENCOUNTER — Encounter: Payer: Self-pay | Admitting: Vascular Surgery

## 2014-09-16 ENCOUNTER — Ambulatory Visit (INDEPENDENT_AMBULATORY_CARE_PROVIDER_SITE_OTHER): Payer: Medicare Other | Admitting: Vascular Surgery

## 2014-09-16 ENCOUNTER — Encounter: Payer: Self-pay | Admitting: Vascular Surgery

## 2014-09-16 VITALS — BP 159/75 | HR 65 | Resp 18 | Ht 70.0 in | Wt 177.0 lb

## 2014-09-16 DIAGNOSIS — I714 Abdominal aortic aneurysm, without rupture, unspecified: Secondary | ICD-10-CM

## 2014-09-16 DIAGNOSIS — Z48812 Encounter for surgical aftercare following surgery on the circulatory system: Secondary | ICD-10-CM

## 2014-09-16 NOTE — Progress Notes (Signed)
Here today for follow-up of his stent graft repair of abdominal aortic aneurysm by myself on 08/01/2014. He was treated with a percutaneous access to his aorta. He did extremely well hospital was discharged home on postoperative day #1.  On physical exam his abdominal exam reveals palpable aortic pulse with no expansile nature. He does have 2+ femoral pulses in his access sites are well-healed.  He did undergo CT scan for follow-up of abnormal reviewed this with him. This does show excellent positioning of the stent graft with no evidence of endoleak and several millimeter reduction in size since his preop study.  Impression and plan stable repair of abdominal aortic aneurysm with stent graft. Will continue usual activities with no limitation. We'll see him back in 6 months with repeat CT scan follow-up

## 2014-09-16 NOTE — Addendum Note (Signed)
Addended by: Dorthula Rue L on: 09/16/2014 12:04 PM   Modules accepted: Orders

## 2015-01-08 HISTORY — PX: KIDNEY TRANSPLANT: SHX239

## 2015-02-07 IMAGING — CT CT ABD-PELV W/O CM
2 of 4 series · 16 of 46 positions shown, 18 images · non-contrast
Comparison: None.

CLINICAL DATA: Abdominal aortic aneurysm without rupture.

EXAM:
CT ABDOMEN AND PELVIS WITHOUT CONTRAST
TECHNIQUE: Multidetector CT imaging of the abdomen and pelvis was performed
following the standard protocol without IV contrast.

[Series 2: abdomen/pelvis w/o contrast · axial · non-contrast · 0.80mm/px · z∈[+425,+840]mm · 13 of 91 slices shown, 15 images]
[im 4/91  soft-tissue]
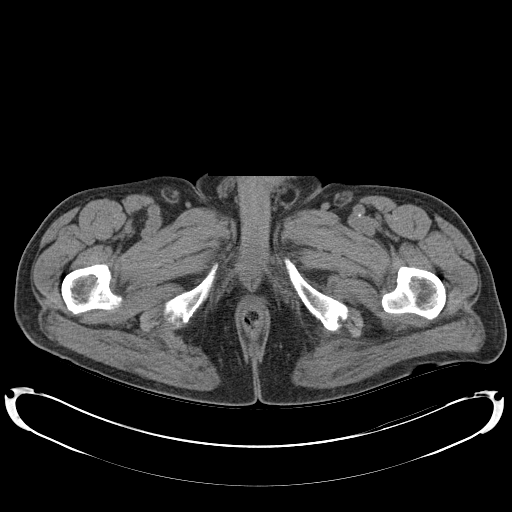
[im 4/91  bone]
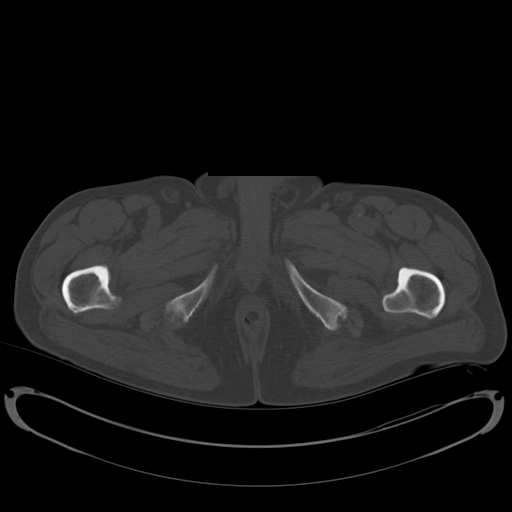
[im 12/91  soft-tissue]
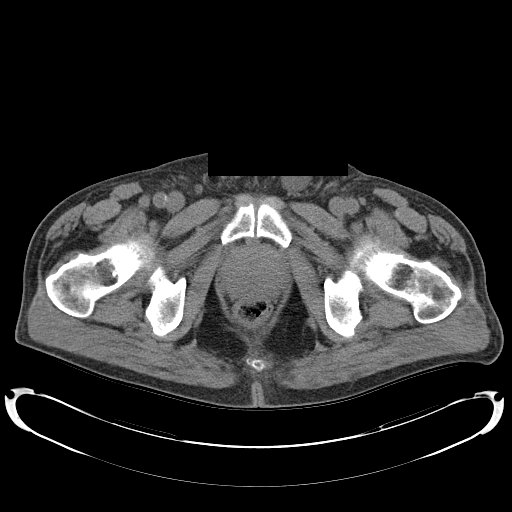
[im 19/91  soft-tissue]
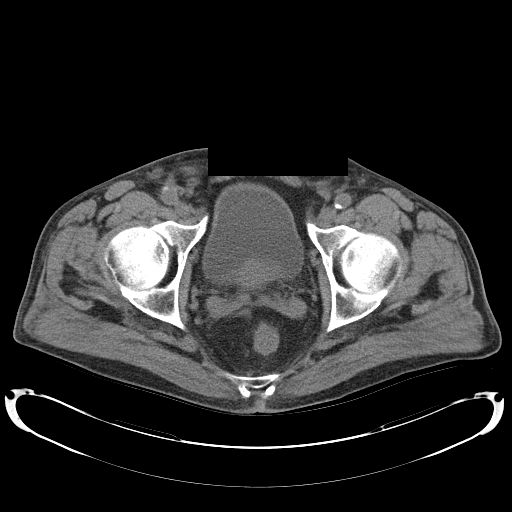
[im 27/91  soft-tissue]
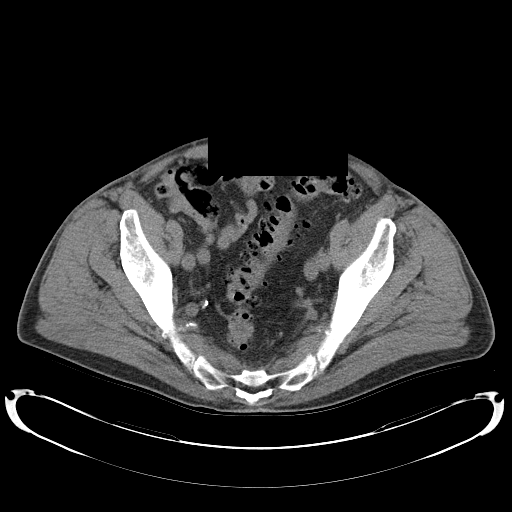
[im 31/91  soft-tissue]
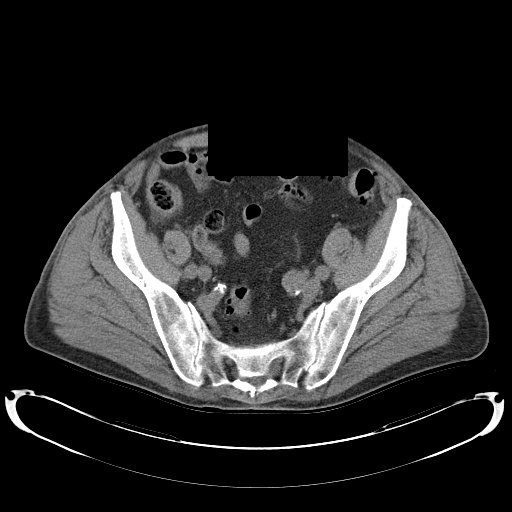
[im 38/91  soft-tissue]
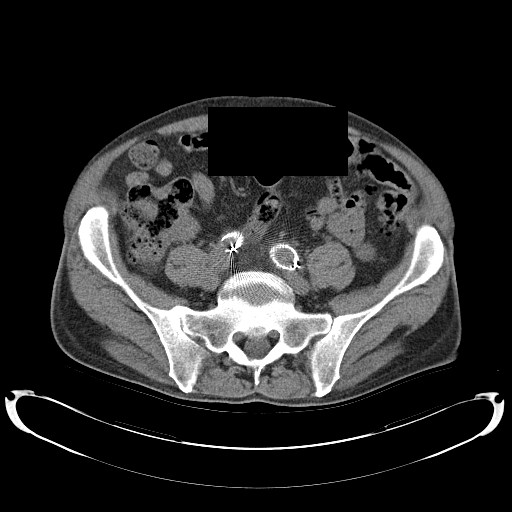
[im 46/91  soft-tissue]
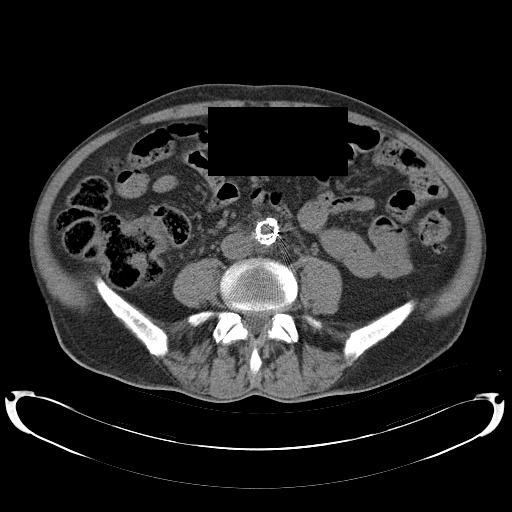
[im 53/91  soft-tissue]
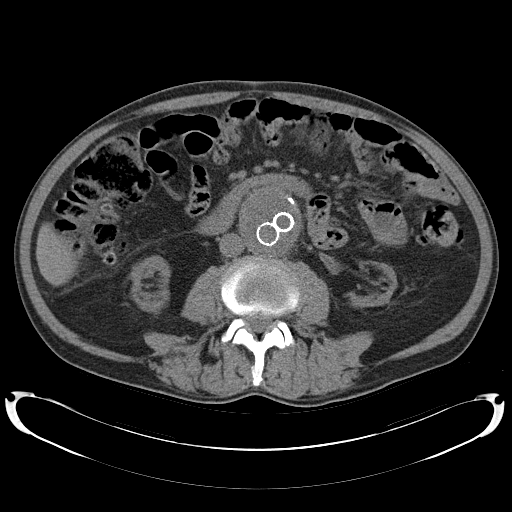
[im 61/91  soft-tissue]
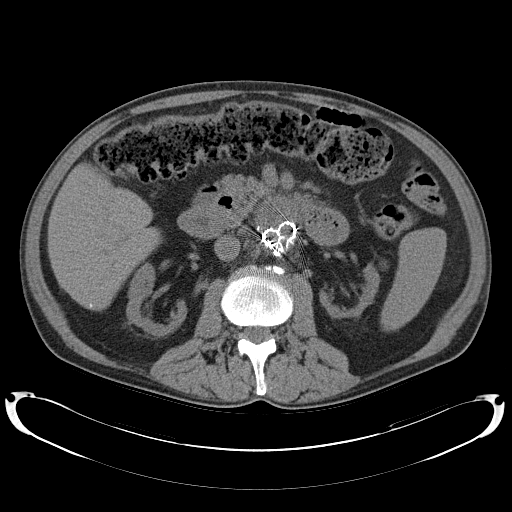
[im 61/91  bone]
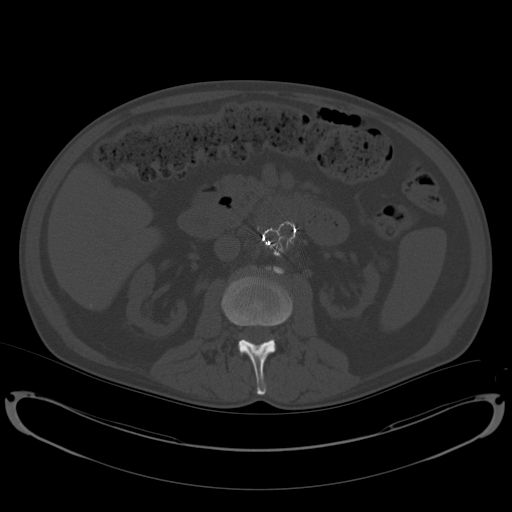
[im 64/91  soft-tissue]
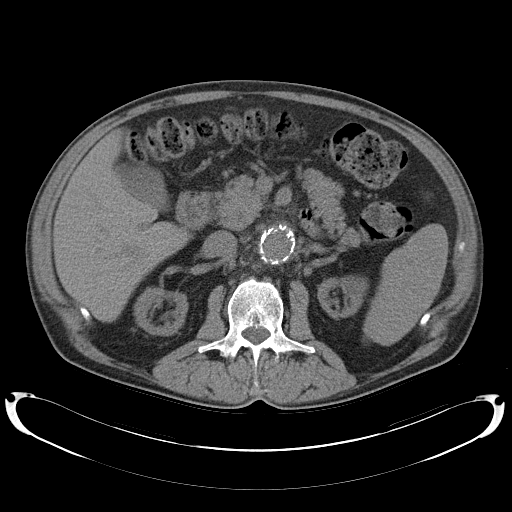
[im 72/91  soft-tissue]
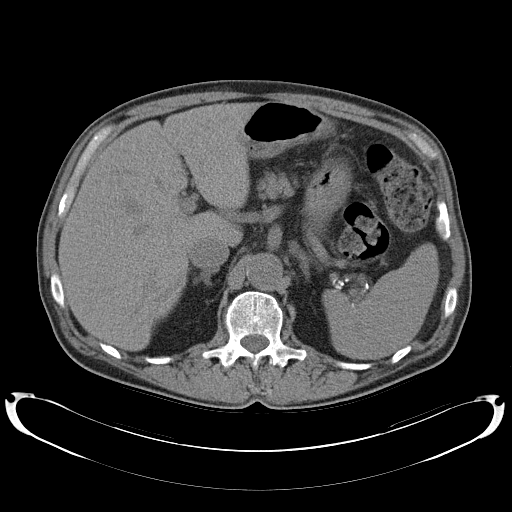
[im 79/91  soft-tissue]
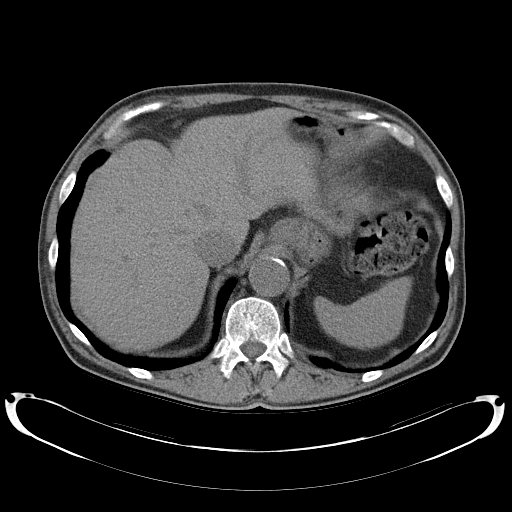
[im 87/91  soft-tissue]
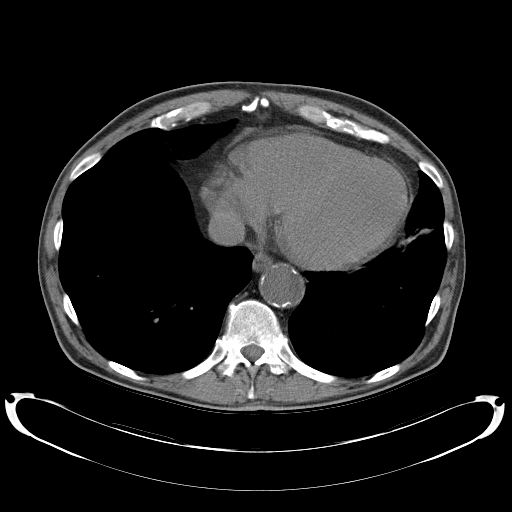

[Series 3: mpr cor 3.0mm · coronal · 0.78mm/px · 3 of 93 slices shown]
[im 31/93  soft-tissue]
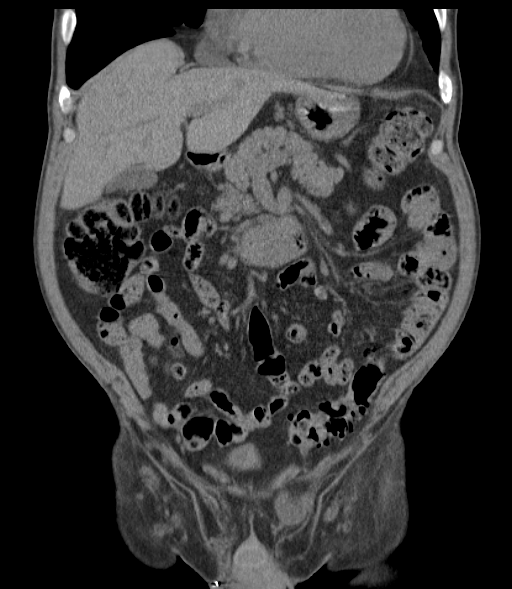
[im 41/93  soft-tissue]
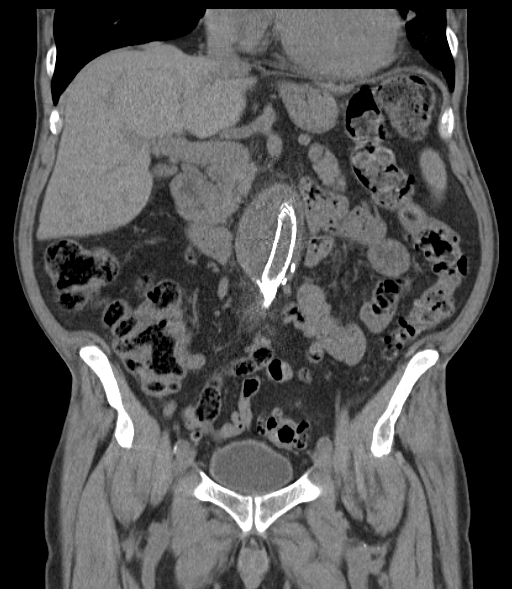
[im 52/93  soft-tissue]
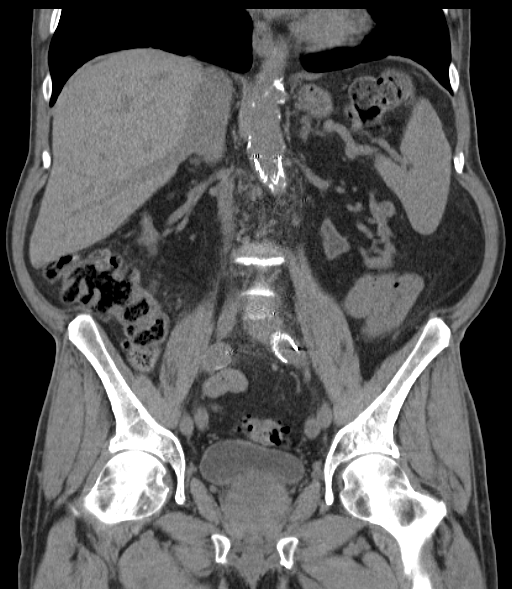

[16 of 46 positions shown; findings below may reference images not displayed]

FINDINGS: Scarring in the right middle lobe. Linear density in the lingula may
represent atelectasis or scarring. Heart size is mildly enlarged. No
pericardial effusion.

Liver and spleen are negative. Gallbladder negative. No biliary
ductal dilatation. Pancreas is normal.

Atrophic kidneys related to chronic renal failure and dialysis.
Moderate prostate enlargement. Bladder is negative.

Negative for bowel obstruction. Sigmoid diverticulosis without acute
inflammation. Moderate stool throughout the colon. Appendix not
visualized. Diverticular change also noted in the right colon.

Abdominal aortic aneurysm has been treated with stent graft. The
aneurysm sac measures 5.6 x 5.0 cm. Lack of intravenous contrast
precludes evaluation for endoleak. Graft position is infrarenal. No
prior studies for comparison.

Negative for mass or adenopathy.
IMPRESSION: Atrophic kidneys. No obstruction of the kidneys. Prostate
enlargement.

Constipation without bowel obstruction.  Diverticulosis.

Aortic stent graft in satisfactory position. Aneurysm sac measures
5.6 x 5.0 cm. No prior studies for comparison.

## 2015-03-16 ENCOUNTER — Encounter: Payer: Self-pay | Admitting: Vascular Surgery

## 2015-03-17 ENCOUNTER — Other Ambulatory Visit: Payer: Self-pay

## 2015-03-17 ENCOUNTER — Other Ambulatory Visit: Payer: Self-pay | Admitting: *Deleted

## 2015-03-17 ENCOUNTER — Encounter: Payer: Self-pay | Admitting: Vascular Surgery

## 2015-03-17 ENCOUNTER — Ambulatory Visit (INDEPENDENT_AMBULATORY_CARE_PROVIDER_SITE_OTHER): Payer: Medicare Other | Admitting: Vascular Surgery

## 2015-03-17 ENCOUNTER — Ambulatory Visit
Admission: RE | Admit: 2015-03-17 | Discharge: 2015-03-17 | Disposition: A | Discharge status: Home / Self Care | Payer: Medicare Other | Source: Ambulatory Visit | Attending: Vascular Surgery | Admitting: Vascular Surgery

## 2015-03-17 VITALS — BP 124/63 | HR 55 | Resp 18 | Ht 70.0 in | Wt 171.1 lb

## 2015-03-17 DIAGNOSIS — I714 Abdominal aortic aneurysm, without rupture, unspecified: Secondary | ICD-10-CM

## 2015-03-17 DIAGNOSIS — Z48812 Encounter for surgical aftercare following surgery on the circulatory system: Secondary | ICD-10-CM

## 2015-03-17 NOTE — Progress Notes (Signed)
HISTORY AND PHYSICAL     CC:  Check up Referring Provider:  Marjean Donna, MD  HPI: This is a 74 y.o. male who is s/p Gore excluder stent graft repair of AAA 08/04/14 by Dr. Donnetta Hutching.  The pt is doing well today.  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  He did have a CT scan this morning, however, contrast was not used.  He is on a beta blocker and CCB for his hypertension.  He is on an aspirin also.  Past Medical History  Diagnosis Date  . Essential hypertension, benign   . Hyperlipidemia   . GERD (gastroesophageal reflux disease)   . CHF (congestive heart failure)   . AAA (abdominal aortic aneurysm)   . Cancer 05-2014    basal cell carcinoma right ear  . Heart murmur   . CKD (chronic kidney disease) stage 3, GFR 30-59 ml/min     hd mwf - started jan 2015  . Arthritis   . Anemia     iron infusions with dialysis occasionally    Past Surgical History  Procedure Laterality Date  . Cataract extraction w/phaco  11/17/2011    Procedure: CATARACT EXTRACTION PHACO AND INTRAOCULAR LENS PLACEMENT (IOC);  Surgeon: Tonny Branch, MD;  Location: [facility];  Service: Ophthalmology;  Laterality: Right;  CDE=16.17  . Eye surgery      R KPE w/ IOL  . Cataract extraction w/phaco  12/05/2011    Procedure: CATARACT EXTRACTION PHACO AND INTRAOCULAR LENS PLACEMENT (IOC);  Surgeon: Tonny Branch, MD;  Location: [facility];  Service: Ophthalmology;  Laterality: Left;  CDE 16.22  . Vasectomy    . Insertion of dialysis catheter Right 10/15/2013    removed  . Colonoscopy N/A 05/20/2014    Procedure: COLONOSCOPY;  Surgeon: Rogene Houston, MD;  Location: [facility];  Service: Endoscopy;  Laterality: N/A;  730  . Excision of basal cell carcinoma right ear Right 05-2014  . Av fistula placement Left   . Abdominal aortic endovascular stent graft Bilateral 08/04/2014    Procedure: ABDOMINAL AORTIC ENDOVASCULAR STENT GRAFT;  Surgeon: Rosetta Posner, MD;   Location: [facility];  Service: Vascular;  Laterality: Bilateral;  . Kidney transplant Right 01-08-2015    [facility]    Allergies  Allergen Reactions  . Bee Venom Anaphylaxis  . Nitrofuran Derivatives Nausea And Vomiting    Nausea and vomiting , sever headache while on nitroglycerin infusion twice  . Nitroglycerin Other (See Comments)    Headache   nausea  . Tape Itching and Rash    Current Outpatient Prescriptions  Medication Sig Dispense Refill  . acetaminophen (TYLENOL) 500 MG tablet Take 500-1,000 mg by mouth every 6 (six) hours as needed for moderate pain or headache.    Marland Kitchen amLODipine (NORVASC) 10 MG tablet Take 10 mg by mouth daily.     Marland Kitchen aspirin EC 81 MG tablet Take 1 tablet (81 mg total) by mouth daily. 1 tablet 0  . carvedilol (COREG) 6.25 MG tablet Take 25 mg by mouth 2 (two) times daily with a meal.     . cetirizine (ZYRTEC) 10 MG tablet Take 10 mg by mouth at bedtime.    . cloNIDine (CATAPRES) 0.2 MG tablet Take 0.2 mg by mouth 2 (two) times daily.     Marland Kitchen Co-Enzyme Q-10 30 MG CAPS Take 30 mg by mouth daily.     Marland Kitchen docusate sodium (COLACE)  100 MG capsule Take 100 mg by mouth 2 (two) times daily.    . fluticasone (FLONASE) 50 MCG/ACT nasal spray Place into both nostrils daily.    Marland Kitchen labetalol (NORMODYNE) 200 MG tablet Take 200 mg by mouth 2 (two) times daily.    . mycophenolate (MYFORTIC) 180 MG EC tablet Take 180 mg by mouth 2 (two) times daily.    . ondansetron (ZOFRAN) 4 MG tablet Take 4 mg by mouth every 6 (six) hours as needed for nausea or vomiting.     Vladimir Faster Glycol-Propyl Glycol (SYSTANE OP) Apply 1 drop to eye daily as needed (dry eyes).     . polyethylene glycol (MIRALAX) packet Take 17 g by mouth daily. Take 17 g by mouth daily as needed (Constipation).    . predniSONE (DELTASONE) 5 MG tablet Take 5 mg by mouth daily with breakfast.    . ranitidine (ZANTAC) 150 MG tablet Take 150 mg by mouth daily.    Marland Kitchen sulfamethoxazole-trimethoprim (BACTRIM,SEPTRA) 400-80  MG per tablet Take 1 tablet by mouth 3 (three) times a week. Takes 1 tablet Monday/Wednesday/Friday.    . tacrolimus (PROGRAF) 1 MG capsule Take 1 mg by mouth every 12 (twelve) hours.    . valGANciclovir (VALCYTE) 450 MG tablet Take by mouth. Takes one tablet Monday/Wednesday/Friday.    . diphenhydrAMINE-zinc acetate (BENADRYL) cream Apply 1 application topically daily as needed for itching (after Dyalisis due to itching on his arms, may be caused from tape).     Marland Kitchen oxyCODONE (ROXICODONE) 5 MG immediate release tablet Take 1 tablet (5 mg total) by mouth every 6 (six) hours as needed. (Patient not taking: Reported on 03/17/2015) 20 tablet 0  . sevelamer carbonate (RENVELA) 800 MG tablet Take 800-1,600 mg by mouth 3 (three) times daily with meals. 2 tabs with meals and 1 tab with snack    . torsemide (DEMADEX) 20 MG tablet Take 20 mg by mouth daily.      No current facility-administered medications for this visit.   Facility-Administered Medications Ordered in Other Visits  Medication Dose Route Frequency Provider Last Rate Last Dose  . fentaNYL (SUBLIMAZE) injection 25-50 mcg  25-50 mcg Intravenous Q5 min PRN Lerry Liner, MD        Family History  Problem Relation Age of Onset  . Heart disease Mother   . Cancer Mother   . Heart disease Sister   . Heart attack Sister   . Stroke Father   . Hypertension Son     History   Social History  . Marital Status: Married    Spouse Name: N/A  . Number of Children: N/A  . Years of Education: N/A   Occupational History  . Not on file.   Social History Main Topics  . Smoking status: Former Smoker    Types: Cigarettes  . Smokeless tobacco: Never Used     Comment: "Stopped smoking at least 35-40 years ago"  . Alcohol Use: No     Comment: Occasional monthly beer  . Drug Use: No  . Sexual Activity: Not on file   Other Topics Concern  . Not on file   Social History Narrative     ROS: [x]  Positive   [ ]  Negative   [ ]  All sytems reviewed  and are negative  Cardiovascular: []  chest pain/pressure []  palpitations []  SOB lying flat []  DOE [x]  pain in legs while walking []  pain in feet when lying flat []  hx of DVT []  hx of phlebitis []  swelling  in legs []  varicose veins  Pulmonary: []  productive cough []  asthma []  wheezing  Neurologic: [x]  weakness in []  arms [x]  legs []  numbness in []  arms []  legs [] difficulty speaking or slurred speech []  temporary loss of vision in one eye []  dizziness  Hematologic: []  bleeding problems []  problems with blood clotting easily  GI []  vomiting blood []  blood in stool  GU: []  burning with urination []  blood in urine  Psychiatric: []  hx of major depression  Integumentary: []  rashes []  ulcers  Constitutional: []  fever []  chills   PHYSICAL EXAMINATION:  Filed Vitals:   03/17/15 1317  BP: 124/63  Pulse: 55  Resp: 18   Body mass index is 24.55 kg/(m^2).  General:  WDWN in NAD Gait: Not observed HENT: WNL, normocephalic Pulmonary: normal non-labored breathing  Cardiac: RRR, without  Murmurs, rubs or gallops; Abdomen: soft, NT, no masses Skin: without rashes, without ulcers  Vascular Exam/Pulses:  Right Left  Femoral 2+ (normal) 2+ (normal)  Popliteal 2+ (normal) 2+ (normal)  DP 2+ (normal) 2+ (normal)  PT 2+ (normal) 2+ (normal)   Extremities: without ischemic changes, without Gangrene , without cellulitis; without open wounds;  Musculoskeletal: no muscle wasting or atrophy  Neurologic: A&O X 3; Appropriate Affect ; SENSATION: normal; MOTOR FUNCTION:  moving all extremities equally. Speech is fluent/normal   Non-Invasive Vascular Imaging:   CT scan 03/17/15: IMPRESSION: Decreased size of native abdominal aortic aneurysm status post endovascular stent graft repair, currently measuring 4.7 cm compared to 5.6 cm previously. No evidence of retroperitoneal hemorrhage or other acute findings.  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  Expected postop appearance of renal transplant in right iliac fossa. No evidence of peritransplant fluid collection or hydronephrosis.  Colonic diverticulosis. No radiographic evidence of diverticulitis.  Stable moderately enlarged prostate, and small left inguinal hernia containing only fat.  Pt meds includes: Statin:  No. Beta Blocker:  Yes.   Aspirin:  Yes.   ACEI:  No. ARB:  No. Other Antiplatelet/Anticoagulant:  No.    ASSESSMENT/PLAN:: 74 y.o. male s/p EVAR 07/2014   -pt doing well since having is aneurysm repaired.  He does have palpable bilateral femoral, popliteal, and DP palpable pulses. -he does have an area of narrowing of the left iliac artery on CT scan, however, he does have 2+ palpable left femoral pulse.  Dr. Donnetta Hutching did discuss this with the pt and if he is to develop sudden onset of symptoms (pain, coldness, numbness), contact us immediately. -he is doing quite well from his right renal transplant in March 2016 and is off HD  -f/u with [place] in 1 year with EVAR u/s protocol.   Leontine Locket, PA-C [facility] [phone]  Clinic MD:  Pt seen and examined in conjunction with Dr. Donnetta Hutching  I have examined the patient, reviewed and agree with above.  Looks quite good today. Thrill about his renal transplant and being off hemodialysis. Transplant was in March. He did undergo noncontrast CT scan today because of his transplant. I reviewed his films and discussed this with the patient and his wife present. He does have excellent positioning and has nearly a centimeter diminished diameter of  aneurysmnow down to 4.7 cm. He does have some compression of his left limb of his stent graft as it goes through the native aortic bifurcation. He is having no claudication symptoms. He has normal femoral and pedal pulses bilaterally. He will continue his usual ctivities will see him in one year with  ultrasound of his aneurysm repair  Curt Jews,  MD 03/17/2015 1:54 PM

## 2015-03-18 NOTE — Addendum Note (Signed)
Addended by: Dorthula Rue L on: 03/18/2015 03:25 PM   Modules accepted: Orders

## 2016-03-17 ENCOUNTER — Encounter: Payer: Self-pay | Admitting: Vascular Surgery

## 2016-03-22 ENCOUNTER — Encounter: Payer: Self-pay | Admitting: Vascular Surgery

## 2016-03-22 ENCOUNTER — Ambulatory Visit (HOSPITAL_COMMUNITY)
Admission: RE | Admit: 2016-03-22 | Discharge: 2016-03-22 | Disposition: A | Discharge status: Home / Self Care | Payer: Medicare Other | Source: Ambulatory Visit | Attending: Vascular Surgery | Admitting: Vascular Surgery

## 2016-03-22 ENCOUNTER — Ambulatory Visit (INDEPENDENT_AMBULATORY_CARE_PROVIDER_SITE_OTHER): Payer: Medicare Other | Admitting: Vascular Surgery

## 2016-03-22 VITALS — BP 194/74 | HR 64 | Ht 70.0 in | Wt 181.1 lb

## 2016-03-22 DIAGNOSIS — I714 Abdominal aortic aneurysm, without rupture, unspecified: Secondary | ICD-10-CM

## 2016-03-22 DIAGNOSIS — N183 Chronic kidney disease, stage 3 (moderate): Secondary | ICD-10-CM | POA: Diagnosis not present

## 2016-03-22 DIAGNOSIS — I130 Hypertensive heart and chronic kidney disease with heart failure and stage 1 through stage 4 chronic kidney disease, or unspecified chronic kidney disease: Secondary | ICD-10-CM | POA: Diagnosis not present

## 2016-03-22 DIAGNOSIS — I509 Heart failure, unspecified: Secondary | ICD-10-CM | POA: Insufficient documentation

## 2016-03-22 DIAGNOSIS — Z48812 Encounter for surgical aftercare following surgery on the circulatory system: Secondary | ICD-10-CM | POA: Diagnosis not present

## 2016-03-22 NOTE — Progress Notes (Signed)
[facility] [place]  Patient name: Alan Hawkins MRN: [id number] DOB: [birth year] Sex: male  REASON FOR VISIT: Follow-up of stent graft repair of abdominal aortic aneurysm  HPI: Alan Hawkins is a 75 y.o. male day for follow-up of stent graft repair of abdominal aortic aneurysm on 08/04/2014. He is doing quite well medically. He had a kidney transplant proximal one year ago and this has normal renal function. No symptoms referable to his aneurysm  Past Medical History  Diagnosis Date  . Essential hypertension, benign   . Hyperlipidemia   . GERD (gastroesophageal reflux disease)   . CHF (congestive heart failure) ([place])   . AAA (abdominal aortic aneurysm) ([place])   . Cancer ([place]) [phone]    basal cell carcinoma right ear  . Heart murmur   . CKD (chronic kidney disease) stage 3, GFR 30-59 ml/min     hd mwf - started jan 2015  . Arthritis   . Anemia     iron infusions with dialysis occasionally    Family History  Problem Relation Age of Onset  . Heart disease Mother   . Cancer Mother   . Heart disease Sister   . Heart attack Sister   . Stroke Father   . Hypertension Son     SOCIAL HISTORY: Social History  Substance Use Topics  . Smoking status: Former Smoker    Types: Cigarettes  . Smokeless tobacco: Never Used     Comment: "Stopped smoking at least 35-40 years ago"  . Alcohol Use: No     Comment: Occasional monthly beer    Allergies  Allergen Reactions  . Bee Venom Anaphylaxis  . Nitrofuran Derivatives Nausea And Vomiting    Nausea and vomiting , sever headache while on nitroglycerin infusion twice  . Nitroglycerin Other (See Comments)    Headache   nausea  . Tape Itching and Rash    Current Outpatient Prescriptions  Medication Sig Dispense Refill  . acetaminophen (TYLENOL) 500 MG tablet Take 500-1,000 mg by mouth every 6 (six) hours as needed for moderate pain or headache.    Marland Kitchen amLODipine (NORVASC) 10  MG tablet Take 10 mg by mouth daily.     Marland Kitchen aspirin EC 81 MG tablet Take 1 tablet (81 mg total) by mouth daily. 1 tablet 0  . carvedilol (COREG) 6.25 MG tablet Take 25 mg by mouth 2 (two) times daily with a meal.     . cetirizine (ZYRTEC) 10 MG tablet Take 10 mg by mouth at bedtime.    . cloNIDine (CATAPRES) 0.2 MG tablet Take 0.2 mg by mouth 2 (two) times daily.     Marland Kitchen Co-Enzyme Q-10 30 MG CAPS Take 30 mg by mouth daily.     . diphenhydrAMINE-zinc acetate (BENADRYL) cream Apply 1 application topically daily as needed for itching (after Dyalisis due to itching on his arms, may be caused from tape).     Marland Kitchen docusate sodium (COLACE) 100 MG capsule Take 100 mg by mouth 2 (two) times daily.    . fluticasone (FLONASE) 50 MCG/ACT nasal spray Place into both nostrils daily.    Marland Kitchen labetalol (NORMODYNE) 200 MG tablet Take 200 mg by mouth 2 (two) times daily.    . mycophenolate (MYFORTIC) 180 MG EC tablet Take 180 mg by mouth 2 (two) times daily.    . ondansetron (ZOFRAN) 4 MG tablet Take 4 mg by mouth every 6 (six) hours as needed for nausea or vomiting.     Marland Kitchen  Polyethyl Glycol-Propyl Glycol (SYSTANE OP) Apply 1 drop to eye daily as needed (dry eyes).     . polyethylene glycol (MIRALAX) packet Take 17 g by mouth daily. Take 17 g by mouth daily as needed (Constipation).    . predniSONE (DELTASONE) 5 MG tablet Take 5 mg by mouth daily with breakfast.    . ranitidine (ZANTAC) 150 MG tablet Take 150 mg by mouth daily.    . sevelamer carbonate (RENVELA) 800 MG tablet Take 800-1,600 mg by mouth 3 (three) times daily with meals. 2 tabs with meals and 1 tab with snack    . sulfamethoxazole-trimethoprim (BACTRIM,SEPTRA) 400-80 MG per tablet Take 1 tablet by mouth 3 (three) times a week. Takes 1 tablet Monday/Wednesday/Friday.    . tacrolimus (PROGRAF) 1 MG capsule Take 1 mg by mouth every 12 (twelve) hours.    . torsemide (DEMADEX) 20 MG tablet Take 20 mg by mouth daily.     . valGANciclovir (VALCYTE) 450 MG tablet Take  by mouth. Takes one tablet Monday/Wednesday/Friday.    Marland Kitchen oxyCODONE (ROXICODONE) 5 MG immediate release tablet Take 1 tablet (5 mg total) by mouth every 6 (six) hours as needed. (Patient not taking: Reported on 03/17/2015) 20 tablet 0   No current facility-administered medications for this visit.   Facility-Administered Medications Ordered in Other Visits  Medication Dose Route Frequency Provider Last Rate Last Dose  . fentaNYL (SUBLIMAZE) injection 25-50 mcg  25-50 mcg Intravenous Q5 min PRN Lerry Liner, MD        REVIEW OF SYSTEMS:  [X]  denotes positive finding, [ ]  denotes negative finding Cardiac  Comments:  Chest pain or chest pressure:    Shortness of breath upon exertion:    Short of breath when lying flat:    Irregular heart rhythm:        Vascular    Pain in calf, thigh, or hip brought on by ambulation:    Pain in feet at night that wakes you up from your sleep:     Blood clot in your veins:    Leg swelling:         Pulmonary    Oxygen at home:    Productive cough:     Wheezing:         Neurologic    Sudden weakness in arms or legs:     Sudden numbness in arms or legs:     Sudden onset of difficulty speaking or slurred speech:    Temporary loss of vision in one eye:     Problems with dizziness:         Gastrointestinal    Blood in stool:     Vomited blood:         Genitourinary    Burning when urinating:     Blood in urine:        Psychiatric    Major depression:         Hematologic    Bleeding problems:    Problems with blood clotting too easily:        Skin    Rashes or ulcers:        Constitutional    Fever or chills:      PHYSICAL EXAM: Filed Vitals:   03/22/16 0842 03/22/16 0847  BP: 191/72 194/74  Pulse: 64   Height: 5\' 10"  (1.778 m)   Weight: 181 lb 1.6 oz (82.146 kg)   SpO2: 99%     GENERAL: The patient is a well-nourished male, in no  acute distress. The vital signs are documented above. VASCULAR: 2+ radial pulses. Does have a  well-developed left upper arm AV fistula with an excellent thrill. 2+ femoral pulses and 2+ popliteal pulses bilaterally PULMONARY: There is good air . ABDOMEN: Soft and non-tender with no aneurysm palpable and no masses MUSCULOSKELETAL: There are no major deformities or cyanosis. NEUROLOGIC: No focal weakness or paresthesias are detected. SKIN: There are no ulcers or rashes noted. PSYCHIATRIC: The patient has a normal affect.  DATA:  Ultrasound today reveals continued diminished size of his aneurysm now down to maximal diameter of 2.6 cm. This compares to 4.7 by CT scan one year ago  MEDICAL ISSUES: Excellent result of stent graft repair of abdominal aortic aneurysm. He will continue his usual activities. We will see him again in 2 years with repeat ultrasound    Rosetta Posner, MD [facility] Tel [phone] Pager [phone]

## 2016-03-24 ENCOUNTER — Encounter: Payer: Self-pay | Admitting: Cardiology

## 2016-08-21 ENCOUNTER — Inpatient Hospital Stay (HOSPITAL_COMMUNITY)
Admission: EM | Admit: 2016-08-21 | Discharge: 2016-08-22 | DRG: 065 | Disposition: A | Discharge status: Home / Self Care | Payer: Medicare Other | Attending: Internal Medicine | Admitting: Internal Medicine

## 2016-08-21 ENCOUNTER — Encounter (HOSPITAL_COMMUNITY): Payer: Self-pay | Admitting: *Deleted

## 2016-08-21 ENCOUNTER — Emergency Department (HOSPITAL_COMMUNITY): Payer: Medicare Other

## 2016-08-21 DIAGNOSIS — R531 Weakness: Secondary | ICD-10-CM

## 2016-08-21 DIAGNOSIS — E785 Hyperlipidemia, unspecified: Secondary | ICD-10-CM | POA: Diagnosis present

## 2016-08-21 DIAGNOSIS — Z87891 Personal history of nicotine dependence: Secondary | ICD-10-CM | POA: Diagnosis not present

## 2016-08-21 DIAGNOSIS — N184 Chronic kidney disease, stage 4 (severe): Secondary | ICD-10-CM | POA: Diagnosis not present

## 2016-08-21 DIAGNOSIS — I150 Renovascular hypertension: Secondary | ICD-10-CM | POA: Diagnosis not present

## 2016-08-21 DIAGNOSIS — I10 Essential (primary) hypertension: Secondary | ICD-10-CM | POA: Diagnosis present

## 2016-08-21 DIAGNOSIS — I714 Abdominal aortic aneurysm, without rupture, unspecified: Secondary | ICD-10-CM | POA: Diagnosis present

## 2016-08-21 DIAGNOSIS — K219 Gastro-esophageal reflux disease without esophagitis: Secondary | ICD-10-CM | POA: Diagnosis present

## 2016-08-21 DIAGNOSIS — N183 Chronic kidney disease, stage 3 (moderate): Secondary | ICD-10-CM | POA: Diagnosis present

## 2016-08-21 DIAGNOSIS — R200 Anesthesia of skin: Secondary | ICD-10-CM | POA: Diagnosis not present

## 2016-08-21 DIAGNOSIS — Z79899 Other long term (current) drug therapy: Secondary | ICD-10-CM

## 2016-08-21 DIAGNOSIS — Z85828 Personal history of other malignant neoplasm of skin: Secondary | ICD-10-CM | POA: Diagnosis not present

## 2016-08-21 DIAGNOSIS — Z940 Kidney transplant status: Secondary | ICD-10-CM | POA: Diagnosis not present

## 2016-08-21 DIAGNOSIS — I129 Hypertensive chronic kidney disease with stage 1 through stage 4 chronic kidney disease, or unspecified chronic kidney disease: Secondary | ICD-10-CM | POA: Diagnosis present

## 2016-08-21 DIAGNOSIS — I255 Ischemic cardiomyopathy: Secondary | ICD-10-CM | POA: Diagnosis present

## 2016-08-21 DIAGNOSIS — I679 Cerebrovascular disease, unspecified: Secondary | ICD-10-CM | POA: Diagnosis not present

## 2016-08-21 DIAGNOSIS — Z7952 Long term (current) use of systemic steroids: Secondary | ICD-10-CM | POA: Diagnosis not present

## 2016-08-21 DIAGNOSIS — R471 Dysarthria and anarthria: Secondary | ICD-10-CM | POA: Diagnosis present

## 2016-08-21 DIAGNOSIS — I672 Cerebral atherosclerosis: Secondary | ICD-10-CM | POA: Diagnosis present

## 2016-08-21 DIAGNOSIS — Z7982 Long term (current) use of aspirin: Secondary | ICD-10-CM | POA: Diagnosis not present

## 2016-08-21 DIAGNOSIS — I639 Cerebral infarction, unspecified: Secondary | ICD-10-CM | POA: Diagnosis present

## 2016-08-21 DIAGNOSIS — I6350 Cerebral infarction due to unspecified occlusion or stenosis of unspecified cerebral artery: Secondary | ICD-10-CM | POA: Diagnosis not present

## 2016-08-21 DIAGNOSIS — G8194 Hemiplegia, unspecified affecting left nondominant side: Secondary | ICD-10-CM | POA: Diagnosis present

## 2016-08-21 DIAGNOSIS — I6381 Other cerebral infarction due to occlusion or stenosis of small artery: Secondary | ICD-10-CM | POA: Diagnosis present

## 2016-08-21 HISTORY — DX: Other cerebral infarction due to occlusion or stenosis of small artery: I63.81

## 2016-08-21 HISTORY — DX: Cerebrovascular disease, unspecified: I67.9

## 2016-08-21 LAB — URINALYSIS, ROUTINE W REFLEX MICROSCOPIC
Bilirubin Urine: NEGATIVE
Glucose, UA: NEGATIVE mg/dL
Hgb urine dipstick: NEGATIVE
Ketones, ur: NEGATIVE mg/dL
LEUKOCYTES UA: NEGATIVE
NITRITE: NEGATIVE
PH: 6 (ref 5.0–8.0)
Protein, ur: NEGATIVE mg/dL

## 2016-08-21 LAB — CBC
HEMATOCRIT: 42 % (ref 39.0–52.0)
HEMOGLOBIN: 13.9 g/dL (ref 13.0–17.0)
MCH: 30.5 pg (ref 26.0–34.0)
MCHC: 33.1 g/dL (ref 30.0–36.0)
MCV: 92.3 fL (ref 78.0–100.0)
Platelets: 207 10*3/uL (ref 150–400)
RBC: 4.55 MIL/uL (ref 4.22–5.81)
RDW: 13.6 % (ref 11.5–15.5)
WBC: 7.8 10*3/uL (ref 4.0–10.5)

## 2016-08-21 LAB — COMPREHENSIVE METABOLIC PANEL
ALT: 12 U/L — ABNORMAL LOW (ref 17–63)
AST: 17 U/L (ref 15–41)
Albumin: 3.9 g/dL (ref 3.5–5.0)
Alkaline Phosphatase: 55 U/L (ref 38–126)
Anion gap: 7 (ref 5–15)
BILIRUBIN TOTAL: 0.7 mg/dL (ref 0.3–1.2)
BUN: 24 mg/dL — AB (ref 6–20)
CO2: 25 mmol/L (ref 22–32)
CREATININE: 1.91 mg/dL — AB (ref 0.61–1.24)
Calcium: 9.9 mg/dL (ref 8.9–10.3)
Chloride: 103 mmol/L (ref 101–111)
GFR calc Af Amer: 38 mL/min — ABNORMAL LOW (ref >60)
GFR, EST NON AFRICAN AMERICAN: 33 mL/min — AB (ref >60)
Glucose, Bld: 124 mg/dL — ABNORMAL HIGH (ref 65–99)
POTASSIUM: 4.1 mmol/L (ref 3.5–5.1)
Sodium: 135 mmol/L (ref 135–145)
TOTAL PROTEIN: 6.7 g/dL (ref 6.5–8.1)

## 2016-08-21 LAB — DIFFERENTIAL
BASOS ABS: 0 10*3/uL (ref 0.0–0.1)
Basophils Relative: 0 %
EOS ABS: 1.1 10*3/uL — AB (ref 0.0–0.7)
Eosinophils Relative: 14 %
LYMPHS ABS: 0.7 10*3/uL (ref 0.7–4.0)
Lymphocytes Relative: 10 %
MONOS PCT: 10 %
Monocytes Absolute: 0.8 10*3/uL (ref 0.1–1.0)
Neutro Abs: 5.2 10*3/uL (ref 1.7–7.7)
Neutrophils Relative %: 66 %

## 2016-08-21 LAB — RAPID URINE DRUG SCREEN, HOSP PERFORMED
Amphetamines: NOT DETECTED
Barbiturates: NOT DETECTED
Benzodiazepines: NOT DETECTED
Cocaine: NOT DETECTED
OPIATES: NOT DETECTED
Tetrahydrocannabinol: NOT DETECTED

## 2016-08-21 LAB — I-STAT CHEM 8, ED
BUN: 25 mg/dL — ABNORMAL HIGH (ref 6–20)
CALCIUM ION: 1.35 mmol/L (ref 1.15–1.40)
CREATININE: 1.9 mg/dL — AB (ref 0.61–1.24)
Chloride: 102 mmol/L (ref 101–111)
GLUCOSE: 123 mg/dL — AB (ref 65–99)
HCT: 42 % (ref 39.0–52.0)
HEMOGLOBIN: 14.3 g/dL (ref 13.0–17.0)
POTASSIUM: 4.3 mmol/L (ref 3.5–5.1)
Sodium: 138 mmol/L (ref 135–145)
TCO2: 27 mmol/L (ref 0–100)

## 2016-08-21 LAB — MRSA PCR SCREENING: MRSA BY PCR: NEGATIVE

## 2016-08-21 LAB — APTT: APTT: 27 s (ref 24–36)

## 2016-08-21 LAB — ETHANOL: Alcohol, Ethyl (B): <5 mg/dL (ref <5)

## 2016-08-21 LAB — PROTIME-INR
INR: 1.07
Prothrombin Time: 13.9 seconds (ref 11.4–15.2)

## 2016-08-21 LAB — POCT I-STAT TROPONIN I: TROPONIN I, POC: 0.02 ng/mL (ref 0.00–0.08)

## 2016-08-21 MED ORDER — DOCUSATE SODIUM 100 MG PO CAPS
100.0000 mg | ORAL_CAPSULE | Freq: Two times a day (BID) | ORAL | Status: DC
  Administered 2016-08-21 – 2016-08-22 (×3): 100 mg via ORAL
  Filled 2016-08-21 (×3): qty 1

## 2016-08-21 MED ORDER — PREDNISONE 10 MG PO TABS
5.0000 mg | ORAL_TABLET | Freq: Every day | ORAL | Status: DC
  Administered 2016-08-22: 5 mg via ORAL
  Filled 2016-08-21: qty 1

## 2016-08-21 MED ORDER — ATORVASTATIN CALCIUM 40 MG PO TABS
40.0000 mg | ORAL_TABLET | Freq: Every day | ORAL | Status: DC
  Administered 2016-08-21: 40 mg via ORAL
  Filled 2016-08-21: qty 1

## 2016-08-21 MED ORDER — SENNOSIDES-DOCUSATE SODIUM 8.6-50 MG PO TABS: 1.0000 | ORAL_TABLET | Freq: Every evening | ORAL | Status: DC | PRN

## 2016-08-21 MED ORDER — POLYVINYL ALCOHOL 1.4 % OP SOLN: 1.0000 [drp] | Freq: Every day | OPHTHALMIC | Status: DC | PRN

## 2016-08-21 MED ORDER — CO-ENZYME Q-10 30 MG PO CAPS: 30.0000 mg | ORAL_CAPSULE | Freq: Every day | ORAL | Status: DC

## 2016-08-21 MED ORDER — ASPIRIN 81 MG PO CHEW
81.0000 mg | CHEWABLE_TABLET | Freq: Once | ORAL | Status: AC
  Administered 2016-08-21: 81 mg via ORAL
  Filled 2016-08-21: qty 1

## 2016-08-21 MED ORDER — POLYETHYLENE GLYCOL 3350 17 G PO PACK
17.0000 g | PACK | Freq: Every day | ORAL | Status: DC
  Administered 2016-08-21 – 2016-08-22 (×2): 17 g via ORAL
  Filled 2016-08-21 (×2): qty 1

## 2016-08-21 MED ORDER — LORATADINE 10 MG PO TABS
10.0000 mg | ORAL_TABLET | Freq: Every day | ORAL | Status: DC
  Administered 2016-08-21 – 2016-08-22 (×2): 10 mg via ORAL
  Filled 2016-08-21 (×2): qty 1

## 2016-08-21 MED ORDER — HEPARIN SODIUM (PORCINE) 5000 UNIT/ML IJ SOLN
5000.0000 [IU] | Freq: Three times a day (TID) | INTRAMUSCULAR | Status: DC
Start: 2016-08-21 — End: 2016-08-22
  Administered 2016-08-21 – 2016-08-22 (×4): 5000 [IU] via SUBCUTANEOUS
  Filled 2016-08-21 (×4): qty 1

## 2016-08-21 MED ORDER — MYCOPHENOLATE SODIUM 180 MG PO TBEC
180.0000 mg | DELAYED_RELEASE_TABLET | Freq: Two times a day (BID) | ORAL | Status: DC
  Administered 2016-08-21 – 2016-08-22 (×2): 180 mg via ORAL
  Filled 2016-08-21 (×7): qty 1

## 2016-08-21 MED ORDER — TAMSULOSIN HCL 0.4 MG PO CAPS
0.4000 mg | ORAL_CAPSULE | Freq: Every day | ORAL | Status: DC
  Administered 2016-08-21: 0.4 mg via ORAL
  Filled 2016-08-21: qty 1

## 2016-08-21 MED ORDER — STROKE: EARLY STAGES OF RECOVERY BOOK
Freq: Once | Status: AC
  Administered 2016-08-21: 1
  Filled 2016-08-21: qty 1

## 2016-08-21 MED ORDER — FLUTICASONE PROPIONATE 50 MCG/ACT NA SUSP: 1.0000 | Freq: Every day | NASAL | Status: DC | PRN

## 2016-08-21 MED ORDER — ASPIRIN EC 81 MG PO TBEC
81.0000 mg | DELAYED_RELEASE_TABLET | Freq: Every day | ORAL | Status: DC
  Administered 2016-08-21 – 2016-08-22 (×2): 81 mg via ORAL
  Filled 2016-08-21 (×2): qty 1

## 2016-08-21 MED ORDER — SODIUM CHLORIDE 0.9 % IV SOLN
INTRAVENOUS | Status: DC
  Administered 2016-08-21: 15:00:00 via INTRAVENOUS

## 2016-08-21 MED ORDER — TACROLIMUS 1 MG PO CAPS
3.0000 mg | ORAL_CAPSULE | Freq: Two times a day (BID) | ORAL | Status: DC
  Administered 2016-08-21 – 2016-08-22 (×2): 3 mg via ORAL
  Filled 2016-08-21 (×7): qty 3

## 2016-08-21 MED ORDER — FAMOTIDINE 20 MG PO TABS
20.0000 mg | ORAL_TABLET | Freq: Two times a day (BID) | ORAL | Status: DC
  Administered 2016-08-21 – 2016-08-22 (×3): 20 mg via ORAL
  Filled 2016-08-21 (×3): qty 1

## 2016-08-21 NOTE — H&P (Signed)
History and Physical    Alan Hawkins Alan Hawkins DOB: 03/20/1941  DOA: 08/21/2016 PCP: Jani Gravel, MD  Patient coming from: Home   Chief Complaint: Home   HPI: Alan Hawkins is a 75 y.o. male with medical history significant of hypertension well controlled and kidney transplant on immunosuppressors. Presented to the emergency department complaining of sudden onset of left-sided numbness and weakness that started today around 6:30 AM. Patient went to bed yesterday at 11:00 PM where he was feeling at baseline. Patient arrived to the ED at 10:30. He reports that his left leg was working and felt numbness. Also noted that he was slurring his speech and he felt a burning sensation on the right side of his face. His wife took his blood pressure was elevated over the 200's. In the emergency department he was taken for MRI which showed acute infarction within the right paramedian pons. Patient admitted for stroke workup.   ED Course: MRI show acute infarction aspirin 81 mg given, no TPA given out of window for treatment. Creatinine 1.9.  Review of Systems:   General: no changes in body weight, no fever chills or decrease in energy.  HEENT: no blurry vision, hearing changes or sore throat Respiratory: no dyspnea, coughing, wheezing CV: no chest pain, no palpitations GI: no nausea, vomiting, abdominal pain, diarrhea, constipation GU: no dysuria, burning on urination, increased urinary frequency, hematuria  Ext:. No deformities Neuro: Positive for Left-sided weakness and numbness, right facial numbness, no vision change or hearing loss Skin: No rashes, lesions or wounds. MSK: No muscle spasm, no deformity, no limitation of range of movement in spin Heme: No easy bruising.  Travel history: No recent long distant travel.   Past Medical History:  Diagnosis Date  . AAA (abdominal aortic aneurysm) ([place])   . Anemia    iron infusions with dialysis occasionally  . Arthritis   . Cancer ([place])  [phone]   basal cell carcinoma right ear  . CHF (congestive heart failure) ([place])   . CKD (chronic kidney disease) stage 3, GFR 30-59 ml/min    hd mwf - started jan 2015  . Essential hypertension, benign   . GERD (gastroesophageal reflux disease)   . Heart murmur   . Hyperlipidemia     Past Surgical History:  Procedure Laterality Date  . ABDOMINAL AORTIC ENDOVASCULAR STENT GRAFT Bilateral 08/04/2014   Procedure: ABDOMINAL AORTIC ENDOVASCULAR STENT GRAFT;  Surgeon: Rosetta Posner, MD;  Location: [facility];  Service: Vascular;  Laterality: Bilateral;  . AV FISTULA PLACEMENT Left   . CATARACT EXTRACTION W/PHACO  11/17/2011   Procedure: CATARACT EXTRACTION PHACO AND INTRAOCULAR LENS PLACEMENT (IOC);  Surgeon: Tonny Branch, MD;  Location: [facility];  Service: Ophthalmology;  Laterality: Right;  CDE=16.17  . CATARACT EXTRACTION W/PHACO  12/05/2011   Procedure: CATARACT EXTRACTION PHACO AND INTRAOCULAR LENS PLACEMENT (IOC);  Surgeon: Tonny Branch, MD;  Location: [facility];  Service: Ophthalmology;  Laterality: Left;  CDE 16.22  . COLONOSCOPY N/A 05/20/2014   Procedure: COLONOSCOPY;  Surgeon: Rogene Houston, MD;  [facility];  Service: Endoscopy;  Laterality: N/A;  730  . excision of basal cell carcinoma right ear Right 05-2014  . EYE SURGERY     R KPE w/ IOL  . INSERTION OF DIALYSIS CATHETER Right 10/15/2013   removed  . KIDNEY TRANSPLANT Right 01-08-2015   WAKE fOREST BAPTIST  . VASECTOMY       reports that he has quit smoking. His smoking use included Cigarettes.  He has never used smokeless tobacco. He reports that he does not drink alcohol or use drugs.  Allergies  Allergen Reactions  . Bee Venom Anaphylaxis  . Nitrofuran Derivatives Nausea And Vomiting    Nausea and vomiting , sever headache while on nitroglycerin infusion twice  . Nitroglycerin Other (See Comments)    Headache   nausea Headache   nausea  . Tape Itching and Rash    Family History  Problem Relation Age of Onset  .  Heart disease Mother   . Cancer Mother   . Heart disease Sister   . Heart attack Sister   . Stroke Father   . Hypertension Son    Family history reviewed and not pertinent  Prior to Admission medications   Medication Sig Start Date End Date Taking? Authorizing Provider  acetaminophen (TYLENOL) 500 MG tablet Take 500-1,000 mg by mouth every 6 (six) hours as needed for moderate pain or headache.   Yes Historical Provider, MD  amLODipine (NORVASC) 10 MG tablet Take 10 mg by mouth daily.    Yes Historical Provider, MD  aspirin EC 81 MG tablet Take 1 tablet (81 mg total) by mouth daily. 08/05/14  Yes Alvia Grove, PA-C  calcitRIOL (ROCALTROL) 0.25 MCG capsule Take 0.25 mcg by mouth every other day.   Yes Historical Provider, MD  cetirizine (ZYRTEC) 10 MG tablet Take 10 mg by mouth at bedtime.   Yes Historical Provider, MD  cloNIDine (CATAPRES) 0.1 MG tablet Take 0.1 mg by mouth 2 (two) times daily.   Yes Historical Provider, MD  Co-Enzyme Q-10 30 MG CAPS Take 30 mg by mouth daily.    Yes Historical Provider, MD  docusate sodium (COLACE) 100 MG capsule Take 100 mg by mouth 2 (two) times daily.   Yes Historical Provider, MD  fluticasone (FLONASE) 50 MCG/ACT nasal spray Place 1 spray into both nostrils daily as needed.    Yes Historical Provider, MD  labetalol (NORMODYNE) 300 MG tablet Take 300 mg by mouth 2 (two) times daily.   Yes Historical Provider, MD  mycophenolate (MYFORTIC) 180 MG EC tablet Take 180 mg by mouth 2 (two) times daily.   Yes Historical Provider, MD  Polyethyl Glycol-Propyl Glycol (SYSTANE OP) Apply 1 drop to eye daily as needed (dry eyes).    Yes Historical Provider, MD  polyethylene glycol (MIRALAX) packet Take 17 g by mouth daily. Take 17 g by mouth daily as needed (Constipation). 11/19/13  Yes Historical Provider, MD  predniSONE (DELTASONE) 5 MG tablet Take 5 mg by mouth daily with breakfast.   Yes Historical Provider, MD  ranitidine (ZANTAC) 150 MG tablet Take 150 mg by  mouth 2 (two) times daily.    Yes Historical Provider, MD  tacrolimus (PROGRAF) 0.5 MG capsule Take 3 mg by mouth 2 (two) times daily.   Yes Historical Provider, MD  tamsulosin (FLOMAX) 0.4 MG CAPS capsule Take 1 capsule by mouth at bedtime. 08/21/16  Yes Historical Provider, MD    Physical Exam: Vitals:   08/21/16 1211 08/21/16 1230 08/21/16 1235 08/21/16 1300  BP: 150/91 160/80  165/74  Pulse: 67 65  63  Resp: 13 13  16   Temp:   98 F (36.7 C)   TempSrc:      SpO2: 97% 98%  98%  Weight:      Height:         Constitutional: NAD, calm, comfortable Eyes: PERRL, lids and conjunctivae normal ENMT: Mucous membranes are moist. Posterior pharynx clear of any  exudate or lesions.Normal dentition.  Neck: normal, supple, no masses, no thyromegaly,  Respiratory: clear to auscultation bilaterally, no wheezing, no crackles. Normal respiratory effort. .  Cardiovascular: RRR, no murmurs / rubs / gallops. No extremity edema. 2+ pedal pulses. No carotid bruits.  Abdomen: no tenderness, no masses palpated. No hepatosplenomegaly. Bowel sounds positive.  Musculoskeletal: no clubbing / cyanosis. No joint deformity upper and lower extremities. Good ROM, no contractures. Normal muscle tone.  Skin: no rashes, lesions, ulcers. No induration, left-sided fistula Neurologic: Strength 5/5 left lower extremity, 4/5 left upper extremity, nasal flattening on the right, mild drift on the left lower extremities. Pronator drift abnormal. Sensation intact DTRs intact. Visual fields intact, NIH score 3 Psychiatric: Normal judgment and insight. Alert and oriented x 3. Normal mood.    Labs on Admission: I have personally reviewed following labs and imaging studies  CBC:  Recent Labs Lab 08/21/16 1038 08/21/16 1051  WBC 7.8  --   NEUTROABS 5.2  --   HGB 13.9 14.3  HCT 42.0 42.0  MCV 92.3  --   PLT 207  --    Basic Metabolic Panel:  Recent Labs Lab 08/21/16 1038 08/21/16 1051  NA 135 138  K 4.1 4.3    CL 103 102  CO2 25  --   GLUCOSE 124* 123*  BUN 24* 25*  CREATININE 1.91* 1.90*  CALCIUM 9.9  --    GFR: Estimated Creatinine Clearance: 34.7 mL/min (by C-G formula based on SCr of 1.9 mg/dL (H)). Liver Function Tests:  Recent Labs Lab 08/21/16 1038  AST 17  ALT 12*  ALKPHOS 55  BILITOT 0.7  PROT 6.7  ALBUMIN 3.9   No results for input(s): LIPASE, AMYLASE in the last 168 hours. No results for input(s): AMMONIA in the last 168 hours. Coagulation Profile:  Recent Labs Lab 08/21/16 1038  INR 1.07   Cardiac Enzymes: No results for input(s): CKTOTAL, CKMB, CKMBINDEX, TROPONINI in the last 168 hours. BNP (last 3 results) No results for input(s): PROBNP in the last 8760 hours. HbA1C: No results for input(s): HGBA1C in the last 72 hours. CBG: No results for input(s): GLUCAP in the last 168 hours. Lipid Profile: No results for input(s): CHOL, HDL, LDLCALC, TRIG, CHOLHDL, LDLDIRECT in the last 72 hours. Thyroid Function Tests: No results for input(s): TSH, T4TOTAL, FREET4, T3FREE, THYROIDAB in the last 72 hours. Anemia Panel: No results for input(s): VITAMINB12, FOLATE, FERRITIN, TIBC, IRON, RETICCTPCT in the last 72 hours. Urine analysis:    Component Value Date/Time   COLORURINE YELLOW 08/21/2016 [place] 08/21/2016 1038   LABSPEC <1.005 (L) 08/21/2016 1038   PHURINE 6.0 08/21/2016 1038   GLUCOSEU NEGATIVE 08/21/2016 1038   HGBUR NEGATIVE 08/21/2016 1038   BILIRUBINUR NEGATIVE 08/21/2016 1038   KETONESUR NEGATIVE 08/21/2016 1038   PROTEINUR NEGATIVE 08/21/2016 1038   UROBILINOGEN 0.2 07/29/2014 0905   NITRITE NEGATIVE 08/21/2016 1038   LEUKOCYTESUR NEGATIVE 08/21/2016 1038   Sepsis Labs: !!!!!!!!!!!!!!!!!!!!!!!!!!!!!!!!!!!!!!!!!!!! @LABRCNTIP (procalcitonin:4,lacticidven:4) )No results found for this or any previous visit (from the past 240 hour(s)).   Radiological Exams on Admission: Mr Brain Wo Contrast  Result Date: 08/21/2016 CLINICAL  DATA:  Sudden onset of left-sided weakness beginning today. EXAM: MRI HEAD WITHOUT CONTRAST TECHNIQUE: Multiplanar, multiecho pulse sequences of the brain and surrounding structures were obtained without intravenous contrast. COMPARISON:  None. FINDINGS: Brain: There is acute infarction within the right para median pons. No swelling or hemorrhage. No other acute infarction. Cerebral hemispheres show moderate chronic small-vessel  ischemic changes affecting the deep and subcortical white matter. No large vessel territory infarction. No mass lesion, hemorrhage, hydrocephalus or extra-axial collection. No pituitary mass. Vascular: Major vessels at the base of the brain show flow. Skull and upper cervical spine: Negative Sinuses/Orbits: Clear/normal Other: None IMPRESSION: Acute infarction within the right para median pons. No swelling or hemorrhage. Chronic small-vessel ischemic changes elsewhere throughout the brain. Major vessels at the base of the brain show flow. Electronically Signed   By: Nelson Chimes M.D.   On: 08/21/2016 12:01    EKG: Independently reviewed. Sinus rhythm with some PVC's, ? RBBB  Assessment/Plan  Acute stroke in the right paramedian pons -Admit to telemetry -Neurology consult -Obtain MRA -Lipid panel, A1c -Carotid Doppler -Echo -Allow permissive hypertension - if BP of 220/120 will give antihypertensive -Patient passed sip test - will order regular diet -PT/OT/Speech eval -ASA 81 mg -We'll start on Lipitor 40 mg -IVF NS -Repeat EKG in the morning  Hypertension - stable -Holding BP medications -Allow permissive hypertension - if BP more than 220/120 with labetalol -Monitor BP  History of kidney transplant - on immunosuppressives -Resume home medications -Monitor creatinine  DVT prophylaxis: Heparin Code Status: Full Family Communication: Wife at bedside plan discussed on details Disposition Plan: Anticipate discharge to previous home environment.  Consults  called: Neurology Admission status: Inpatient telemetry   Chipper Oman MD [facility] Pager [phone]  If 7PM-7AM, please contact night-coverage [email or web address] Password [email or web address]  08/21/2016, 1:41 PM

## 2016-08-21 NOTE — Progress Notes (Signed)
Pharmacy Note:   Spoke with Chaney Malling NP regarding drug-drug interaction between new order for Lipitor and tracrolimus.  These drugs increase the serum concentrations of each other.  K Schorr noted that she is aware and Pharmacist will leave sticky note and progress note for MD to evaluate in the morning.  Thanks,  Antionette Char PharmD BCPS 08/21/2016 7:32 PM

## 2016-08-21 NOTE — ED Notes (Signed)
ED Provider at bedside. 

## 2016-08-21 NOTE — Progress Notes (Signed)

## 2016-08-21 NOTE — ED Notes (Signed)
Patient transported to MRI 

## 2016-08-21 NOTE — ED Provider Notes (Signed)
[facility] DEPT Provider Note   CSN: [id number] Arrival date & time: 08/21/16  1013  By signing my name below, I, Avnee Patel, attest that this documentation has been prepared under the direction and in the presence of Jola Schmidt, MD  Electronically Signed: Delton Prairie, ED Scribe. 08/21/16. 10:41 AM.   History   Chief Complaint Chief Complaint  Patient presents with  . Weakness    left sided   The history is provided by the patient and the spouse. No language interpreter was used.   HPI Comments:  Alan Hawkins is a 75 y.o. male, HTN who presents to the Emergency Department complaining of sudden onset left sided numbness which began around 6:30 AM today. Pt awoke and notes his left leg "would not work". He notes he felt fine before going to bed at 11 PM yesterday. Pt was back at baseline around 7:30 AM today before his symptoms suddenly came back. Partner notes pt's blood pressure was elevated this morning. Pt denies a hx of DM, hx of MI, current headaches, chest pain and SOB. He is a non-smoker.No alleviating factors noted.  Pt denies any other symptoms or complaints at this time.   Past Medical History:  Diagnosis Date  . AAA (abdominal aortic aneurysm) ([place])   . Anemia    iron infusions with dialysis occasionally  . Arthritis   . Cancer ([place]) [phone]   basal cell carcinoma right ear  . CHF (congestive heart failure) ([place])   . CKD (chronic kidney disease) stage 3, GFR 30-59 ml/min    hd mwf - started jan 2015  . Essential hypertension, benign   . GERD (gastroesophageal reflux disease)   . Heart murmur   . Hyperlipidemia     Patient Active Problem List   Diagnosis Date Noted  . Abdominal aortic aneurysm without rupture ([place]) 08/04/2014  . AAA (abdominal aortic aneurysm) without rupture ([place]) 07/15/2014  . Ischemic cardiomyopathy 09/20/2013  . Acute on chronic systolic heart failure ([place]) 09/20/2013  . Acute on chronic renal failure ([place]) 09/20/2013  . Essential  hypertension, benign 09/20/2013    Past Surgical History:  Procedure Laterality Date  . ABDOMINAL AORTIC ENDOVASCULAR STENT GRAFT Bilateral 08/04/2014   Procedure: ABDOMINAL AORTIC ENDOVASCULAR STENT GRAFT;  Surgeon: Rosetta Posner, MD;  Location: [facility];  Service: Vascular;  Laterality: Bilateral;  . AV FISTULA PLACEMENT Left   . CATARACT EXTRACTION W/PHACO  11/17/2011   Procedure: CATARACT EXTRACTION PHACO AND INTRAOCULAR LENS PLACEMENT (IOC);  Surgeon: Tonny Branch, MD;  Location[facility];  Service: Ophthalmology;  Laterality: Right;  CDE=16.17  . CATARACT EXTRACTION W/PHACO  12/05/2011   Procedure: CATARACT EXTRACTION PHACO AND INTRAOCULAR LENS PLACEMENT (IOC);  Surgeon: Tonny Branch, MD;  Location[facility];  Service: Ophthalmology;  Laterality: Left;  CDE 16.22  . COLONOSCOPY N/A 05/20/2014   Procedure: COLONOSCOPY;  Surgeon: Rogene Houston, MD;  [facility];  Service: Endoscopy;  Laterality: N/A;  730  . excision of basal cell carcinoma right ear Right 05-2014  . EYE SURGERY     R KPE w/ IOL  . INSERTION OF DIALYSIS CATHETER Right 10/15/2013   removed  . KIDNEY TRANSPLANT Right 01-08-2015   WAKE fOREST BAPTIST  . VASECTOMY         Home Medications    Prior to Admission medications   Medication Sig Start Date End Date Taking? Authorizing Provider  acetaminophen (TYLENOL) 500 MG tablet Take 500-1,000 mg by mouth every 6 (six) hours as needed  for moderate pain or headache.    Historical Provider, MD  amLODipine (NORVASC) 10 MG tablet Take 10 mg by mouth daily.     Historical Provider, MD  aspirin EC 81 MG tablet Take 1 tablet (81 mg total) by mouth daily. 08/05/14   Alvia Grove, PA-C  carvedilol (COREG) 6.25 MG tablet Take 25 mg by mouth 2 (two) times daily with a meal.     Historical Provider, MD  cetirizine (ZYRTEC) 10 MG tablet Take 10 mg by mouth at bedtime.    Historical Provider, MD  cloNIDine (CATAPRES) 0.2 MG tablet Take 0.2 mg by mouth 2 (two) times daily.      Historical Provider, MD  Co-Enzyme Q-10 30 MG CAPS Take 30 mg by mouth daily.     Historical Provider, MD  diphenhydrAMINE-zinc acetate (BENADRYL) cream Apply 1 application topically daily as needed for itching (after Dyalisis due to itching on his arms, may be caused from tape).     Historical Provider, MD  docusate sodium (COLACE) 100 MG capsule Take 100 mg by mouth 2 (two) times daily.    Historical Provider, MD  fluticasone (FLONASE) 50 MCG/ACT nasal spray Place into both nostrils daily.    Historical Provider, MD  labetalol (NORMODYNE) 200 MG tablet Take 200 mg by mouth 2 (two) times daily.    Historical Provider, MD  mycophenolate (MYFORTIC) 180 MG EC tablet Take 180 mg by mouth 2 (two) times daily.    Historical Provider, MD  ondansetron (ZOFRAN) 4 MG tablet Take 4 mg by mouth every 6 (six) hours as needed for nausea or vomiting.     Historical Provider, MD  oxyCODONE (ROXICODONE) 5 MG immediate release tablet Take 1 tablet (5 mg total) by mouth every 6 (six) hours as needed. Patient not taking: Reported on 03/17/2015 08/04/14   Hulen Shouts Rhyne, PA-C  Polyethyl Glycol-Propyl Glycol (SYSTANE OP) Apply 1 drop to eye daily as needed (dry eyes).     Historical Provider, MD  polyethylene glycol (MIRALAX) packet Take 17 g by mouth daily. Take 17 g by mouth daily as needed (Constipation). 11/19/13   Historical Provider, MD  predniSONE (DELTASONE) 5 MG tablet Take 5 mg by mouth daily with breakfast.    Historical Provider, MD  ranitidine (ZANTAC) 150 MG tablet Take 150 mg by mouth daily.    Historical Provider, MD  sevelamer carbonate (RENVELA) 800 MG tablet Take 800-1,600 mg by mouth 3 (three) times daily with meals. 2 tabs with meals and 1 tab with snack    Historical Provider, MD  sulfamethoxazole-trimethoprim (BACTRIM,SEPTRA) 400-80 MG per tablet Take 1 tablet by mouth 3 (three) times a week. Takes 1 tablet Monday/Wednesday/Friday.    Historical Provider, MD  tacrolimus (PROGRAF) 1 MG capsule Take 1  mg by mouth every 12 (twelve) hours.    Historical Provider, MD  torsemide (DEMADEX) 20 MG tablet Take 20 mg by mouth daily.     Historical Provider, MD  valGANciclovir (VALCYTE) 450 MG tablet Take by mouth. Takes one tablet Monday/Wednesday/Friday.    Historical Provider, MD    Family History Family History  Problem Relation Age of Onset  . Heart disease Mother   . Cancer Mother   . Heart disease Sister   . Heart attack Sister   . Stroke Father   . Hypertension Son     Social History Social History  Substance Use Topics  . Smoking status: Former Smoker    Types: Cigarettes  . Smokeless tobacco: Never Used  Comment: "Stopped smoking at least 35-40 years ago"  . Alcohol use No     Comment: Occasional monthly beer     Allergies   Bee venom; Nitrofuran derivatives; Nitroglycerin; and Tape   Review of Systems Review of Systems  Respiratory: Negative for shortness of breath.   Cardiovascular: Negative for chest pain.  Neurological: Positive for weakness, numbness and headaches.   10 systems reviewed and all are negative for acute change except as noted in the HPI.  Physical Exam Updated Vital Signs BP 150/91   Pulse 67   Temp 98 F (36.7 C) (Oral)   Resp 13   Ht 5\' 10"  (1.778 m)   Wt 180 lb (81.6 kg)   SpO2 97%   BMI 25.83 kg/m   Physical Exam  Constitutional: He is oriented to person, place, and time. He appears well-developed and well-nourished.  HENT:  Head: Normocephalic and atraumatic.  Eyes: EOM are normal. Pupils are equal, round, and reactive to light.  Neck: Normal range of motion.  Cardiovascular: Normal rate, regular rhythm, normal heart sounds and intact distal pulses.   Pulmonary/Chest: Effort normal and breath sounds normal. No respiratory distress.  Abdominal: Soft. He exhibits no distension. There is no tenderness.  Musculoskeletal: Normal range of motion.  Neurological: He is alert and oriented to person, place, and time.  5/5 Hawkins in  major muscle groups of left  upper and lower extremities. Mild drift LUE and left leg. Abnormal finger to nose with left hand. Speech normal. No facial asymetry.   Skin: Skin is warm and dry.  Psychiatric: He has a normal mood and affect. Judgment normal.  Nursing note and vitals reviewed.    ED Treatments / Results  DIAGNOSTIC STUDIES:  Oxygen Saturation is 99% on RA, normal by my interpretation.    COORDINATION OF CARE:  10:38 AM Discussed treatment plan with pt at bedside and pt agreed to plan.  Labs (all labs ordered are listed, but only abnormal results are displayed) Labs Reviewed  DIFFERENTIAL - Abnormal; Notable for the following:       Result Value   Eosinophils Absolute 1.1 (*)    All other components within normal limits  COMPREHENSIVE METABOLIC PANEL - Abnormal; Notable for the following:    Glucose, Bld 124 (*)    BUN 24 (*)    Creatinine, Ser 1.91 (*)    ALT 12 (*)    GFR calc non Af Amer 33 (*)    GFR calc Af Amer 38 (*)    All other components within normal limits  URINALYSIS, ROUTINE W REFLEX MICROSCOPIC (NOT AT [facility]) - Abnormal; Notable for the following:    Specific Gravity, Urine <1.005 (*)    All other components within normal limits  I-STAT CHEM 8, ED - Abnormal; Notable for the following:    BUN 25 (*)    Creatinine, Ser 1.90 (*)    Glucose, Bld 123 (*)    All other components within normal limits  ETHANOL  PROTIME-INR  APTT  CBC  RAPID URINE DRUG SCREEN, HOSP PERFORMED  I-STAT TROPOININ, ED    EKG  EKG Interpretation  Date/Time:  Sunday August 21 2016 10:24:35 EST Ventricular Rate:  60 PR Interval:    QRS Duration: 168 QT Interval:  447 QTC Calculation: 447 R Axis:   -101 Text Interpretation:  Sinus rhythm Ventricular premature complex Prolonged PR interval RBBB and LAFB No significant change was found Confirmed by Eddie Koc  MD, Aryeh Butterfield ([id number]) on 08/21/2016 12:26:18 PM  Radiology Mr Brain Wo Contrast  Result Date:  08/21/2016 CLINICAL DATA:  Sudden onset of left-sided weakness beginning today. EXAM: MRI HEAD WITHOUT CONTRAST TECHNIQUE: Multiplanar, multiecho pulse sequences of the brain and surrounding structures were obtained without intravenous contrast. COMPARISON:  None. FINDINGS: Brain: There is acute infarction within the right para median pons. No swelling or hemorrhage. No other acute infarction. Cerebral hemispheres show moderate chronic small-vessel ischemic changes affecting the deep and subcortical white matter. No large vessel territory infarction. No mass lesion, hemorrhage, hydrocephalus or extra-axial collection. No pituitary mass. Vascular: Major vessels at the base of the brain show flow. Skull and upper cervical spine: Negative Sinuses/Orbits: Clear/normal Other: None IMPRESSION: Acute infarction within the right para median pons. No swelling or hemorrhage. Chronic small-vessel ischemic changes elsewhere throughout the brain. Major vessels at the base of the brain show flow. Electronically Signed   By: Nelson Chimes M.D.   On: 08/21/2016 12:01    Procedures Procedures (including critical care time)  Medications Ordered in ED Medications - No data to display   Initial Impression / Assessment and Plan / ED Course  I have reviewed the triage vital signs and the nursing notes.  Pertinent labs & imaging results that were available during my care of the patient were reviewed by me and considered in my medical decision making (see chart for details).  Clinical Course     Acute right brain stroke with left sided symptoms. Admit for stroke workup. Diona Fanti now.   Final Clinical Impressions(s) / ED Diagnoses   Final diagnoses:  Cerebrovascular accident (CVA), unspecified mechanism ([place])    New Prescriptions New Prescriptions   No medications on file   I personally performed the services described in this documentation, which was scribed in my presence. The recorded information has been  reviewed and is accurate.        Jola Schmidt, MD 08/21/16 [phone]

## 2016-08-21 NOTE — ED Triage Notes (Signed)
Wife reports pt woke her up at 0630 this morning c/o left sided weakness and slurred speech. Wife reports last seen normal at 2230 last night. Pt and wife report the signs are intermittent and have occurred 3 times this morning. Pt's hand grip on left side are weaker than the right. Pt also reports headache above right eye.

## 2016-08-22 ENCOUNTER — Encounter (HOSPITAL_COMMUNITY): Payer: Self-pay | Admitting: Internal Medicine

## 2016-08-22 ENCOUNTER — Inpatient Hospital Stay (HOSPITAL_COMMUNITY): Payer: Medicare Other

## 2016-08-22 ENCOUNTER — Other Ambulatory Visit: Payer: Self-pay

## 2016-08-22 DIAGNOSIS — E785 Hyperlipidemia, unspecified: Secondary | ICD-10-CM | POA: Diagnosis present

## 2016-08-22 DIAGNOSIS — N184 Chronic kidney disease, stage 4 (severe): Secondary | ICD-10-CM | POA: Diagnosis present

## 2016-08-22 DIAGNOSIS — I714 Abdominal aortic aneurysm, without rupture: Secondary | ICD-10-CM

## 2016-08-22 DIAGNOSIS — I679 Cerebrovascular disease, unspecified: Secondary | ICD-10-CM

## 2016-08-22 DIAGNOSIS — I6350 Cerebral infarction due to unspecified occlusion or stenosis of unspecified cerebral artery: Secondary | ICD-10-CM

## 2016-08-22 DIAGNOSIS — I639 Cerebral infarction, unspecified: Principal | ICD-10-CM

## 2016-08-22 DIAGNOSIS — Z7952 Long term (current) use of systemic steroids: Secondary | ICD-10-CM

## 2016-08-22 DIAGNOSIS — I10 Essential (primary) hypertension: Secondary | ICD-10-CM | POA: Diagnosis present

## 2016-08-22 DIAGNOSIS — I255 Ischemic cardiomyopathy: Secondary | ICD-10-CM

## 2016-08-22 DIAGNOSIS — I6381 Other cerebral infarction due to occlusion or stenosis of small artery: Secondary | ICD-10-CM | POA: Diagnosis present

## 2016-08-22 HISTORY — DX: Cerebrovascular disease, unspecified: I67.9

## 2016-08-22 HISTORY — DX: Other cerebral infarction due to occlusion or stenosis of small artery: I63.81

## 2016-08-22 LAB — BASIC METABOLIC PANEL
Anion gap: 7 (ref 5–15)
BUN: 21 mg/dL — ABNORMAL HIGH (ref 6–20)
CO2: 25 mmol/L (ref 22–32)
Calcium: 9.6 mg/dL (ref 8.9–10.3)
Chloride: 108 mmol/L (ref 101–111)
Creatinine, Ser: 1.66 mg/dL — ABNORMAL HIGH (ref 0.61–1.24)
GFR calc Af Amer: 45 mL/min — ABNORMAL LOW (ref >60)
GFR calc non Af Amer: 39 mL/min — ABNORMAL LOW (ref >60)
Glucose, Bld: 101 mg/dL — ABNORMAL HIGH (ref 65–99)
Potassium: 4 mmol/L (ref 3.5–5.1)
Sodium: 140 mmol/L (ref 135–145)

## 2016-08-22 LAB — ECHOCARDIOGRAM COMPLETE
Height: 70 in
Weight: 2848 oz

## 2016-08-22 LAB — LIPID PANEL
CHOL/HDL RATIO: 5 ratio
CHOLESTEROL: 204 mg/dL — AB (ref 0–200)
HDL: 41 mg/dL (ref >40)
LDL Cholesterol: 138 mg/dL — ABNORMAL HIGH (ref 0–99)
TRIGLYCERIDES: 126 mg/dL (ref <150)
VLDL: 25 mg/dL (ref 0–40)

## 2016-08-22 LAB — CBC
HCT: 42.7 % (ref 39.0–52.0)
HEMOGLOBIN: 14.2 g/dL (ref 13.0–17.0)
MCH: 30.6 pg (ref 26.0–34.0)
MCHC: 33.3 g/dL (ref 30.0–36.0)
MCV: 92 fL (ref 78.0–100.0)
PLATELETS: 185 10*3/uL (ref 150–400)
RBC: 4.64 MIL/uL (ref 4.22–5.81)
RDW: 13.5 % (ref 11.5–15.5)
WBC: 5.6 10*3/uL (ref 4.0–10.5)

## 2016-08-22 MED ORDER — ACETAMINOPHEN 325 MG PO TABS
650.0000 mg | ORAL_TABLET | ORAL | Status: DC | PRN
  Administered 2016-08-22: 650 mg via ORAL
  Filled 2016-08-22: qty 2

## 2016-08-22 MED ORDER — LABETALOL HCL 200 MG PO TABS
300.0000 mg | ORAL_TABLET | Freq: Two times a day (BID) | ORAL | Status: DC
  Administered 2016-08-22: 300 mg via ORAL
  Filled 2016-08-22: qty 2

## 2016-08-22 MED ORDER — CLONIDINE HCL 0.1 MG PO TABS
0.1000 mg | ORAL_TABLET | Freq: Two times a day (BID) | ORAL | Status: DC
  Administered 2016-08-22: 0.1 mg via ORAL
  Filled 2016-08-22: qty 1

## 2016-08-22 MED ORDER — ASPIRIN EC 81 MG PO TBEC: 162.0000 mg | DELAYED_RELEASE_TABLET | Freq: Every day | ORAL | Status: AC

## 2016-08-22 MED ORDER — ASPIRIN 81 MG PO CHEW
162.0000 mg | CHEWABLE_TABLET | Freq: Every day | ORAL | Status: DC
  Administered 2016-08-22: 162 mg via ORAL
  Filled 2016-08-22: qty 2

## 2016-08-22 MED ORDER — ATORVASTATIN CALCIUM 10 MG PO TABS
10.0000 mg | ORAL_TABLET | Freq: Every day | ORAL | Status: DC
  Administered 2016-08-22: 10 mg via ORAL
  Filled 2016-08-22: qty 1

## 2016-08-22 MED ORDER — ATORVASTATIN CALCIUM 20 MG PO TABS: 20.0000 mg | ORAL_TABLET | Freq: Every day | ORAL | 6 refills | Status: DC

## 2016-08-22 MED ORDER — LABETALOL HCL 200 MG PO TABS: 200.0000 mg | ORAL_TABLET | Freq: Two times a day (BID) | ORAL | Status: DC

## 2016-08-22 NOTE — Care Management Note (Signed)
Case Management Note  Patient Details  Name: Alan Hawkins MRN: [id number] Date of Birth: 04/19/1941  Subjective/Objective:                  Pt admitted with CVA. Chart reviewed for CM needs. Pt is from home, ind with ADL's at baseline. Pt has PCP, transportation to appointments and no difficulty affording medications. Pt plans to return home with self care. PT has recommended no f/u.   Action/Plan: No CM needs.   Expected Discharge Date:    08/23/2016              Expected Discharge Plan:  Home/Self Care  In-House Referral:  NA  Discharge planning Services  CM Consult  Post Acute Care Choice:  NA Choice offered to:  NA  Status of Service:  Completed, signed off  Sherald Barge, RN 08/22/2016, 2:01 PM

## 2016-08-22 NOTE — Progress Notes (Signed)
OT Cancellation Note  Patient Details Name: Alan Hawkins MRN: [id number] DOB: 01/25/1941   Cancelled Treatment:     Reason evaluation not completed: Pt unavailable-Pt off floor for testing. Will attempt to see if later today if possible, tomorrow morning if not possible today.  [name], OTR/L  [phone] 08/22/2016, 8:49 AM

## 2016-08-22 NOTE — Care Management Important Message (Signed)
Important Message  Patient Details  Name: Alan Hawkins MRN: [id number] Date of Birth: September 25, 1941   Medicare Important Message Given:  Yes    Sherald Barge, RN 08/22/2016, 1:58 PM

## 2016-08-22 NOTE — Progress Notes (Signed)
*  PRELIMINARY RESULTS* Echocardiogram 2D Echocardiogram has been performed.  Samuel Germany 08/22/2016, 12:59 PM

## 2016-08-22 NOTE — Progress Notes (Signed)
Patient discharged home.  IV removed - WNL.  Reviewed DC instructions and medications, educated on stroke and prevention.  Verbalizes understanding.  Instructed to follow up with PCP in 1-2 weeks.  Assisted off unit in NAD.

## 2016-08-22 NOTE — Evaluation (Signed)
Occupational Therapy Evaluation Patient Details Name: Alan Hawkins MRN: [id number] DOB: 1941-06-11 Today's Date: 08/22/2016    History of Present Illness 75 y.o. male with medical history significant of hypertension well controlled and kidney transplant on immunosuppressors. Presented to the emergency department complaining of sudden onset of left-sided numbness and weakness that started today around 6:30 AM. Patient went to bed yesterday at 11:00 PM where he was feeling at baseline. Patient arrived to the ED at 10:30. He reports that his left leg was working and felt numbness. Also noted that he was slurring his speech and he felt a burning sensation on the right side of his face. His wife took his blood pressure was elevated over the 200's. In the emergency department he was taken for MRI which showed acute infarction within the right paramedian pons. Patient admitted for stroke workup.    Clinical Impression   Pt awake, alert, oriented x4 this afternoon, wife present for evaluation. Pt demonstrates BUE strength at 5/5 throughout, coordination and sensation are intact. Pt demonstrates independence in ADL performance and functional mobility. No further OT services required at this time as pt is at baseline functioning.     Follow Up Recommendations  No OT follow up    Equipment Recommendations  None recommended by OT       Precautions / Restrictions Precautions Precautions: None Restrictions Weight Bearing Restrictions: No      Mobility Bed Mobility Overal bed mobility: Independent                Transfers Overall transfer level: Independent Equipment used: None                       ADL Overall ADL's : Independent;At baseline                                             Vision Vision Assessment?: No apparent visual deficits          Pertinent Vitals/Pain Pain Assessment: No/denies pain     Hand Dominance Right    Extremity/Trunk Assessment Upper Extremity Assessment Upper Extremity Assessment: Overall WFL for tasks assessed   Lower Extremity Assessment Lower Extremity Assessment: Defer to PT evaluation   Cervical / Trunk Assessment Cervical / Trunk Assessment: Normal   Communication Communication Communication: No difficulties   Cognition Arousal/Alertness: Awake/alert Behavior During Therapy: WFL for tasks assessed/performed Overall Cognitive Status: Within Functional Limits for tasks assessed                        Exercises   Other Exercises Other Exercises: 5 x sit<>stand in 7.38 seconds without use of UE's.    Shoulder Instructions      Home Living Family/patient expects to be discharged to:: Private residence Living Arrangements: Spouse/significant other   Type of Home: House Home Access: Stairs to enter [employer] of Steps: 2 steps at the back, and 4-5 at the front.    Home Layout: One level     Bathroom Shower/Tub: Teacher, early years/pre: Standard     Home Equipment: None          Prior Functioning/Environment Level of Independence: Independent    ADL's / Homemaking Assistance Needed: Still driving, pt states he is a Psychologist, sport and exercise.  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              End of Session    Activity Tolerance: Patient tolerated treatment well Patient left: in bed;with call bell/phone within reach;with family/visitor present   Time: 2957-4734 OT Time Calculation (min): 13 min Charges:  OT General Charges $OT Visit: 1 Procedure OT Evaluation $OT Eval Low Complexity: 1 Procedure [name], OTR/L  [phone] 08/22/2016, 3:58 PM

## 2016-08-22 NOTE — Evaluation (Signed)
Physical Therapy Evaluation Patient Details Name: Alan Hawkins MRN: [id number] DOB: [birth year] Today's Date: 08/22/2016   History of Present Illness  75 y.o. male with medical history significant of hypertension well controlled and kidney transplant on immunosuppressors. Presented to the emergency department complaining of sudden onset of left-sided numbness and weakness that started today around 6:30 AM. Patient went to bed yesterday at 11:00 PM where he was feeling at baseline. Patient arrived to the ED at 10:30. He reports that his left leg was working and felt numbness. Also noted that he was slurring his speech and he felt a burning sensation on the right side of his face. His wife took his blood pressure was elevated over the 200's. In the emergency department he was taken for MRI which showed acute infarction within the right paramedian pons. Patient admitted for stroke workup.   Clinical Impression  Pt received in bed, and was agreeable to PT evaluation.  Pt expressed that he is normally independent with all functional mobility, ADL's, and IADL's.  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  During PT evaluation, he ambulated a total of 575ft independently, was able to negotiate 1 flight of steps at Mod (I) level.  He has no history of falls in the past 6 months, his gait speed is faster than the average for his age, and he was able to complete 5x sit<>stand in 7.38 seconds (norm is <12 seconds).  Pt does not demonstrate need for skilled PT at this time, therefore, will sign off.      Follow Up Recommendations No PT follow up    Equipment Recommendations  None recommended by PT    Recommendations for Other Services       Precautions / Restrictions Precautions Precautions: None Restrictions Weight Bearing Restrictions: No      Mobility  Bed Mobility Overal bed mobility: Independent                Transfers Overall transfer  level: Independent Equipment used: None                Ambulation/Gait Ambulation/Gait assistance: Independent Ambulation Distance (Feet): 500 Feet Assistive device: None Gait Pattern/deviations: WFL(Within Functional Limits) Gait velocity: 5.17ft/sec Gait velocity interpretation: at or above normal speed for age/gender General Gait Details: No overt gait deviations or LOB  Stairs Stairs: Yes Stairs assistance: Modified independent (Device/Increase time) Stair Management: One rail Right;Alternating pattern Number of Stairs: 10    Wheelchair Mobility    Modified Rankin (Stroke Patients Only)       Balance Overall balance assessment: Independent                                           Pertinent Vitals/Pain Pain Assessment: No/denies pain    Home Living   Living Arrangements: Spouse/significant other (wife is present most of the time. )   Type of Home: House Home Access: Stairs to enter   [employer] of Steps: 2 steps at the back, and 4-5 at the front.  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  Hand Dominance   Dominant Hand: Right    Extremity/Trunk Assessment   Upper Extremity Assessment: Overall WFL for tasks assessed           Lower Extremity Assessment: Overall WFL for tasks assessed         Communication   Communication: No difficulties  Cognition Arousal/Alertness: Awake/alert Behavior During Therapy: WFL for tasks assessed/performed Overall Cognitive Status: Within Functional Limits for tasks assessed                      General Comments      Exercises Other Exercises Other Exercises: 5 x sit<>stand in 7.38 seconds without use of UE's.    Assessment/Plan    PT  Assessment Patent does not need any further PT services  PT Problem List            PT Treatment Interventions      PT Goals (Current goals can be found in the Care Plan section)  Acute Rehab PT Goals PT Goal Formulation: All assessment and education complete, DC therapy    Frequency     Barriers to discharge        Co-evaluation               End of Session Equipment Utilized During Treatment: Gait belt Activity Tolerance: Patient tolerated treatment well Patient left: in chair;with call bell/phone within reach Nurse Communication: Mobility status Biochemist, clinical, RN aware of pt's mobility status. )    Functional Assessment Tool Used: [employer] "6-clicks"  Functional Limitation: Mobility: Walking and moving around Mobility: Walking and Moving Around Current Status [phone]): 0 percent impaired, limited or restricted Mobility: Walking and Moving Around Goal Status [phone]): 0 percent impaired, limited or restricted Mobility: Walking and Moving Around Discharge Status [phone]): 0 percent impaired, limited or restricted    Time: 5701-7793 PT Time Calculation (min) (ACUTE ONLY): 15 min   Charges:   PT Evaluation $PT Eval Low Complexity: 1 Procedure     PT G Codes:   PT G-Codes **NOT FOR INPATIENT CLASS** Functional Assessment Tool Used: [employer] "6-clicks"  Functional Limitation: Mobility: Walking and moving around Mobility: Walking and Moving Around Current Status [phone]): 0 percent impaired, limited or restricted Mobility: Walking and Moving Around Goal Status [phone]): 0 percent impaired, limited or restricted Mobility: Walking and Moving Around Discharge Status [phone]): 0 percent impaired, limited or restricted    Beth Merek Niu, PT, DPT X: [phone]

## 2016-08-22 NOTE — Progress Notes (Signed)
SLP Cancellation Note  Patient Details Name: Alan Hawkins MRN: [id number] DOB: 1941/09/10   Cancelled treatment:       Reason Eval/Treat Not Completed: SLP screened, no needs identified, will sign off; Pt was sitting up on edge of bed upon SLP arrival with family in room. He completed dinner meal and was getting ready to discharge home. Pt and family deny changes in Pt's cognition, speech, language, or swallowing skills. Pt will be seen by Dr. Maudie Mercury as his PCP going forward. No SLP services indicated at this time.  Thank you,  Genene Churn, [place] 08/22/2016, 5:32 PM

## 2016-08-22 NOTE — Discharge Summary (Signed)
Physician Discharge Summary  SHAYAN BRAMHALL XBD:[id number] DOB: 1941-03-23 DOA: 08/21/2016  PCP: Jani Gravel, MD  Admit date: 08/21/2016 Discharge date: 08/22/2016  Time spent: Greater than 30 minutes  Recommendations for Outpatient Follow-up:  1. Patient was instructed to not drive until he was cleared by his PCP. 2. Recommend checking blood level of tacrolimus in the next 2-3 months or sooner while the patient is treated with atorvastatin as it can increase the levels.    Discharge Diagnoses:  1. Acute right paramedian pons/lacunar infarction. --Left-sided weakness and dysarthria on admission resolved. 2. 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 3. Hyperlipidemia. 4. Ischemic cardiomyopathy. 5. AAA without rupture. 6. Essential hypertension. 7. 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 8. History of kidney transplant, on chronic prednisone immunosuppressants.   Discharge Condition: Improved  Diet recommendation: Heart healthy  Filed Weights   08/21/16 1023 08/21/16 1357  Weight: 81.6 kg (180 lb) 80.7 kg (178 lb)    History of present illness:  Patient is a 75 year old male with a history of HTN, AAA, ischemic cardiomyopathy, and kidney transplant on immunosuppressants, who presented to the ED on 08/21/2016 with a chief complaint of left-sided numbness and weakness and occasional slurred speech. In the ED, his blood pressure was in the 834H to 962I systolically. MRI of the brain revealed an acute infarction within the right paramedian pons. He was admitted for further evaluation and management.  Hospital Course:  The patient was restarted on aspirin. His antihypertensive medications were withheld initially allowing for permissive hypertension. Lipitor was started empirically. Otherwise, his other chronic medications were continued. For further evaluation, a number studies were ordered.  MRA of the brain revealed poor flow in the distal left vertebral artery and of the right PCA along with extensive intracranial atherosclerosis as well as moderate stenosis in both ICA siphons. Carotid ultrasound revealed less than 50% stenosis. His echo revealed an EF of 50-55% and grade 1 diastolic dysfunction. His lipid panel revealed a total cholesterol of 204, triglycerides 126, HDL of 41, and LDL of 138.  The dose of aspirin was increased to 162 mg given the extensiveness of his intracranial atherosclerosis. The dose of Lipitor was decreased to 20 mg due to the potential drug drug interaction with tacrolimus. He was evaluated by both the physical therapist and occupational therapist and both did not recommend any therapy as his deficits resolved. Patient, surprisingly, improved rather quickly. He was instructed not to drive for at least 2 weeks or when he was cleared by his PCP, Dr. Maudie Mercury. His antihypertensive medications were restarted due to accelerated blood pressures developed off of his antihypertensive medications. Would recommend rechecking his blood pressure and to try to achieve close to normotensive as possible in the outpatient setting.  Per pharmacy, tacrolimus and Lipitor have drug drug interactions as both can increase the levels of each. Therefore, it was recommended that tacrolimus levels be rechecked in the next few weeks to a couple of months. All of his other chronic medical conditions remained stable.    Procedures: 2-D echo on 08/22/2016: Study Conclusions - Left ventricle: The cavity size was mildly dilated. Wall   thickness was increased in a pattern of mild LVH. Systolic   function was normal. The estimated ejection fraction was in the   range of 55% to 60%. Doppler parameters are consistent with   abnormal left ventricular relaxation (grade 1 diastolic   dysfunction). - Aortic valve:  There was moderate regurgitation. - Mitral valve: There was mild regurgitation. - Left  atrium: The atrium was moderately dilated. - Right atrium: The atrium was mildly dilated. - Pulmonary arteries: PA peak pressure: 31 mm Hg (S). - Pericardium, extracardiac: A trivial pericardial effusion was   identified.  Consultations:  None  Discharge Exam: Vitals:   08/22/16 1000 08/22/16 1400  BP: (!) 152/69 (!) 158/69  Pulse: 75 63  Resp: 18 18  Temp: 97.7 F (36.5 C) 97.2 F (36.2 C)    General: Pleasant alert 75 year old man in no acute distress. Cardiovascular: S1, S2, with a soft systolic murmur. Respiratory: Clear to auscultation bilaterally. Neurologic: He is alert and oriented 3. Faint left facial droop. Strength of the bilateral upper extremities is 5 over 5. Strength of the bilateral lower extremities is 5 over 5. No pronator drift. Cerebellar with finger to nose intact.  Discharge Instructions   Discharge Instructions    Diet - low sodium heart healthy    Complete by:  As directed    Discharge instructions    Complete by:  As directed    ASK YOUR KIDNEY DOCTOR TO CHECK YOUR TACROLIMUS LEVEL IN 2-3 MONTHS WHILE ON THE NEW CHOLESTEROL MEDICATION.  DO NOT DRIVE FOR AT LEAST 2 WEEKS OR UNTIL YOU ARE CLEARED BY YOUR DOCTOR.   Driving Restrictions    Complete by:  As directed    DO NOT DRIVE FOR AT LEAST 2 WEEKS OR UNTIL YOU ARE CLEARED BY YOUR DOCTOR.   Increase activity slowly    Complete by:  As directed      Current Discharge Medication List    START taking these medications   Details  atorvastatin (LIPITOR) 20 MG tablet Take 1 tablet (20 mg total) by mouth daily at 6 PM. Qty: 30 tablet, Refills: 6      CONTINUE these medications which have CHANGED   Details  aspirin EC 81 MG tablet Take 2 tablets (162 mg total) by mouth daily.      CONTINUE these medications which have NOT CHANGED   Details  acetaminophen (TYLENOL) 500 MG tablet Take 500-1,000 mg by mouth every 6 (six) hours as needed for moderate pain or headache.    amLODipine (NORVASC) 10  MG tablet Take 10 mg by mouth daily.     calcitRIOL (ROCALTROL) 0.25 MCG capsule Take 0.25 mcg by mouth every other day.    cetirizine (ZYRTEC) 10 MG tablet Take 10 mg by mouth at bedtime.    cloNIDine (CATAPRES) 0.1 MG tablet Take 0.1 mg by mouth 2 (two) times daily.    Co-Enzyme Q-10 30 MG CAPS Take 30 mg by mouth daily.     docusate sodium (COLACE) 100 MG capsule Take 100 mg by mouth 2 (two) times daily.    fluticasone (FLONASE) 50 MCG/ACT nasal spray Place 1 spray into both nostrils daily as needed.     labetalol (NORMODYNE) 300 MG tablet Take 300 mg by mouth 2 (two) times daily.    mycophenolate (MYFORTIC) 180 MG EC tablet Take 180 mg by mouth 2 (two) times daily.    Polyethyl Glycol-Propyl Glycol (SYSTANE OP) Apply 1 drop to eye daily as needed (dry eyes).     polyethylene glycol (MIRALAX) packet Take 17 g by mouth daily. Take 17 g by mouth daily as needed (Constipation).    predniSONE (DELTASONE) 5 MG tablet Take 5 mg by mouth daily with breakfast.    ranitidine (ZANTAC) 150 MG tablet Take 150 mg by mouth  2 (two) times daily.     tacrolimus (PROGRAF) 0.5 MG capsule Take 3 mg by mouth 2 (two) times daily.    tamsulosin (FLOMAX) 0.4 MG CAPS capsule Take 1 capsule by mouth at bedtime.       Allergies  Allergen Reactions  . Bee Venom Anaphylaxis  . Nitrofuran Derivatives Nausea And Vomiting    Nausea and vomiting , sever headache while on nitroglycerin infusion twice  . Nitroglycerin Other (See Comments)    Headache   nausea Headache   nausea  . Tape Itching and Rash   Follow-up Information    Jani Gravel, MD. Schedule an appointment as soon as possible for a visit in 1 week(s).   Specialty:  Internal Medicine Why:  TO BE SEEN IN 1 WEEK. CALL FOR THE APPOINTMENT. Contact information: [address] [place] [address] [phone]            The results of significant diagnostics from this hospitalization (including imaging, microbiology, ancillary and  laboratory) are listed below for reference.    Significant Diagnostic Studies: Mr Brain 55 Contrast  Result Date: 08/21/2016 CLINICAL DATA:  Sudden onset of left-sided weakness beginning today. EXAM: MRI HEAD WITHOUT CONTRAST TECHNIQUE: Multiplanar, multiecho pulse sequences of the brain and surrounding structures were obtained without intravenous contrast. COMPARISON:  None. FINDINGS: Brain: There is acute infarction within the right para median pons. No swelling or hemorrhage. No other acute infarction. Cerebral hemispheres show moderate chronic small-vessel ischemic changes affecting the deep and subcortical white matter. No large vessel territory infarction. No mass lesion, hemorrhage, hydrocephalus or extra-axial collection. No pituitary mass. Vascular: Major vessels at the base of the brain show flow. Skull and upper cervical spine: Negative Sinuses/Orbits: Clear/normal Other: None IMPRESSION: Acute infarction within the right para median pons. No swelling or hemorrhage. Chronic small-vessel ischemic changes elsewhere throughout the brain. Major vessels at the base of the brain show flow. Electronically Signed   By: Nelson Chimes M.D.   On: 08/21/2016 12:01   US Carotid Bilateral  Result Date: 08/22/2016 CLINICAL DATA:  TIA a.m. Hypertension, coronary artery disease, diabetes, aortic aneurysm. EXAM: BILATERAL CAROTID DUPLEX ULTRASOUND TECHNIQUE: Pearline Cables scale imaging, color Doppler and duplex ultrasound was performed of bilateral carotid and vertebral arteries in the neck. COMPARISON:  MRI 08/22/2016 TECHNIQUE: Quantification of carotid stenosis is based on velocity parameters that correlate the residual internal carotid diameter with NASCET-based stenosis levels, using the diameter of the distal internal carotid lumen as the denominator for stenosis measurement. The following velocity measurements were obtained: PEAK SYSTOLIC/END DIASTOLIC RIGHT ICA:                     70/12cm/sec CCA:                      20/94BS/JGG SYSTOLIC ICA/CCA RATIO:  0.8 DIASTOLIC ICA/CCA RATIO: 1.2 ECA:                     111cm/sec LEFT ICA:                     89/15cm/sec CCA:                     83/6OQ/HUT SYSTOLIC ICA/CCA RATIO:  1.1 DIASTOLIC ICA/CCA RATIO: 1.6 ECA:                     82cm/sec FINDINGS: RIGHT CAROTID ARTERY: Irregular eccentric partially calcified  plaque in the bulb extending into proximal ICA. No high-grade stenosis. Normal waveforms and color Doppler signal. A nonspecific cardiac arrhythmias noted. RIGHT VERTEBRAL ARTERY:  Normal flow direction and waveform. LEFT CAROTID ARTERY: Eccentric nonocclusive partially calcified plaque in the mid common carotid artery, bulb, and proximal ICA. No high-grade stenosis. Normal waveforms and color Doppler signal. LEFT VERTEBRAL ARTERY: Normal flow direction and waveform. IMPRESSION: 1. Bilateral carotid bifurcation and proximal ICA plaque resulting in less than 50% diameter stenosis. The exam does not exclude plaque ulceration or embolization. Continued surveillance recommended. 2.  Antegrade bilateral vertebral arterial flow. Electronically Signed   By: Lucrezia Europe M.D.   On: 08/22/2016 09:56   Mr Jodene Nam Head/brain YT Cm  Addendum Date: 08/22/2016   ADDENDUM REPORT: 08/22/2016 09:11 ADDENDUM: Study discussed by telephone with Dr. Rexene Alberts on 08/22/2016 At 0905 hours. Electronically Signed   By: Genevie Ann M.D.   On: 08/22/2016 09:11   Result Date: 08/22/2016 CLINICAL DATA:  75 year old male with sudden onset left side weakness. Right paracentral pontine lacunar infarct detected on MRI yesterday. Initial encounter. EXAM: MRA HEAD WITHOUT CONTRAST TECHNIQUE: Angiographic images of the Circle of Willis were obtained using MRA technique without intravenous contrast. COMPARISON:  Brain MRI 08/21/2016 FINDINGS: Poor flow signal in the left vertebral artery V4 segment. Normal flow signal in the distal right vertebral artery and right PICA. The left PICA appears to remain  patent and may be supplied in a retrograde fashion from the vertebrobasilar junction. Patent basilar artery with moderate irregularity and mild to moderate mid basilar stenosis (series 110, image 16). SCA an the left PCA origins are patent and within normal limits. There is mild left PCA branch irregularity and stenosis. There is a fetal type right PCA origin with moderate to severe irregularity and decreased flow throughout the right PCA. There is no mass effect evident in the right occipital lobe on the source MRA images. Antegrade flow in both ICA siphons with up to moderate bilateral supraclinoid irregularity and stenosis. Normal ophthalmic artery origins. The right posterior communicating artery origin is patent but with mildly to moderately decreased flow signal. Both carotid termini remain patent. The right ACA A1 segment is diminutive or absent. The left ACA origin is normal. The anterior communicating artery is normal. There is up to moderate distal bilateral ACA branch irregularity and stenosis. Both MCA origins and M1 segments are within normal limits. Both MCA bifurcations are patent. There is up to moderate bilateral MCA branch irregularity and stenosis, perhaps greater on the right. IMPRESSION: 1. Poor flow in the distal left vertebral artery and of the right PCA, but absence of acute ischemia in these vascular territories on yesterday MRI argues against emergent large vessel occlusions and in favor of severe atherosclerotic disease. 2. Extensive intracranial atherosclerosis. Up to moderate stenosis also in the mid basilar artery, both ICA siphons, and bilateral second order and distal MCA and ACA branches. Electronically Signed: By: Genevie Ann M.D. On: 08/22/2016 08:51    Microbiology: Recent Results (from the past 240 hour(s))  MRSA PCR Screening     Status: None   Collection Time: 08/21/16  2:52 PM  Result Value Ref Range Status   MRSA by PCR NEGATIVE NEGATIVE Final    Comment:        The  GeneXpert MRSA Assay (FDA approved for NASAL specimens only), is one component of a comprehensive MRSA colonization surveillance program. It is not intended to diagnose MRSA infection nor to guide or monitor treatment  for MRSA infections.      Labs: Basic Metabolic Panel:  Recent Labs Lab 08/21/16 1038 08/21/16 1051 08/22/16 0703  NA 135 138 140  K 4.1 4.3 4.0  CL 103 102 108  CO2 25  --  25  GLUCOSE 124* 123* 101*  BUN 24* 25* 21*  CREATININE 1.91* 1.90* 1.66*  CALCIUM 9.9  --  9.6   Liver Function Tests:  Recent Labs Lab 08/21/16 1038  AST 17  ALT 12*  ALKPHOS 55  BILITOT 0.7  PROT 6.7  ALBUMIN 3.9   No results for input(s): LIPASE, AMYLASE in the last 168 hours. No results for input(s): AMMONIA in the last 168 hours. CBC:  Recent Labs Lab 08/21/16 1038 08/21/16 1051 08/22/16 0703  WBC 7.8  --  5.6  NEUTROABS 5.2  --   --   HGB 13.9 14.3 14.2  HCT 42.0 42.0 42.7  MCV 92.3  --  92.0  PLT 207  --  185   Cardiac Enzymes: No results for input(s): CKTOTAL, CKMB, CKMBINDEX, TROPONINI in the last 168 hours. BNP: BNP (last 3 results) No results for input(s): BNP in the last 8760 hours.  ProBNP (last 3 results) No results for input(s): PROBNP in the last 8760 hours.  CBG: No results for input(s): GLUCAP in the last 168 hours.     Signed:  Aislinn Feliz MD.  [facility] 08/22/2016, 4:57 PM

## 2016-08-22 NOTE — Progress Notes (Signed)
SLP Cancellation Note  Patient Details Name: Alan Hawkins MRN: [id number] DOB: 1941-09-16   Cancelled treatment:       Reason Eval/Treat Not Completed: Patient at procedure or test/unavailable; SLP will check back later.  Thank you,  Genene Churn, [place]    [place] 08/22/2016, 12:53 PM

## 2016-08-23 LAB — HEMOGLOBIN A1C
Hgb A1c MFr Bld: 5.7 % — ABNORMAL HIGH (ref 4.8–5.6)
MEAN PLASMA GLUCOSE: 117 mg/dL

## 2017-03-17 ENCOUNTER — Emergency Department (HOSPITAL_COMMUNITY)
Admission: EM | Admit: 2017-03-17 | Discharge: 2017-03-17 | Disposition: A | Discharge status: Home / Self Care | Payer: Medicare Other | Attending: Emergency Medicine | Admitting: Emergency Medicine

## 2017-03-17 ENCOUNTER — Emergency Department (HOSPITAL_COMMUNITY): Payer: Medicare Other

## 2017-03-17 ENCOUNTER — Encounter (HOSPITAL_COMMUNITY): Payer: Self-pay | Admitting: Emergency Medicine

## 2017-03-17 DIAGNOSIS — I130 Hypertensive heart and chronic kidney disease with heart failure and stage 1 through stage 4 chronic kidney disease, or unspecified chronic kidney disease: Secondary | ICD-10-CM | POA: Diagnosis not present

## 2017-03-17 DIAGNOSIS — N184 Chronic kidney disease, stage 4 (severe): Secondary | ICD-10-CM | POA: Insufficient documentation

## 2017-03-17 DIAGNOSIS — I509 Heart failure, unspecified: Secondary | ICD-10-CM | POA: Diagnosis not present

## 2017-03-17 DIAGNOSIS — S62636A Displaced fracture of distal phalanx of right little finger, initial encounter for closed fracture: Secondary | ICD-10-CM | POA: Diagnosis not present

## 2017-03-17 DIAGNOSIS — Z940 Kidney transplant status: Secondary | ICD-10-CM | POA: Diagnosis not present

## 2017-03-17 DIAGNOSIS — Z87891 Personal history of nicotine dependence: Secondary | ICD-10-CM | POA: Insufficient documentation

## 2017-03-17 DIAGNOSIS — S67196A Crushing injury of right little finger, initial encounter: Secondary | ICD-10-CM | POA: Diagnosis not present

## 2017-03-17 DIAGNOSIS — W228XXA Striking against or struck by other objects, initial encounter: Secondary | ICD-10-CM | POA: Insufficient documentation

## 2017-03-17 DIAGNOSIS — Z7982 Long term (current) use of aspirin: Secondary | ICD-10-CM | POA: Diagnosis not present

## 2017-03-17 DIAGNOSIS — Z23 Encounter for immunization: Secondary | ICD-10-CM | POA: Insufficient documentation

## 2017-03-17 DIAGNOSIS — Y929 Unspecified place or not applicable: Secondary | ICD-10-CM | POA: Diagnosis not present

## 2017-03-17 DIAGNOSIS — S6991XA Unspecified injury of right wrist, hand and finger(s), initial encounter: Secondary | ICD-10-CM | POA: Diagnosis present

## 2017-03-17 DIAGNOSIS — S6710XA Crushing injury of unspecified finger(s), initial encounter: Secondary | ICD-10-CM

## 2017-03-17 DIAGNOSIS — Y939 Activity, unspecified: Secondary | ICD-10-CM | POA: Diagnosis not present

## 2017-03-17 DIAGNOSIS — Z79899 Other long term (current) drug therapy: Secondary | ICD-10-CM | POA: Diagnosis not present

## 2017-03-17 DIAGNOSIS — Y999 Unspecified external cause status: Secondary | ICD-10-CM | POA: Insufficient documentation

## 2017-03-17 MED ORDER — TETANUS-DIPHTH-ACELL PERTUSSIS 5-2.5-18.5 LF-MCG/0.5 IM SUSP
0.5000 mL | Freq: Once | INTRAMUSCULAR | Status: AC
  Administered 2017-03-17: 0.5 mL via INTRAMUSCULAR
  Filled 2017-03-17: qty 0.5

## 2017-03-17 MED ORDER — CEPHALEXIN 500 MG PO CAPS
500.0000 mg | ORAL_CAPSULE | Freq: Once | ORAL | Status: AC
  Administered 2017-03-17: 500 mg via ORAL
  Filled 2017-03-17: qty 1

## 2017-03-17 MED ORDER — CEPHALEXIN 500 MG PO CAPS: 500.0000 mg | ORAL_CAPSULE | Freq: Three times a day (TID) | ORAL | 0 refills | Status: AC

## 2017-03-17 MED ORDER — LIDOCAINE HCL (PF) 2 % IJ SOLN
10.0000 mL | Freq: Once | INTRAMUSCULAR | Status: AC
  Administered 2017-03-17: 10 mL
  Filled 2017-03-17: qty 10

## 2017-03-17 MED ORDER — POVIDONE-IODINE 10 % EX SOLN
CUTANEOUS | Status: AC
  Filled 2017-03-17: qty 118

## 2017-03-17 NOTE — ED Provider Notes (Signed)
Crush finger to R small finger yesterday Presents with pain and ongoing bleeding today Mild pain to tip of finger On exam has macerated distal finger with complete subungual hematoma and sig swelling - unable to approximate skin from lac's -  Has small phal tip frax Pt informed TDAP confirmed Keflex for prophylaxis - irrigated with copious saline Will d/w hand surgery for f/u Splint, proph abx  Medical screening examination/treatment/procedure(s) were conducted as a shared visit with non-physician practitioner(s) and myself.  I personally evaluated the patient during the encounter.  Clinical Impression:   Final diagnoses:  Crushing injury of finger, initial encounter  Closed displaced fracture of distal phalanx of right little finger, initial encounter         Noemi Chapel, MD 03/18/17 [phone]

## 2017-03-17 NOTE — ED Provider Notes (Signed)
[facility] DEPT Provider Note   CSN: [id number] Arrival date & time: 03/17/17  0841     History   Chief Complaint Chief Complaint  Patient presents with  . Finger Injury    HPI Alan Hawkins is a 76 y.o. male presenting with injury to the right pinky.  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 He cleaned the wound, and tried to stop the bleeding. He reports there is continued bleeding today, so he came into the ED for this. He reports minimal to no pain. He denies injury elsewhere. He has full range of motion and full sensation of the pinky and every other finger. He does not know when his last tetanus shot was. He denies fevers, chill, encroaching redness, rash, or purulent drainage from the wound.  Additionally, patient has a kidney transplant, and is on immunosuppression.  HPI  Past Medical History:  Diagnosis Date  . AAA (abdominal aortic aneurysm) ([place])   . Acute lacunar stroke ([place]) 08/22/2016  . Anemia    iron infusions with dialysis occasionally  . Arthritis   . Cancer ([place]) [phone]   basal cell carcinoma right ear  . Cerebrovascular disease 08/22/2016  . CHF (congestive heart failure) ([place])   . CKD (chronic kidney disease) stage 3, GFR 30-59 ml/min    hd mwf - started jan 2015  . Essential hypertension, benign   . GERD (gastroesophageal reflux disease)   . Heart murmur   . Hyperlipidemia     Patient Active Problem List   Diagnosis Date Noted  . Acute lacunar stroke ([place]) 08/22/2016  . Essential hypertension 08/22/2016  . Cerebrovascular disease 08/22/2016  . Chronic kidney disease (CKD), stage IV (severe) ([place]) 08/22/2016  . Current chronic use of systemic steroids 08/22/2016  . Hyperlipidemia 08/22/2016  . Abdominal aortic aneurysm without rupture ([place]) 08/04/2014  . AAA (abdominal aortic aneurysm) without rupture ([place]) 07/15/2014  . Ischemic cardiomyopathy 09/20/2013  . Acute on  chronic systolic heart failure ([place]) 09/20/2013  . Acute on chronic renal failure ([place]) 09/20/2013    Past Surgical History:  Procedure Laterality Date  . ABDOMINAL AORTIC ENDOVASCULAR STENT GRAFT Bilateral 08/04/2014   Procedure: ABDOMINAL AORTIC ENDOVASCULAR STENT GRAFT;  Surgeon: Rosetta Posner, MD;  Location: [facility];  Service: Vascular;  Laterality: Bilateral;  . AV FISTULA PLACEMENT Left   . CATARACT EXTRACTION W/PHACO  11/17/2011   Procedure: CATARACT EXTRACTION PHACO AND INTRAOCULAR LENS PLACEMENT (IOC);  Surgeon: Tonny Branch, MD;  Location: [facility];  Service: Ophthalmology;  Laterality: Right;  CDE=16.17  . CATARACT EXTRACTION W/PHACO  12/05/2011   Procedure: CATARACT EXTRACTION PHACO AND INTRAOCULAR LENS PLACEMENT (IOC);  Surgeon: Tonny Branch, MD;  Location: [facility];  Service: Ophthalmology;  Laterality: Left;  CDE 16.22  . COLONOSCOPY N/A 05/20/2014   Procedure: COLONOSCOPY;  Surgeon: Rogene Houston, MD;  [facility];  Service: Endoscopy;  Laterality: N/A;  730  . excision of basal cell carcinoma right ear Right 05-2014  . EYE SURGERY     R KPE w/ IOL  . INSERTION OF DIALYSIS CATHETER Right 10/15/2013   removed  . KIDNEY TRANSPLANT Right 01-08-2015   WAKE [facility]  . VASECTOMY         Home Medications    Prior to Admission medications   Medication Sig Start Date End Date Taking? Authorizing Provider  acetaminophen (TYLENOL) 500 MG tablet Take 500-1,000 mg by mouth every 6 (six)  hours as needed for moderate pain or headache.   Yes [provider]  amLODipine (NORVASC) 10 MG tablet Take 10 mg by mouth daily.    Yes [provider]  aspirin EC 81 MG tablet Take 2 tablets (162 mg total) by mouth daily. 08/22/16  Yes Rexene Alberts, MD  atorvastatin (LIPITOR) 20 MG tablet Take 1 tablet (20 mg total) by mouth daily at 6 PM. 08/23/16  Yes Rexene Alberts, MD  calcitRIOL (ROCALTROL) 0.25 MCG capsule Take 0.25 mcg by mouth every other day.   Yes [provider]  cetirizine (ZYRTEC) 10 MG tablet Take 10 mg by mouth at bedtime.   Yes [provider]  cloNIDine (CATAPRES) 0.1 MG tablet Take 0.1 mg by mouth 2 (two) times daily.   Yes [provider]  Co-Enzyme Q-10 30 MG CAPS Take 30 mg by mouth daily.    Yes [provider]  docusate sodium (COLACE) 100 MG capsule Take 100 mg by mouth 2 (two) times daily.   Yes [provider]  fluticasone (FLONASE) 50 MCG/ACT nasal spray Place 1 spray into both nostrils daily as needed.    Yes [provider]  labetalol (NORMODYNE) 300 MG tablet Take 300 mg by mouth 2 (two) times daily.   Yes [provider]  mycophenolate (MYFORTIC) 180 MG EC tablet Take 180 mg by mouth 2 (two) times daily.   Yes [provider]  Polyethyl Glycol-Propyl Glycol (SYSTANE OP) Apply 1 drop to eye daily as needed (dry eyes).    Yes [provider]  polyethylene glycol (MIRALAX) packet Take 17 g by mouth daily. Take 17 g by mouth daily as needed (Constipation). 11/19/13  Yes [provider]  predniSONE (DELTASONE) 5 MG tablet Take 5 mg by mouth daily with breakfast.   Yes [provider]  ranitidine (ZANTAC) 150 MG tablet Take 150 mg by mouth 2 (two) times daily.    Yes [provider]  tacrolimus (PROGRAF) 0.5 MG capsule Take 3 mg by mouth 2 (two) times daily.   Yes [provider]  tamsulosin (FLOMAX) 0.4 MG CAPS capsule Take 1 capsule by mouth at bedtime. 08/21/16  Yes [provider]  cephALEXin (KEFLEX) 500 MG capsule Take 1 capsule (500 mg total) by mouth 3 (three) times daily. 03/17/17 03/27/17  Alexas Basulto, PA-C    Family History Family History  Problem Relation Age of Onset  . Heart disease Mother   . Cancer Mother   . Heart disease Sister   . Heart attack Sister   . Stroke Father   . Hypertension Son     Social History Social History  Substance Use Topics  . Smoking status: Former Smoker     Types: Cigarettes  . Smokeless tobacco: Never Used     Comment: "Stopped smoking at least 35-40 years ago"  . Alcohol use No     Comment: Occasional monthly beer     Allergies   Bee venom; Nitrofuran derivatives; Nitroglycerin; and Tape   Review of Systems Review of Systems  Constitutional: Negative for chills and fever.  Musculoskeletal: Positive for joint swelling (L pinky).  Skin: Positive for wound (L pinky).  Neurological: Negative for numbness.     Physical Exam Updated Vital Signs BP 131/76 (BP Location: Left Arm)   Pulse 64   Temp 97 F (36.1 C) (Oral)   Resp 18   Ht 5\' 9"  (1.753 m)   Wt 80.7 kg (178 lb)   SpO2 99%  BMI 26.29 kg/m   Physical Exam  Constitutional: He appears well-developed and well-nourished.  HENT:  Head: Normocephalic and atraumatic.  Neck: Normal range of motion.  Cardiovascular: Normal rate, regular rhythm, normal heart sounds and intact distal pulses.   Pulmonary/Chest: Effort normal and breath sounds normal.  Musculoskeletal:  FROM of R wrist, fingers, and pinky. Minimal TTP to distal phalange.   Neurological:  Normal sensation of R pinky finger.   Skin: No bruising and no laceration noted.  Crush injury of distal R pinky. No drainage. No surrounding erythema. No streaking.  Contusion under the entire nail bed.           ED Treatments / Results  Labs (all labs ordered are listed, but only abnormal results are displayed) Labs Reviewed - No data to display  EKG  EKG Interpretation None       Radiology Dg Hand Complete Right  Result Date: 03/17/2017 CLINICAL DATA:  Laceration on pinkie finger, crush injury EXAM: RIGHT HAND - COMPLETE 3+ VIEW COMPARISON:  None. FINDINGS: Displaced fracture through the tip of the right little finger distal phalanx. No additional fracture. No subluxation or dislocation. IMPRESSION: Displaced tuft fracture of the right little finger distal phalanx. Electronically Signed   By: Rolm Baptise  M.D.   On: 03/17/2017 09:41    Procedures Irrigation Date/Time: 03/17/2017 10:00 AM Performed by: Franchot Heidelberg Authorized by: Franchot Heidelberg  Consent: Verbal consent obtained. Risks and benefits: risks, benefits and alternatives were discussed Consent given by: patient Preparation: Patient was prepped and draped in the usual sterile fashion. Local anesthesia used: yes Anesthesia: local infiltration  Anesthesia: Local anesthesia used: yes Local Anesthetic: lidocaine 2% without epinephrine Anesthetic total: 5 mL  Sedation: Patient sedated: no Patient tolerance: Patient tolerated the procedure well with no immediate complications Comments: Digital nerve block. Wound cleansed with betadine and irrigated with normal saline.     (including critical care time)  Medications Ordered in ED Medications  povidone-iodine (BETADINE) 10 % external solution (not administered)  Tdap (BOOSTRIX) injection 0.5 mL (0.5 mLs Intramuscular Given 03/17/17 0939)  lidocaine (XYLOCAINE) 2 % injection 10 mL (10 mLs Infiltration Given by Other 03/17/17 1135)  cephALEXin (KEFLEX) capsule 500 mg (500 mg Oral Given 03/17/17 1135)     Initial Impression / Assessment and Plan / ED Course  I have reviewed the triage vital signs and the nursing notes.  Pertinent labs & imaging results that were available during my care of the patient were reviewed by me and considered in my medical decision making (see chart for details).     Initial assessment shows patient with crush injury to right pinky with minimal bleeding at this time. Digital block performed, and wound was cleaned with Betadine and irrigated with normal saline. Hand surgery (Dr. Burney Gauze) consulted, and recommends patient contact his office for follow-up Monday afternoon if they've not yet heard from his office. Wound could not be sutured/closed, at this time due to swelling and nature of injury. Bandage and splint applied. Patient instructed on wound  care. Patient states he does not want any prescriptions for pain medicines, as he will not take them. Will prescribe prophylactic antibiotics due to patient's immunosuppression. Return precautions given. Patient states he understands and agrees to plan.  Final Clinical Impressions(s) / ED Diagnoses   Final diagnoses:  Crushing injury of finger, initial encounter  Closed displaced fracture of distal phalanx of right little finger, initial encounter    New Prescriptions Discharge Medication List as of 03/17/2017  11:44 AM    START taking these medications   Details  cephALEXin (KEFLEX) 500 MG capsule Take 1 capsule (500 mg total) by mouth 3 (three) times daily., Starting Fri 03/17/2017, Until Mon 03/27/2017, Print         Caruthers, Rhyann Berton, PA-C 03/17/17 1242    Noemi Chapel, MD 03/18/17 [phone]

## 2017-03-17 NOTE — Discharge Instructions (Signed)
Keep wound clean and dry. Use ice and anti-inflammatories to help manage pain. Wear finger splint to decrease finger movement. Take antibiotic as prescribed. Follow-up with Dr. Burney Gauze. Call his office on Monday afternoon to schedule an appointment if you have not yet heard from his office. Return to the emergency department if he starts experiencing fever, chills, pus draining from the injury, or redness moving down the finger into the hand.

## 2017-03-17 NOTE — ED Notes (Addendum)
Patient's finger soaking in sterile saline and betadine.

## 2017-03-17 NOTE — ED Triage Notes (Signed)
Pt states he got his right pinkie finger caught in the tractor hitch yesterday.  Laceration with controlled bleeding.

## 2017-03-17 NOTE — ED Notes (Signed)
Patient is resting comfortably. 

## 2017-08-09 ENCOUNTER — Other Ambulatory Visit (HOSPITAL_COMMUNITY): Payer: Self-pay | Admitting: Family Medicine

## 2017-08-09 DIAGNOSIS — Z13820 Encounter for screening for osteoporosis: Secondary | ICD-10-CM

## 2017-08-16 ENCOUNTER — Ambulatory Visit (HOSPITAL_COMMUNITY)
Admission: RE | Admit: 2017-08-16 | Discharge: 2017-08-16 | Disposition: A | Discharge status: Home / Self Care | Payer: Medicare Other | Source: Ambulatory Visit | Attending: Family Medicine | Admitting: Family Medicine

## 2017-08-16 DIAGNOSIS — Z940 Kidney transplant status: Secondary | ICD-10-CM | POA: Diagnosis not present

## 2017-08-16 DIAGNOSIS — M85831 Other specified disorders of bone density and structure, right forearm: Secondary | ICD-10-CM | POA: Insufficient documentation

## 2017-08-16 DIAGNOSIS — Z7952 Long term (current) use of systemic steroids: Secondary | ICD-10-CM | POA: Insufficient documentation

## 2017-08-16 DIAGNOSIS — Z13820 Encounter for screening for osteoporosis: Secondary | ICD-10-CM | POA: Diagnosis not present

## 2017-08-16 DIAGNOSIS — M85851 Other specified disorders of bone density and structure, right thigh: Secondary | ICD-10-CM | POA: Diagnosis not present

## 2017-09-07 ENCOUNTER — Ambulatory Visit (INDEPENDENT_AMBULATORY_CARE_PROVIDER_SITE_OTHER): Payer: Medicare Other | Admitting: Otolaryngology

## 2017-09-07 DIAGNOSIS — H6123 Impacted cerumen, bilateral: Secondary | ICD-10-CM

## 2017-09-07 DIAGNOSIS — H903 Sensorineural hearing loss, bilateral: Secondary | ICD-10-CM

## 2018-06-18 DIAGNOSIS — Z940 Kidney transplant status: Secondary | ICD-10-CM | POA: Diagnosis not present

## 2018-06-18 DIAGNOSIS — N189 Chronic kidney disease, unspecified: Secondary | ICD-10-CM | POA: Diagnosis not present

## 2018-07-13 DIAGNOSIS — Z940 Kidney transplant status: Secondary | ICD-10-CM | POA: Diagnosis not present

## 2018-07-13 DIAGNOSIS — D631 Anemia in chronic kidney disease: Secondary | ICD-10-CM | POA: Diagnosis not present

## 2018-07-13 DIAGNOSIS — N183 Chronic kidney disease, stage 3 (moderate): Secondary | ICD-10-CM | POA: Diagnosis not present

## 2018-07-13 DIAGNOSIS — N2581 Secondary hyperparathyroidism of renal origin: Secondary | ICD-10-CM | POA: Diagnosis not present

## 2018-07-13 DIAGNOSIS — I129 Hypertensive chronic kidney disease with stage 1 through stage 4 chronic kidney disease, or unspecified chronic kidney disease: Secondary | ICD-10-CM | POA: Diagnosis not present

## 2018-07-16 DIAGNOSIS — D0439 Carcinoma in situ of skin of other parts of face: Secondary | ICD-10-CM | POA: Diagnosis not present

## 2018-07-16 DIAGNOSIS — D485 Neoplasm of uncertain behavior of skin: Secondary | ICD-10-CM | POA: Diagnosis not present

## 2018-07-16 DIAGNOSIS — Z85828 Personal history of other malignant neoplasm of skin: Secondary | ICD-10-CM | POA: Diagnosis not present

## 2018-07-16 DIAGNOSIS — C44319 Basal cell carcinoma of skin of other parts of face: Secondary | ICD-10-CM | POA: Diagnosis not present

## 2018-07-16 DIAGNOSIS — L821 Other seborrheic keratosis: Secondary | ICD-10-CM | POA: Diagnosis not present

## 2018-07-16 DIAGNOSIS — L570 Actinic keratosis: Secondary | ICD-10-CM | POA: Diagnosis not present

## 2018-08-02 DIAGNOSIS — C44329 Squamous cell carcinoma of skin of other parts of face: Secondary | ICD-10-CM | POA: Diagnosis not present

## 2018-08-02 DIAGNOSIS — C44311 Basal cell carcinoma of skin of nose: Secondary | ICD-10-CM | POA: Diagnosis not present

## 2018-08-15 DIAGNOSIS — Z940 Kidney transplant status: Secondary | ICD-10-CM | POA: Diagnosis not present

## 2018-08-15 DIAGNOSIS — I129 Hypertensive chronic kidney disease with stage 1 through stage 4 chronic kidney disease, or unspecified chronic kidney disease: Secondary | ICD-10-CM | POA: Diagnosis not present

## 2018-09-10 ENCOUNTER — Ambulatory Visit (INDEPENDENT_AMBULATORY_CARE_PROVIDER_SITE_OTHER): Payer: Medicare Other | Admitting: Otolaryngology

## 2018-09-10 DIAGNOSIS — H903 Sensorineural hearing loss, bilateral: Secondary | ICD-10-CM | POA: Diagnosis not present

## 2018-09-10 DIAGNOSIS — H6123 Impacted cerumen, bilateral: Secondary | ICD-10-CM | POA: Diagnosis not present

## 2018-09-19 DIAGNOSIS — Z940 Kidney transplant status: Secondary | ICD-10-CM | POA: Diagnosis not present

## 2018-09-19 DIAGNOSIS — I129 Hypertensive chronic kidney disease with stage 1 through stage 4 chronic kidney disease, or unspecified chronic kidney disease: Secondary | ICD-10-CM | POA: Diagnosis not present

## 2018-10-17 DIAGNOSIS — H35371 Puckering of macula, right eye: Secondary | ICD-10-CM | POA: Diagnosis not present

## 2018-10-17 DIAGNOSIS — H43813 Vitreous degeneration, bilateral: Secondary | ICD-10-CM | POA: Diagnosis not present

## 2018-10-17 DIAGNOSIS — D3132 Benign neoplasm of left choroid: Secondary | ICD-10-CM | POA: Diagnosis not present

## 2018-10-17 DIAGNOSIS — H43393 Other vitreous opacities, bilateral: Secondary | ICD-10-CM | POA: Diagnosis not present

## 2018-10-26 DIAGNOSIS — Z940 Kidney transplant status: Secondary | ICD-10-CM | POA: Diagnosis not present

## 2018-10-26 DIAGNOSIS — I129 Hypertensive chronic kidney disease with stage 1 through stage 4 chronic kidney disease, or unspecified chronic kidney disease: Secondary | ICD-10-CM | POA: Diagnosis not present

## 2018-11-05 DIAGNOSIS — Z940 Kidney transplant status: Secondary | ICD-10-CM | POA: Diagnosis not present

## 2018-11-05 DIAGNOSIS — D631 Anemia in chronic kidney disease: Secondary | ICD-10-CM | POA: Diagnosis not present

## 2018-11-05 DIAGNOSIS — N183 Chronic kidney disease, stage 3 (moderate): Secondary | ICD-10-CM | POA: Diagnosis not present

## 2018-11-05 DIAGNOSIS — I129 Hypertensive chronic kidney disease with stage 1 through stage 4 chronic kidney disease, or unspecified chronic kidney disease: Secondary | ICD-10-CM | POA: Diagnosis not present

## 2018-11-05 DIAGNOSIS — N2581 Secondary hyperparathyroidism of renal origin: Secondary | ICD-10-CM | POA: Diagnosis not present

## 2018-11-19 DIAGNOSIS — D044 Carcinoma in situ of skin of scalp and neck: Secondary | ICD-10-CM | POA: Diagnosis not present

## 2018-11-19 DIAGNOSIS — D485 Neoplasm of uncertain behavior of skin: Secondary | ICD-10-CM | POA: Diagnosis not present

## 2018-11-19 DIAGNOSIS — L570 Actinic keratosis: Secondary | ICD-10-CM | POA: Diagnosis not present

## 2018-11-22 DIAGNOSIS — C4442 Squamous cell carcinoma of skin of scalp and neck: Secondary | ICD-10-CM | POA: Diagnosis not present

## 2018-11-27 DIAGNOSIS — I129 Hypertensive chronic kidney disease with stage 1 through stage 4 chronic kidney disease, or unspecified chronic kidney disease: Secondary | ICD-10-CM | POA: Diagnosis not present

## 2018-11-27 DIAGNOSIS — Z940 Kidney transplant status: Secondary | ICD-10-CM | POA: Diagnosis not present

## 2019-01-02 DIAGNOSIS — I129 Hypertensive chronic kidney disease with stage 1 through stage 4 chronic kidney disease, or unspecified chronic kidney disease: Secondary | ICD-10-CM | POA: Diagnosis not present

## 2019-01-02 DIAGNOSIS — Z940 Kidney transplant status: Secondary | ICD-10-CM | POA: Diagnosis not present

## 2019-01-09 DIAGNOSIS — Z940 Kidney transplant status: Secondary | ICD-10-CM | POA: Diagnosis not present

## 2019-02-01 DIAGNOSIS — Z940 Kidney transplant status: Secondary | ICD-10-CM | POA: Diagnosis not present

## 2019-02-01 DIAGNOSIS — N2581 Secondary hyperparathyroidism of renal origin: Secondary | ICD-10-CM | POA: Diagnosis not present

## 2019-02-01 DIAGNOSIS — N183 Chronic kidney disease, stage 3 (moderate): Secondary | ICD-10-CM | POA: Diagnosis not present

## 2019-02-01 DIAGNOSIS — D631 Anemia in chronic kidney disease: Secondary | ICD-10-CM | POA: Diagnosis not present

## 2019-02-01 DIAGNOSIS — I129 Hypertensive chronic kidney disease with stage 1 through stage 4 chronic kidney disease, or unspecified chronic kidney disease: Secondary | ICD-10-CM | POA: Diagnosis not present

## 2019-02-28 DIAGNOSIS — I129 Hypertensive chronic kidney disease with stage 1 through stage 4 chronic kidney disease, or unspecified chronic kidney disease: Secondary | ICD-10-CM | POA: Diagnosis not present

## 2019-02-28 DIAGNOSIS — Z940 Kidney transplant status: Secondary | ICD-10-CM | POA: Diagnosis not present

## 2019-02-28 DIAGNOSIS — N189 Chronic kidney disease, unspecified: Secondary | ICD-10-CM | POA: Diagnosis not present

## 2019-03-19 DIAGNOSIS — R229 Localized swelling, mass and lump, unspecified: Secondary | ICD-10-CM | POA: Diagnosis not present

## 2019-03-19 DIAGNOSIS — Z85828 Personal history of other malignant neoplasm of skin: Secondary | ICD-10-CM | POA: Diagnosis not present

## 2019-03-19 DIAGNOSIS — D224 Melanocytic nevi of scalp and neck: Secondary | ICD-10-CM | POA: Diagnosis not present

## 2019-03-19 DIAGNOSIS — D485 Neoplasm of uncertain behavior of skin: Secondary | ICD-10-CM | POA: Diagnosis not present

## 2019-03-19 DIAGNOSIS — L819 Disorder of pigmentation, unspecified: Secondary | ICD-10-CM | POA: Diagnosis not present

## 2019-03-19 DIAGNOSIS — L570 Actinic keratosis: Secondary | ICD-10-CM | POA: Diagnosis not present

## 2019-04-04 DIAGNOSIS — I129 Hypertensive chronic kidney disease with stage 1 through stage 4 chronic kidney disease, or unspecified chronic kidney disease: Secondary | ICD-10-CM | POA: Diagnosis not present

## 2019-04-04 DIAGNOSIS — D2210 Melanocytic nevi of unspecified eyelid, including canthus: Secondary | ICD-10-CM | POA: Diagnosis not present

## 2019-04-04 DIAGNOSIS — Z940 Kidney transplant status: Secondary | ICD-10-CM | POA: Diagnosis not present

## 2019-04-04 DIAGNOSIS — D485 Neoplasm of uncertain behavior of skin: Secondary | ICD-10-CM | POA: Diagnosis not present

## 2019-04-15 DIAGNOSIS — Z940 Kidney transplant status: Secondary | ICD-10-CM | POA: Diagnosis not present

## 2019-04-25 DIAGNOSIS — Z940 Kidney transplant status: Secondary | ICD-10-CM | POA: Diagnosis not present

## 2019-04-25 DIAGNOSIS — Z4822 Encounter for aftercare following kidney transplant: Secondary | ICD-10-CM | POA: Diagnosis not present

## 2019-04-25 DIAGNOSIS — Z7952 Long term (current) use of systemic steroids: Secondary | ICD-10-CM | POA: Diagnosis not present

## 2019-04-25 DIAGNOSIS — D649 Anemia, unspecified: Secondary | ICD-10-CM | POA: Diagnosis not present

## 2019-04-25 DIAGNOSIS — D899 Disorder involving the immune mechanism, unspecified: Secondary | ICD-10-CM | POA: Diagnosis not present

## 2019-04-25 DIAGNOSIS — Z79899 Other long term (current) drug therapy: Secondary | ICD-10-CM | POA: Diagnosis not present

## 2019-04-25 DIAGNOSIS — Z792 Long term (current) use of antibiotics: Secondary | ICD-10-CM | POA: Diagnosis not present

## 2019-04-25 DIAGNOSIS — I10 Essential (primary) hypertension: Secondary | ICD-10-CM | POA: Diagnosis not present

## 2019-04-29 DIAGNOSIS — Z940 Kidney transplant status: Secondary | ICD-10-CM | POA: Diagnosis not present

## 2019-05-08 DIAGNOSIS — N2581 Secondary hyperparathyroidism of renal origin: Secondary | ICD-10-CM | POA: Diagnosis not present

## 2019-05-08 DIAGNOSIS — D631 Anemia in chronic kidney disease: Secondary | ICD-10-CM | POA: Diagnosis not present

## 2019-05-08 DIAGNOSIS — I129 Hypertensive chronic kidney disease with stage 1 through stage 4 chronic kidney disease, or unspecified chronic kidney disease: Secondary | ICD-10-CM | POA: Diagnosis not present

## 2019-05-08 DIAGNOSIS — Z940 Kidney transplant status: Secondary | ICD-10-CM | POA: Diagnosis not present

## 2019-05-08 DIAGNOSIS — N183 Chronic kidney disease, stage 3 (moderate): Secondary | ICD-10-CM | POA: Diagnosis not present

## 2019-06-12 DIAGNOSIS — I129 Hypertensive chronic kidney disease with stage 1 through stage 4 chronic kidney disease, or unspecified chronic kidney disease: Secondary | ICD-10-CM | POA: Diagnosis not present

## 2019-06-12 DIAGNOSIS — Z940 Kidney transplant status: Secondary | ICD-10-CM | POA: Diagnosis not present

## 2019-06-21 ENCOUNTER — Ambulatory Visit: Payer: Medicare Other | Admitting: Urology

## 2019-06-21 ENCOUNTER — Other Ambulatory Visit: Payer: Self-pay

## 2019-06-21 DIAGNOSIS — N401 Enlarged prostate with lower urinary tract symptoms: Secondary | ICD-10-CM | POA: Diagnosis not present

## 2019-06-21 DIAGNOSIS — R3914 Feeling of incomplete bladder emptying: Secondary | ICD-10-CM

## 2019-06-28 DIAGNOSIS — Z940 Kidney transplant status: Secondary | ICD-10-CM | POA: Diagnosis not present

## 2019-07-22 DIAGNOSIS — D1801 Hemangioma of skin and subcutaneous tissue: Secondary | ICD-10-CM | POA: Diagnosis not present

## 2019-07-22 DIAGNOSIS — L570 Actinic keratosis: Secondary | ICD-10-CM | POA: Diagnosis not present

## 2019-07-22 DIAGNOSIS — Z85828 Personal history of other malignant neoplasm of skin: Secondary | ICD-10-CM | POA: Diagnosis not present

## 2019-08-01 DIAGNOSIS — Z940 Kidney transplant status: Secondary | ICD-10-CM | POA: Diagnosis not present

## 2019-08-01 DIAGNOSIS — I129 Hypertensive chronic kidney disease with stage 1 through stage 4 chronic kidney disease, or unspecified chronic kidney disease: Secondary | ICD-10-CM | POA: Diagnosis not present

## 2019-08-02 ENCOUNTER — Other Ambulatory Visit: Payer: Self-pay

## 2019-08-02 ENCOUNTER — Ambulatory Visit: Payer: Medicare Other | Admitting: Urology

## 2019-08-02 DIAGNOSIS — R3912 Poor urinary stream: Secondary | ICD-10-CM

## 2019-08-02 DIAGNOSIS — R3914 Feeling of incomplete bladder emptying: Secondary | ICD-10-CM

## 2019-08-02 DIAGNOSIS — N401 Enlarged prostate with lower urinary tract symptoms: Secondary | ICD-10-CM | POA: Diagnosis not present

## 2019-08-02 DIAGNOSIS — R351 Nocturia: Secondary | ICD-10-CM

## 2019-08-07 DIAGNOSIS — Z23 Encounter for immunization: Secondary | ICD-10-CM | POA: Diagnosis not present

## 2019-08-07 DIAGNOSIS — Z940 Kidney transplant status: Secondary | ICD-10-CM | POA: Diagnosis not present

## 2019-08-07 DIAGNOSIS — D631 Anemia in chronic kidney disease: Secondary | ICD-10-CM | POA: Diagnosis not present

## 2019-08-07 DIAGNOSIS — N183 Chronic kidney disease, stage 3 unspecified: Secondary | ICD-10-CM | POA: Diagnosis not present

## 2019-08-07 DIAGNOSIS — I129 Hypertensive chronic kidney disease with stage 1 through stage 4 chronic kidney disease, or unspecified chronic kidney disease: Secondary | ICD-10-CM | POA: Diagnosis not present

## 2019-08-14 DIAGNOSIS — Z940 Kidney transplant status: Secondary | ICD-10-CM | POA: Diagnosis not present

## 2019-09-16 ENCOUNTER — Ambulatory Visit (INDEPENDENT_AMBULATORY_CARE_PROVIDER_SITE_OTHER): Payer: Medicare Other | Admitting: Otolaryngology

## 2019-09-16 ENCOUNTER — Other Ambulatory Visit: Payer: Self-pay

## 2019-09-18 DIAGNOSIS — N183 Chronic kidney disease, stage 3 unspecified: Secondary | ICD-10-CM | POA: Diagnosis not present

## 2019-09-18 DIAGNOSIS — Z940 Kidney transplant status: Secondary | ICD-10-CM | POA: Diagnosis not present

## 2019-10-01 ENCOUNTER — Encounter: Payer: Self-pay | Admitting: Urology

## 2019-10-07 DIAGNOSIS — Z940 Kidney transplant status: Secondary | ICD-10-CM | POA: Diagnosis not present

## 2019-10-15 ENCOUNTER — Ambulatory Visit: Payer: Medicare Other | Attending: Internal Medicine

## 2019-10-15 ENCOUNTER — Other Ambulatory Visit: Payer: Self-pay

## 2019-10-15 DIAGNOSIS — Z20822 Contact with and (suspected) exposure to covid-19: Secondary | ICD-10-CM | POA: Diagnosis not present

## 2019-10-17 ENCOUNTER — Telehealth: Payer: Self-pay

## 2019-10-17 ENCOUNTER — Telehealth: Payer: Self-pay | Admitting: *Deleted

## 2019-10-17 LAB — NOVEL CORONAVIRUS, NAA: SARS-CoV-2, NAA: DETECTED — AB

## 2019-10-17 NOTE — Telephone Encounter (Signed)
Patient's wife calling back for COVID test results- notified still pending.

## 2019-10-17 NOTE — Telephone Encounter (Signed)
Wife checking on COVID 19 results, not available yet.

## 2019-10-17 NOTE — Telephone Encounter (Signed)
Pt calling for covid results; pending,not resulted as of yet. Verbalizes understanding.

## 2019-10-19 ENCOUNTER — Encounter (HOSPITAL_COMMUNITY): Payer: Self-pay | Admitting: *Deleted

## 2019-10-19 ENCOUNTER — Emergency Department (HOSPITAL_COMMUNITY)
Admission: EM | Admit: 2019-10-19 | Discharge: 2019-10-19 | Disposition: A | Discharge status: Home / Self Care | Payer: Medicare Other | Attending: Emergency Medicine | Admitting: Emergency Medicine

## 2019-10-19 ENCOUNTER — Emergency Department (HOSPITAL_COMMUNITY): Payer: Medicare Other

## 2019-10-19 ENCOUNTER — Other Ambulatory Visit: Payer: Self-pay

## 2019-10-19 DIAGNOSIS — R059 Cough, unspecified: Secondary | ICD-10-CM

## 2019-10-19 DIAGNOSIS — U071 COVID-19: Secondary | ICD-10-CM | POA: Insufficient documentation

## 2019-10-19 DIAGNOSIS — R0902 Hypoxemia: Secondary | ICD-10-CM | POA: Diagnosis not present

## 2019-10-19 DIAGNOSIS — I5022 Chronic systolic (congestive) heart failure: Secondary | ICD-10-CM | POA: Insufficient documentation

## 2019-10-19 DIAGNOSIS — Z7982 Long term (current) use of aspirin: Secondary | ICD-10-CM | POA: Insufficient documentation

## 2019-10-19 DIAGNOSIS — Z87891 Personal history of nicotine dependence: Secondary | ICD-10-CM | POA: Diagnosis not present

## 2019-10-19 DIAGNOSIS — Z79899 Other long term (current) drug therapy: Secondary | ICD-10-CM | POA: Insufficient documentation

## 2019-10-19 DIAGNOSIS — I130 Hypertensive heart and chronic kidney disease with heart failure and stage 1 through stage 4 chronic kidney disease, or unspecified chronic kidney disease: Secondary | ICD-10-CM | POA: Diagnosis not present

## 2019-10-19 DIAGNOSIS — R05 Cough: Secondary | ICD-10-CM

## 2019-10-19 DIAGNOSIS — R0602 Shortness of breath: Secondary | ICD-10-CM | POA: Diagnosis present

## 2019-10-19 DIAGNOSIS — N184 Chronic kidney disease, stage 4 (severe): Secondary | ICD-10-CM | POA: Diagnosis not present

## 2019-10-19 DIAGNOSIS — J1289 Other viral pneumonia: Secondary | ICD-10-CM | POA: Diagnosis not present

## 2019-10-19 DIAGNOSIS — J129 Viral pneumonia, unspecified: Secondary | ICD-10-CM

## 2019-10-19 DIAGNOSIS — J189 Pneumonia, unspecified organism: Secondary | ICD-10-CM | POA: Insufficient documentation

## 2019-10-19 LAB — CBC WITH DIFFERENTIAL/PLATELET
Abs Immature Granulocytes: 0.02 10*3/uL (ref 0.00–0.07)
Basophils Absolute: 0 10*3/uL (ref 0.0–0.1)
Basophils Relative: 0 %
Eosinophils Absolute: 0 10*3/uL (ref 0.0–0.5)
Eosinophils Relative: 1 %
HCT: 41.3 % (ref 39.0–52.0)
Hemoglobin: 13.6 g/dL (ref 13.0–17.0)
Immature Granulocytes: 0 %
Lymphocytes Relative: 4 %
Lymphs Abs: 0.2 10*3/uL — ABNORMAL LOW (ref 0.7–4.0)
MCH: 30.4 pg (ref 26.0–34.0)
MCHC: 32.9 g/dL (ref 30.0–36.0)
MCV: 92.4 fL (ref 80.0–100.0)
Monocytes Absolute: 0.6 10*3/uL (ref 0.1–1.0)
Monocytes Relative: 11 %
Neutro Abs: 4.9 10*3/uL (ref 1.7–7.7)
Neutrophils Relative %: 84 %
Platelets: 218 10*3/uL (ref 150–400)
RBC: 4.47 MIL/uL (ref 4.22–5.81)
RDW: 13 % (ref 11.5–15.5)
WBC: 5.8 10*3/uL (ref 4.0–10.5)
nRBC: 0 % (ref 0.0–0.2)

## 2019-10-19 LAB — COMPREHENSIVE METABOLIC PANEL
ALT: 19 U/L (ref 0–44)
AST: 21 U/L (ref 15–41)
Albumin: 3.1 g/dL — ABNORMAL LOW (ref 3.5–5.0)
Alkaline Phosphatase: 63 U/L (ref 38–126)
Anion gap: 13 (ref 5–15)
BUN: 40 mg/dL — ABNORMAL HIGH (ref 8–23)
CO2: 18 mmol/L — ABNORMAL LOW (ref 22–32)
Calcium: 9.1 mg/dL (ref 8.9–10.3)
Chloride: 97 mmol/L — ABNORMAL LOW (ref 98–111)
Creatinine, Ser: 2.23 mg/dL — ABNORMAL HIGH (ref 0.61–1.24)
GFR calc Af Amer: 32 mL/min — ABNORMAL LOW (ref >60)
GFR calc non Af Amer: 27 mL/min — ABNORMAL LOW (ref >60)
Glucose, Bld: 133 mg/dL — ABNORMAL HIGH (ref 70–99)
Potassium: 4.8 mmol/L (ref 3.5–5.1)
Sodium: 128 mmol/L — ABNORMAL LOW (ref 135–145)
Total Bilirubin: 1.4 mg/dL — ABNORMAL HIGH (ref 0.3–1.2)
Total Protein: 6.8 g/dL (ref 6.5–8.1)

## 2019-10-19 LAB — LACTIC ACID, PLASMA
Lactic Acid, Venous: 1 mmol/L (ref 0.5–1.9)
Lactic Acid, Venous: 1.3 mmol/L (ref 0.5–1.9)

## 2019-10-19 LAB — D-DIMER, QUANTITATIVE: D-Dimer, Quant: 1.39 ug/mL-FEU — ABNORMAL HIGH (ref 0.00–0.50)

## 2019-10-19 LAB — FERRITIN: Ferritin: 695 ng/mL — ABNORMAL HIGH (ref 24–336)

## 2019-10-19 LAB — LACTATE DEHYDROGENASE: LDH: 171 U/L (ref 98–192)

## 2019-10-19 LAB — C-REACTIVE PROTEIN: CRP: 13.8 mg/dL — ABNORMAL HIGH (ref <1.0)

## 2019-10-19 LAB — TRIGLYCERIDES: Triglycerides: 143 mg/dL (ref <150)

## 2019-10-19 LAB — PROCALCITONIN: Procalcitonin: 0.11 ng/mL

## 2019-10-19 LAB — FIBRINOGEN: Fibrinogen: 550 mg/dL — ABNORMAL HIGH (ref 210–475)

## 2019-10-19 MED ORDER — AZITHROMYCIN 250 MG PO TABS: ORAL_TABLET | ORAL | 0 refills | Status: AC

## 2019-10-19 NOTE — ED Notes (Addendum)
Spoke with spouse  [phone]  She reports they have spoken with neph But did not contact transplant coordinator at [facility] where pt had transplant

## 2019-10-19 NOTE — ED Notes (Signed)
At unmasked Christmas fami.y gathering   Covid pos  Feels badly x 1 week   here for eval

## 2019-10-19 NOTE — ED Notes (Signed)
Rad to room

## 2019-10-19 NOTE — ED Triage Notes (Signed)
Pt with covid and c/o sob for over a week.  Sob getting worse every day per pt.

## 2019-10-19 NOTE — Discharge Instructions (Addendum)
The [facility] covid clinic scheduler will call you to schedule care.

## 2019-10-19 NOTE — ED Notes (Signed)
Call to spouse, she will be here in 20 minutes to get pt

## 2019-10-19 NOTE — ED Provider Notes (Signed)
[facility] Provider Note   CSN: [id number] Arrival date & time: 10/19/19  1053     History Chief Complaint  Patient presents with  . Shortness of Breath    covid positive the other day    Alan Hawkins is a 79 y.o. male.  The history is provided by the patient. No language interpreter was used.  Shortness of Breath Severity:  Moderate Onset quality:  Gradual Duration:  4 days Timing:  Constant Progression:  Worsening Chronicity:  New Context: URI   Relieved by:  Nothing Worsened by:  Nothing Ineffective treatments:  None tried Associated symptoms: sputum production   Risk factors: no recent surgery and no tobacco use    Pt complains of shortness of breath.  Pt reports symptoms started 4 days ago.  Pt had a positive covid test. Pt had a renal transplant in 2016. Pt is concerned illness could cause him to reject his kidney    Past Medical History:  Diagnosis Date  . AAA (abdominal aortic aneurysm) ([place])   . Acute lacunar stroke ([place]) 08/22/2016  . Anemia    iron infusions with dialysis occasionally  . Arthritis   . Cancer ([place]) [phone]   basal cell carcinoma right ear  . Cerebrovascular disease 08/22/2016  . CHF (congestive heart failure) ([place])   . CKD (chronic kidney disease) stage 3, GFR 30-59 ml/min    hd mwf - started jan 2015  . Essential hypertension, benign   . GERD (gastroesophageal reflux disease)   . Heart murmur   . Hyperlipidemia     Patient Active Problem List   Diagnosis Date Noted  . Acute lacunar stroke ([place]) 08/22/2016  . Essential hypertension 08/22/2016  . Cerebrovascular disease 08/22/2016  . Chronic kidney disease (CKD), stage IV (severe) ([place]) 08/22/2016  . Current chronic use of systemic steroids 08/22/2016  . Hyperlipidemia 08/22/2016  . Abdominal aortic aneurysm without rupture ([place]) 08/04/2014  . AAA (abdominal aortic aneurysm) without rupture ([place]) 07/15/2014  . Ischemic cardiomyopathy 09/20/2013  . Acute  on chronic systolic heart failure ([place]) 09/20/2013  . Acute on chronic renal failure ([place]) 09/20/2013    Past Surgical History:  Procedure Laterality Date  . ABDOMINAL AORTIC ENDOVASCULAR STENT GRAFT Bilateral 08/04/2014   Procedure: ABDOMINAL AORTIC ENDOVASCULAR STENT GRAFT;  Surgeon: Rosetta Posner, MD;  Location: [facility];  Service: Vascular;  Laterality: Bilateral;  . AV FISTULA PLACEMENT Left   . CATARACT EXTRACTION W/PHACO  11/17/2011   Procedure: CATARACT EXTRACTION PHACO AND INTRAOCULAR LENS PLACEMENT (IOC);  Surgeon: Tonny Branch, MD;  [facility];  Service: Ophthalmology;  Laterality: Right;  CDE=16.17  . CATARACT EXTRACTION W/PHACO  12/05/2011   Procedure: CATARACT EXTRACTION PHACO AND INTRAOCULAR LENS PLACEMENT (IOC);  Surgeon: Tonny Branch, MD;  Location: [facility];  Service: Ophthalmology;  Laterality: Left;  CDE 16.22  . COLONOSCOPY N/A 05/20/2014   Procedure: COLONOSCOPY;  Surgeon: Rogene Houston, MD;  [facility];  Service: Endoscopy;  Laterality: N/A;  730  . excision of basal cell carcinoma right ear Right 05-2014  . EYE SURGERY     R KPE w/ IOL  . INSERTION OF DIALYSIS CATHETER Right 10/15/2013   removed  . KIDNEY TRANSPLANT Right 01-08-2015   WAKE f[facility]  . VASECTOMY         Family History  Problem Relation Age of Onset  . Heart disease Mother   . Cancer Mother   . Heart disease Sister   . Heart attack  Sister   . Stroke Father   . Hypertension Son     Social History   Tobacco Use  . Smoking status: Former Smoker    Types: Cigarettes  . Smokeless tobacco: Never Used  . Tobacco comment: "Stopped smoking at least 35-40 years ago"  Substance Use Topics  . Alcohol use: No    Alcohol/week: 0.0 standard drinks    Comment: Occasional monthly beer  . Drug use: No    Home Medications Prior to Admission medications   Medication Sig Start Date End Date Taking? Authorizing Provider  acetaminophen (TYLENOL) 500 MG tablet Take 500-1,000 mg by mouth  every 6 (six) hours as needed for moderate pain or headache.   Yes [provider]  amLODipine (NORVASC) 10 MG tablet Take 10 mg by mouth daily.    Yes [provider]  aspirin EC 81 MG tablet Take 2 tablets (162 mg total) by mouth daily. 08/22/16  Yes Rexene Alberts, MD  atorvastatin (LIPITOR) 20 MG tablet Take 1 tablet (20 mg total) by mouth daily at 6 PM. 08/23/16  Yes Rexene Alberts, MD  calcitRIOL (ROCALTROL) 0.25 MCG capsule Take 0.25 mcg by mouth every other day.   Yes [provider]  cloNIDine (CATAPRES) 0.1 MG tablet Take 0.1 mg by mouth 2 (two) times daily.   Yes [provider]  Co-Enzyme Q-10 30 MG CAPS Take 30 mg by mouth daily.    Yes [provider]  docusate sodium (COLACE) 100 MG capsule Take 100 mg by mouth 2 (two) times daily.   Yes [provider]  finasteride (PROSCAR) 5 MG tablet Take 5 mg by mouth daily. 08/02/19  Yes [provider]  fluticasone (FLONASE) 50 MCG/ACT nasal spray Place 1 spray into both nostrils daily as needed.    Yes [provider]  labetalol (NORMODYNE) 300 MG tablet Take 300 mg by mouth 2 (two) times daily.   Yes [provider]  mycophenolate (MYFORTIC) 180 MG EC tablet Take 180 mg by mouth 2 (two) times daily.   Yes [provider]  Polyethyl Glycol-Propyl Glycol (SYSTANE OP) Apply 1 drop to eye daily as needed (dry eyes).    Yes [provider]  polyethylene glycol (MIRALAX) packet Take 17 g by mouth daily. Take 17 g by mouth daily as needed (Constipation). 11/19/13  Yes [provider]  predniSONE (DELTASONE) 5 MG tablet Take 5 mg by mouth daily with breakfast.   Yes [provider]  ranitidine (ZANTAC) 150 MG tablet Take 150 mg by mouth 2 (two) times daily.    Yes [provider]  tacrolimus (PROGRAF) 0.5 MG capsule Take 7 mg by mouth 2 (two) times daily.    Yes [provider]  tamsulosin (FLOMAX) 0.4 MG CAPS capsule  Take 1 capsule by mouth at bedtime. 08/21/16  Yes [provider]  azithromycin (ZITHROMAX Z-PAK) 250 MG tablet Take 2 tablets (500 mg) on  Day 1,  followed by 1 tablet (250 mg) once daily on Days 2 through 5. 10/19/19 10/24/19  Fransico Meadow, PA-C  cetirizine (ZYRTEC) 10 MG tablet Take 10 mg by mouth at bedtime.    [provider]    Allergies    Bee venom, Nitrofuran derivatives, Nitroglycerin, and Tape  Review of Systems   Review of Systems  Respiratory: Positive for sputum production and shortness of breath.   All other systems reviewed and are negative.   Physical Exam Updated Vital Signs BP 112/80   Pulse  83   Temp 98.6 F (37 C) (Oral)   Resp 16   SpO2 97%   Physical Exam Vitals and nursing note reviewed.  Constitutional:      Appearance: He is well-developed.  HENT:     Head: Normocephalic and atraumatic.     Mouth/Throat:     Mouth: Mucous membranes are moist.  Eyes:     Conjunctiva/sclera: Conjunctivae normal.     Pupils: Pupils are equal, round, and reactive to light.  Cardiovascular:     Rate and Rhythm: Normal rate and regular rhythm.     Heart sounds: No murmur.  Pulmonary:     Effort: Pulmonary effort is normal. No respiratory distress.     Breath sounds: Rhonchi present.  Abdominal:     Palpations: Abdomen is soft.     Tenderness: There is no abdominal tenderness.  Musculoskeletal:        General: Normal range of motion.     Cervical back: Normal range of motion and neck supple.  Skin:    General: Skin is warm and dry.  Neurological:     Mental Status: He is alert.  Psychiatric:        Mood and Affect: Mood normal.     ED Results / Procedures / Treatments   Labs (all labs ordered are listed, but only abnormal results are displayed) Labs Reviewed  CBC WITH DIFFERENTIAL/PLATELET - Abnormal; Notable for the following components:      Result Value   Lymphs Abs 0.2 (*)    All other components within normal limits   COMPREHENSIVE METABOLIC PANEL - Abnormal; Notable for the following components:   Sodium 128 (*)    Chloride 97 (*)    CO2 18 (*)    Glucose, Bld 133 (*)    BUN 40 (*)    Creatinine, Ser 2.23 (*)    Albumin 3.1 (*)    Total Bilirubin 1.4 (*)    GFR calc non Af Amer 27 (*)    GFR calc Af Amer 32 (*)    All other components within normal limits  D-DIMER, QUANTITATIVE (NOT AT [facility]) - Abnormal; Notable for the following components:   D-Dimer, Quant 1.39 (*)    All other components within normal limits  FERRITIN - Abnormal; Notable for the following components:   Ferritin 695 (*)    All other components within normal limits  FIBRINOGEN - Abnormal; Notable for the following components:   Fibrinogen 550 (*)    All other components within normal limits  C-REACTIVE PROTEIN - Abnormal; Notable for the following components:   CRP 13.8 (*)    All other components within normal limits  CULTURE, BLOOD (ROUTINE X 2)  CULTURE, BLOOD (ROUTINE X 2)  LACTIC ACID, PLASMA  LACTIC ACID, PLASMA  PROCALCITONIN  LACTATE DEHYDROGENASE  TRIGLYCERIDES    EKG None  Radiology DG Chest Port 1 View  Result Date: 10/19/2019 CLINICAL DATA:  COVID-19 positive patient.  Worsening cough. EXAM: PORTABLE CHEST 1 VIEW COMPARISON:  August 04, 2014 FINDINGS: No pneumothorax. Suggested subtle peripheral patchy opacities. Cardiomegaly. Superimposed pulmonary venous congestion not excluded. No pneumothorax. IMPRESSION: 1. Subtle peripheral patchy opacities likely from the patient's COVID-19 status. 2. Cardiomegaly. Subtle pulmonary venous congestion is not excluded. Electronically Signed   By: Dorise Bullion III M.D   On: 10/19/2019 14:06    Procedures Procedures (including critical care time)  Medications Ordered in ED Medications - No data to display  ED Course  I have reviewed the  triage vital signs and the nursing notes.  Pertinent labs & imaging results that were available during my care of the patient  were reviewed by me and considered in my medical decision making (see chart for details).    MDM Rules/Calculators/A&P                      MDM  Chest xray shows probable covid pneumonia.   I spoke with Dr. Dorothyann Gibbs Nephrology at [facility].  She advised continue current medications.   Pt is agreeable to talking to outpt infusion clinic.  I called and left a message.   Pt is currently stable but understands the need to return if symptoms worsen or cahnge.  Final Clinical Impression(s) / ED Diagnoses Final diagnoses:  Hypoxia  COVID-19  Viral pneumonia    Rx / DC Orders ED Discharge Orders         Ordered    azithromycin (ZITHROMAX Z-PAK) 250 MG tablet     10/19/19 1719        An After Visit Summary was printed and given to the patient.    Sidney Ace 10/19/19 2146    Fredia Sorrow, MD 11/01/19 1524

## 2019-10-20 ENCOUNTER — Other Ambulatory Visit (HOSPITAL_COMMUNITY): Payer: Self-pay

## 2019-10-20 ENCOUNTER — Other Ambulatory Visit: Payer: Self-pay | Admitting: Nurse Practitioner

## 2019-10-20 ENCOUNTER — Telehealth: Payer: Self-pay | Admitting: Nurse Practitioner

## 2019-10-20 DIAGNOSIS — U071 COVID-19: Secondary | ICD-10-CM

## 2019-10-20 NOTE — Progress Notes (Signed)
[facility]AM infusion orders

## 2019-10-20 NOTE — Telephone Encounter (Signed)
  I connected by phone with Rowland Lathe on 10/20/2019 at 9:24 AM to discuss the potential use of an new treatment for mild to moderate COVID-19 viral infection in non-hospitalized patients. On 10/22/2019 he will be at day 9 of symptoms, will place him on 1630 slot for BAM.  Spoke to both his wife and him, both would like him to have infusion.  He is high risk with age >77 and multiple co morbidities.  Continues to have ongoing symptoms and was seen in ER yesterday.  They are aware if any worsening symptoms, SOB, or difficulty breathing to immediately go to ER.  Will also place on cancellation list for Monday.  This patient is a 79 y.o. male that meets the FDA criteria for Emergency Use Authorization of bamlanivimab or casirivimab\imdevimab.  Has a (+) direct SARS-CoV-2 viral test result  Has mild or moderate COVID-19   Is ? 79 years of age and weighs ? 40 kg  Is NOT hospitalized due to COVID-19  Is NOT requiring oxygen therapy or requiring an increase in baseline oxygen flow rate due to COVID-19  Is within 10 days of symptom onset  Has at least one of the high risk factor(s) for progression to severe COVID-19 and/or hospitalization as defined in EUA.  Specific high risk criteria : >/= 79 yo   I have spoken and communicated the following to the patient or parent/caregiver:  1. FDA has authorized the emergency use of bamlanivimab and casirivimab\imdevimab for the treatment of mild to moderate COVID-19 in adults and pediatric patients with positive results of direct SARS-CoV-2 viral testing who are 13 years of age and older weighing at least 40 kg, and who are at high risk for progressing to severe COVID-19 and/or hospitalization.  2. The significant known and potential risks and benefits of bamlanivimab and casirivimab\imdevimab, and the extent to which such potential risks and benefits are unknown.  3. Information on available alternative treatments and the risks and benefits of those  alternatives, including clinical trials.  4. Patients treated with bamlanivimab and casirivimab\imdevimab should continue to self-isolate and use infection control measures (e.g., wear mask, isolate, social distance, avoid sharing personal items, clean and disinfect "high touch" surfaces, and frequent handwashing) according to CDC guidelines.   5. The patient or parent/caregiver has the option to accept or refuse bamlanivimab or casirivimab\imdevimab .  After reviewing this information with the patient, The patient agreed to proceed with receiving the bamlanimivab infusion and will be provided a copy of the Fact sheet prior to receiving the infusion.Henrine Screws T Darl Kuss 10/20/2019 9:24 AM

## 2019-10-21 ENCOUNTER — Ambulatory Visit (HOSPITAL_COMMUNITY)
Admission: RE | Admit: 2019-10-21 | Discharge: 2019-10-21 | Disposition: A | Discharge status: Home / Self Care | Payer: Medicare Other | Source: Ambulatory Visit | Attending: Pulmonary Disease | Admitting: Pulmonary Disease

## 2019-10-21 DIAGNOSIS — U071 COVID-19: Secondary | ICD-10-CM | POA: Diagnosis present

## 2019-10-21 DIAGNOSIS — Z23 Encounter for immunization: Secondary | ICD-10-CM | POA: Insufficient documentation

## 2019-10-21 MED ORDER — FAMOTIDINE IN NACL 20-0.9 MG/50ML-% IV SOLN: 20.0000 mg | Freq: Once | INTRAVENOUS | Status: DC | PRN

## 2019-10-21 MED ORDER — DIPHENHYDRAMINE HCL 50 MG/ML IJ SOLN: 50.0000 mg | Freq: Once | INTRAMUSCULAR | Status: DC | PRN

## 2019-10-21 MED ORDER — ALBUTEROL SULFATE HFA 108 (90 BASE) MCG/ACT IN AERS: 2.0000 | INHALATION_SPRAY | Freq: Once | RESPIRATORY_TRACT | Status: DC | PRN

## 2019-10-21 MED ORDER — EPINEPHRINE 0.3 MG/0.3ML IJ SOAJ: 0.3000 mg | Freq: Once | INTRAMUSCULAR | Status: DC | PRN

## 2019-10-21 MED ORDER — SODIUM CHLORIDE 0.9 % IV SOLN
700.0000 mg | Freq: Once | INTRAVENOUS | Status: AC
  Administered 2019-10-21: 700 mg via INTRAVENOUS
  Filled 2019-10-21: qty 20

## 2019-10-21 MED ORDER — SODIUM CHLORIDE 0.9 % IV SOLN
INTRAVENOUS | Status: DC | PRN
  Administered 2019-10-21: 250 mL via INTRAVENOUS

## 2019-10-21 MED ORDER — METHYLPREDNISOLONE SODIUM SUCC 125 MG IJ SOLR: 125.0000 mg | Freq: Once | INTRAMUSCULAR | Status: DC | PRN

## 2019-10-21 NOTE — Progress Notes (Signed)
  Diagnosis: COVID-19  Physician: Dr. Joya Gaskins  Procedure: [facility] Med: bamlanivimab infusion - Provided patient with bamlanimivab fact sheet for patients, parents and caregivers prior to infusion.  Complications: No immediate complications noted.  Discharge: Discharged home   Babs Sciara 10/21/2019

## 2019-10-21 NOTE — Discharge Instructions (Signed)

## 2019-10-22 ENCOUNTER — Ambulatory Visit (HOSPITAL_COMMUNITY): Payer: Medicare Other

## 2019-10-24 LAB — CULTURE, BLOOD (ROUTINE X 2)
Culture: NO GROWTH
Culture: NO GROWTH
Special Requests: ADEQUATE
Special Requests: ADEQUATE

## 2019-10-30 ENCOUNTER — Inpatient Hospital Stay (HOSPITAL_COMMUNITY)
Admission: EM | Admit: 2019-10-30 | Discharge: 2019-11-05 | DRG: 177 | Disposition: A | Discharge status: Home / Self Care | Payer: Medicare Other | Attending: Internal Medicine | Admitting: Internal Medicine

## 2019-10-30 ENCOUNTER — Inpatient Hospital Stay (HOSPITAL_COMMUNITY): Payer: Medicare Other

## 2019-10-30 ENCOUNTER — Emergency Department (HOSPITAL_COMMUNITY): Payer: Medicare Other

## 2019-10-30 ENCOUNTER — Other Ambulatory Visit: Payer: Self-pay

## 2019-10-30 DIAGNOSIS — J1282 Pneumonia due to coronavirus disease 2019: Secondary | ICD-10-CM | POA: Diagnosis present

## 2019-10-30 DIAGNOSIS — I361 Nonrheumatic tricuspid (valve) insufficiency: Secondary | ICD-10-CM

## 2019-10-30 DIAGNOSIS — I255 Ischemic cardiomyopathy: Secondary | ICD-10-CM | POA: Diagnosis not present

## 2019-10-30 DIAGNOSIS — M7989 Other specified soft tissue disorders: Secondary | ICD-10-CM | POA: Diagnosis present

## 2019-10-30 DIAGNOSIS — M1990 Unspecified osteoarthritis, unspecified site: Secondary | ICD-10-CM | POA: Diagnosis present

## 2019-10-30 DIAGNOSIS — N189 Chronic kidney disease, unspecified: Secondary | ICD-10-CM | POA: Diagnosis not present

## 2019-10-30 DIAGNOSIS — I5032 Chronic diastolic (congestive) heart failure: Secondary | ICD-10-CM | POA: Diagnosis present

## 2019-10-30 DIAGNOSIS — Z7952 Long term (current) use of systemic steroids: Secondary | ICD-10-CM | POA: Diagnosis not present

## 2019-10-30 DIAGNOSIS — J9601 Acute respiratory failure with hypoxia: Secondary | ICD-10-CM

## 2019-10-30 DIAGNOSIS — E669 Obesity, unspecified: Secondary | ICD-10-CM | POA: Diagnosis present

## 2019-10-30 DIAGNOSIS — Z6826 Body mass index (BMI) 26.0-26.9, adult: Secondary | ICD-10-CM

## 2019-10-30 DIAGNOSIS — Z8249 Family history of ischemic heart disease and other diseases of the circulatory system: Secondary | ICD-10-CM

## 2019-10-30 DIAGNOSIS — E875 Hyperkalemia: Secondary | ICD-10-CM | POA: Diagnosis present

## 2019-10-30 DIAGNOSIS — Z91048 Other nonmedicinal substance allergy status: Secondary | ICD-10-CM

## 2019-10-30 DIAGNOSIS — R0602 Shortness of breath: Secondary | ICD-10-CM | POA: Diagnosis not present

## 2019-10-30 DIAGNOSIS — U071 COVID-19: Secondary | ICD-10-CM | POA: Diagnosis present

## 2019-10-30 DIAGNOSIS — Z7982 Long term (current) use of aspirin: Secondary | ICD-10-CM

## 2019-10-30 DIAGNOSIS — E785 Hyperlipidemia, unspecified: Secondary | ICD-10-CM | POA: Diagnosis not present

## 2019-10-30 DIAGNOSIS — I2510 Atherosclerotic heart disease of native coronary artery without angina pectoris: Secondary | ICD-10-CM | POA: Diagnosis not present

## 2019-10-30 DIAGNOSIS — Z888 Allergy status to other drugs, medicaments and biological substances status: Secondary | ICD-10-CM

## 2019-10-30 DIAGNOSIS — I714 Abdominal aortic aneurysm, without rupture: Secondary | ICD-10-CM | POA: Diagnosis not present

## 2019-10-30 DIAGNOSIS — Z940 Kidney transplant status: Secondary | ICD-10-CM

## 2019-10-30 DIAGNOSIS — N400 Enlarged prostate without lower urinary tract symptoms: Secondary | ICD-10-CM | POA: Diagnosis not present

## 2019-10-30 DIAGNOSIS — K219 Gastro-esophageal reflux disease without esophagitis: Secondary | ICD-10-CM | POA: Diagnosis present

## 2019-10-30 DIAGNOSIS — T8619 Other complication of kidney transplant: Secondary | ICD-10-CM | POA: Diagnosis present

## 2019-10-30 DIAGNOSIS — Z85828 Personal history of other malignant neoplasm of skin: Secondary | ICD-10-CM

## 2019-10-30 DIAGNOSIS — Z91030 Bee allergy status: Secondary | ICD-10-CM

## 2019-10-30 DIAGNOSIS — I10 Essential (primary) hypertension: Secondary | ICD-10-CM | POA: Diagnosis not present

## 2019-10-30 DIAGNOSIS — I130 Hypertensive heart and chronic kidney disease with heart failure and stage 1 through stage 4 chronic kidney disease, or unspecified chronic kidney disease: Secondary | ICD-10-CM | POA: Diagnosis not present

## 2019-10-30 DIAGNOSIS — N184 Chronic kidney disease, stage 4 (severe): Secondary | ICD-10-CM | POA: Diagnosis present

## 2019-10-30 DIAGNOSIS — Z79899 Other long term (current) drug therapy: Secondary | ICD-10-CM | POA: Diagnosis not present

## 2019-10-30 DIAGNOSIS — N179 Acute kidney failure, unspecified: Secondary | ICD-10-CM | POA: Diagnosis present

## 2019-10-30 DIAGNOSIS — I129 Hypertensive chronic kidney disease with stage 1 through stage 4 chronic kidney disease, or unspecified chronic kidney disease: Secondary | ICD-10-CM | POA: Diagnosis not present

## 2019-10-30 DIAGNOSIS — Z8673 Personal history of transient ischemic attack (TIA), and cerebral infarction without residual deficits: Secondary | ICD-10-CM

## 2019-10-30 DIAGNOSIS — Z823 Family history of stroke: Secondary | ICD-10-CM

## 2019-10-30 DIAGNOSIS — I679 Cerebrovascular disease, unspecified: Secondary | ICD-10-CM | POA: Diagnosis present

## 2019-10-30 DIAGNOSIS — Z87891 Personal history of nicotine dependence: Secondary | ICD-10-CM

## 2019-10-30 DIAGNOSIS — I340 Nonrheumatic mitral (valve) insufficiency: Secondary | ICD-10-CM

## 2019-10-30 LAB — CBC WITH DIFFERENTIAL/PLATELET
Abs Immature Granulocytes: 0.04 10*3/uL (ref 0.00–0.07)
Basophils Absolute: 0 10*3/uL (ref 0.0–0.1)
Basophils Relative: 0 %
Eosinophils Absolute: 0.3 10*3/uL (ref 0.0–0.5)
Eosinophils Relative: 4 %
HCT: 37.9 % — ABNORMAL LOW (ref 39.0–52.0)
Hemoglobin: 12.1 g/dL — ABNORMAL LOW (ref 13.0–17.0)
Immature Granulocytes: 1 %
Lymphocytes Relative: 6 %
Lymphs Abs: 0.5 10*3/uL — ABNORMAL LOW (ref 0.7–4.0)
MCH: 30.4 pg (ref 26.0–34.0)
MCHC: 31.9 g/dL (ref 30.0–36.0)
MCV: 95.2 fL (ref 80.0–100.0)
Monocytes Absolute: 1 10*3/uL (ref 0.1–1.0)
Monocytes Relative: 13 %
Neutro Abs: 6 10*3/uL (ref 1.7–7.7)
Neutrophils Relative %: 76 %
Platelets: 195 10*3/uL (ref 150–400)
RBC: 3.98 MIL/uL — ABNORMAL LOW (ref 4.22–5.81)
RDW: 13.1 % (ref 11.5–15.5)
WBC: 7.9 10*3/uL (ref 4.0–10.5)
nRBC: 0 % (ref 0.0–0.2)

## 2019-10-30 LAB — LACTATE DEHYDROGENASE: LDH: 186 U/L (ref 98–192)

## 2019-10-30 LAB — COMPREHENSIVE METABOLIC PANEL
ALT: 47 U/L — ABNORMAL HIGH (ref 0–44)
AST: 31 U/L (ref 15–41)
Albumin: 2.8 g/dL — ABNORMAL LOW (ref 3.5–5.0)
Alkaline Phosphatase: 83 U/L (ref 38–126)
Anion gap: 10 (ref 5–15)
BUN: 38 mg/dL — ABNORMAL HIGH (ref 8–23)
CO2: 19 mmol/L — ABNORMAL LOW (ref 22–32)
Calcium: 9.6 mg/dL (ref 8.9–10.3)
Chloride: 108 mmol/L (ref 98–111)
Creatinine, Ser: 2.52 mg/dL — ABNORMAL HIGH (ref 0.61–1.24)
GFR calc Af Amer: 27 mL/min — ABNORMAL LOW (ref >60)
GFR calc non Af Amer: 23 mL/min — ABNORMAL LOW (ref >60)
Glucose, Bld: 147 mg/dL — ABNORMAL HIGH (ref 70–99)
Potassium: 5.6 mmol/L — ABNORMAL HIGH (ref 3.5–5.1)
Sodium: 137 mmol/L (ref 135–145)
Total Bilirubin: 0.9 mg/dL (ref 0.3–1.2)
Total Protein: 6.4 g/dL — ABNORMAL LOW (ref 6.5–8.1)

## 2019-10-30 LAB — C-REACTIVE PROTEIN: CRP: 7.8 mg/dL — ABNORMAL HIGH (ref <1.0)

## 2019-10-30 LAB — LACTIC ACID, PLASMA: Lactic Acid, Venous: 1 mmol/L (ref 0.5–1.9)

## 2019-10-30 LAB — ECHOCARDIOGRAM LIMITED
Height: 69 in
Weight: 2880 oz

## 2019-10-30 LAB — PROCALCITONIN: Procalcitonin: <0.1 ng/mL

## 2019-10-30 LAB — FIBRINOGEN: Fibrinogen: 665 mg/dL — ABNORMAL HIGH (ref 210–475)

## 2019-10-30 LAB — D-DIMER, QUANTITATIVE: D-Dimer, Quant: 1.5 ug/mL-FEU — ABNORMAL HIGH (ref 0.00–0.50)

## 2019-10-30 LAB — BRAIN NATRIURETIC PEPTIDE: B Natriuretic Peptide: 2702.2 pg/mL — ABNORMAL HIGH (ref 0.0–100.0)

## 2019-10-30 MED ORDER — AMLODIPINE BESYLATE 5 MG PO TABS
10.0000 mg | ORAL_TABLET | Freq: Every day | ORAL | Status: DC
  Administered 2019-10-30 – 2019-11-05 (×7): 10 mg via ORAL
  Filled 2019-10-30 (×7): qty 2

## 2019-10-30 MED ORDER — ACETAMINOPHEN 325 MG PO TABS
650.0000 mg | ORAL_TABLET | Freq: Four times a day (QID) | ORAL | Status: DC | PRN
  Administered 2019-10-30: 650 mg via ORAL
  Filled 2019-10-30: qty 2

## 2019-10-30 MED ORDER — SODIUM CHLORIDE 0.9 % IV SOLN: 100.0000 mg | Freq: Every day | INTRAVENOUS | Status: DC

## 2019-10-30 MED ORDER — CLONIDINE HCL 0.1 MG PO TABS
0.1000 mg | ORAL_TABLET | Freq: Three times a day (TID) | ORAL | Status: DC
  Administered 2019-10-30 – 2019-11-05 (×18): 0.1 mg via ORAL
  Filled 2019-10-30 (×19): qty 1

## 2019-10-30 MED ORDER — PREDNISONE 5 MG PO TABS: 5.0000 mg | ORAL_TABLET | Freq: Every day | ORAL | Status: DC

## 2019-10-30 MED ORDER — FINASTERIDE 5 MG PO TABS
5.0000 mg | ORAL_TABLET | Freq: Every day | ORAL | Status: DC
  Administered 2019-10-31 – 2019-11-04 (×5): 5 mg via ORAL
  Filled 2019-10-30 (×8): qty 1

## 2019-10-30 MED ORDER — ACETAMINOPHEN 650 MG RE SUPP: 650.0000 mg | Freq: Four times a day (QID) | RECTAL | Status: DC | PRN

## 2019-10-30 MED ORDER — METHYLPREDNISOLONE SODIUM SUCC 40 MG IJ SOLR
40.0000 mg | Freq: Two times a day (BID) | INTRAMUSCULAR | Status: DC
  Administered 2019-10-30 – 2019-11-01 (×4): 40 mg via INTRAVENOUS
  Filled 2019-10-30 (×4): qty 1

## 2019-10-30 MED ORDER — DM-GUAIFENESIN ER 30-600 MG PO TB12
1.0000 | ORAL_TABLET | Freq: Two times a day (BID) | ORAL | Status: DC
  Administered 2019-10-30 – 2019-11-03 (×8): 1 via ORAL
  Filled 2019-10-30 (×8): qty 1

## 2019-10-30 MED ORDER — ENOXAPARIN SODIUM 40 MG/0.4ML ~~LOC~~ SOLN: 40.0000 mg | SUBCUTANEOUS | Status: DC

## 2019-10-30 MED ORDER — ONDANSETRON HCL 4 MG/2ML IJ SOLN
4.0000 mg | Freq: Once | INTRAMUSCULAR | Status: AC
  Administered 2019-10-30: 4 mg via INTRAVENOUS
  Filled 2019-10-30: qty 2

## 2019-10-30 MED ORDER — LABETALOL HCL 100 MG PO TABS
400.0000 mg | ORAL_TABLET | Freq: Two times a day (BID) | ORAL | Status: DC
  Administered 2019-10-30 – 2019-11-05 (×12): 400 mg via ORAL
  Filled 2019-10-30 (×4): qty 4
  Filled 2019-10-30: qty 2
  Filled 2019-10-30 (×7): qty 4

## 2019-10-30 MED ORDER — SODIUM CHLORIDE 0.9 % IV SOLN
200.0000 mg | Freq: Once | INTRAVENOUS | Status: AC
  Administered 2019-10-30: 200 mg via INTRAVENOUS
  Filled 2019-10-30: qty 40

## 2019-10-30 MED ORDER — FUROSEMIDE 10 MG/ML IJ SOLN
40.0000 mg | INTRAMUSCULAR | Status: AC
  Administered 2019-10-30: 40 mg via INTRAVENOUS
  Filled 2019-10-30: qty 4

## 2019-10-30 MED ORDER — TAMSULOSIN HCL 0.4 MG PO CAPS
0.8000 mg | ORAL_CAPSULE | Freq: Every day | ORAL | Status: DC
  Administered 2019-10-30 – 2019-11-05 (×7): 0.8 mg via ORAL
  Filled 2019-10-30 (×7): qty 2

## 2019-10-30 MED ORDER — HEPARIN SODIUM (PORCINE) 5000 UNIT/ML IJ SOLN
5000.0000 [IU] | Freq: Three times a day (TID) | INTRAMUSCULAR | Status: DC
  Administered 2019-10-30 – 2019-11-05 (×17): 5000 [IU] via SUBCUTANEOUS
  Filled 2019-10-30 (×17): qty 1

## 2019-10-30 MED ORDER — SODIUM CHLORIDE 0.9% FLUSH
3.0000 mL | Freq: Two times a day (BID) | INTRAVENOUS | Status: DC
  Administered 2019-10-31 – 2019-11-05 (×10): 3 mL via INTRAVENOUS

## 2019-10-30 NOTE — ED Triage Notes (Addendum)
Pt presents to the ED with shortness of breath and feet swelling that has progressively worsened since he was diagnosed with COVID-19 about two weeks ago. Pt arrives alert and oriented x4. 91% on room air. Denying any pain.   Pt reports he took robitussin, 2 tylenol and and albuterol inhaler this morning.   Pt does not have a PCP to follow up with he says.

## 2019-10-30 NOTE — ED Provider Notes (Signed)
[facility] EMERGENCY DEPARTMENT Provider Note   CSN: [id number] Arrival date & time: 10/30/19  4097     History Chief Complaint  Patient presents with  . Foot Swelling  . Shortness of Breath    Alan Hawkins is a 79 y.o. male.  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  Patient tested positive for COVID-19 approximately 2 weeks ago.  Since then, he has had a persistent cough and progressively worsening shortness of breath.  He notes some associated chest tightness that gets better when he uses his inhaler.  He has also had progressively worsening bilateral lower extremity edema.  He has been receiving outpatient MAB infusions for COVID without improvement. Had 1 episode of diarrhea previously, no ongoing diarrhea and no vomiting.  The history is provided by the patient.  Shortness of Breath      Past Medical History:  Diagnosis Date  . AAA (abdominal aortic aneurysm) ([place])   . Acute lacunar stroke ([place]) 08/22/2016  . Anemia    iron infusions with dialysis occasionally  . Arthritis   . Cancer ([place]) [id number]   basal cell carcinoma right ear  . Cerebrovascular disease 08/22/2016  . CHF (congestive heart failure) ([place])   . CKD (chronic kidney disease) stage 3, GFR 30-59 ml/min    hd mwf - started jan 2015  . Essential hypertension, benign   . GERD (gastroesophageal reflux disease)   . Heart murmur   . Hyperlipidemia     Patient Active Problem List   Diagnosis Date Noted  . Lab test positive for detection of COVID-19 virus 10/20/2019  . Acute lacunar stroke ([place]) 08/22/2016  . Essential hypertension 08/22/2016  . Cerebrovascular disease 08/22/2016  . Chronic kidney disease (CKD), stage IV (severe) ([place]) 08/22/2016  . Current chronic use of systemic steroids 08/22/2016  . Hyperlipidemia 08/22/2016  . Abdominal aortic aneurysm without rupture ([place]) 08/04/2014  . AAA  (abdominal aortic aneurysm) without rupture ([place]) 07/15/2014  . Ischemic cardiomyopathy 09/20/2013  . Acute on chronic systolic heart failure ([place]) 09/20/2013  . Acute on chronic renal failure ([place]) 09/20/2013    Past Surgical History:  Procedure Laterality Date  . ABDOMINAL AORTIC ENDOVASCULAR STENT GRAFT Bilateral 08/04/2014   Procedure: ABDOMINAL AORTIC ENDOVASCULAR STENT GRAFT;  Surgeon: Rosetta Posner, MD;  Location: [facility];  Service: Vascular;  Laterality: Bilateral;  . AV FISTULA PLACEMENT Left   . CATARACT EXTRACTION W/PHACO  11/17/2011   Procedure: CATARACT EXTRACTION PHACO AND INTRAOCULAR LENS PLACEMENT (IOC);  Surgeon: Tonny Branch, MD;  Location: [facility];  Service: Ophthalmology;  Laterality: Right;  CDE=16.17  . CATARACT EXTRACTION W/PHACO  12/05/2011   Procedure: CATARACT EXTRACTION PHACO AND INTRAOCULAR LENS PLACEMENT (IOC);  Surgeon: Tonny Branch, MD;  Location: [facility];  Service: Ophthalmology;  Laterality: Left;  CDE 16.22  . COLONOSCOPY N/A 05/20/2014   Procedure: COLONOSCOPY;  Surgeon: Rogene Houston, MD;  Location[facility];  Service: Endoscopy;  Laterality: N/A;  730  . excision of basal cell carcinoma right ear Right 05-2014  . EYE SURGERY     R KPE w/ IOL  . INSERTION OF DIALYSIS CATHETER Right 10/15/2013   removed  . KIDNEY TRANSPLANT Right 01-08-2015   WAKE f[facility]  . VASECTOMY         Family History  Problem Relation Age of Onset  . Heart disease Mother   . Cancer Mother   . Heart  disease Sister   . Heart attack Sister   . Stroke Father   . Hypertension Son     Social History   Tobacco Use  . Smoking status: Former Smoker    Types: Cigarettes  . Smokeless tobacco: Never Used  . Tobacco comment: "Stopped smoking at least 35-40 years ago"  Substance Use Topics  . Alcohol use: No    Alcohol/week: 0.0 standard drinks    Comment: Occasional monthly beer  . Drug use: No    Home Medications Prior to Admission medications   Medication Sig Start  Date End Date Taking? Authorizing Provider  acetaminophen (TYLENOL) 500 MG tablet Take 500-1,000 mg by mouth every 6 (six) hours as needed for moderate pain or headache.   Yes [provider]  albuterol (VENTOLIN HFA) 108 (90 Base) MCG/ACT inhaler Inhale 2 puffs into the lungs every 6 (six) hours as needed for wheezing or shortness of breath.   Yes [provider]  amLODipine (NORVASC) 10 MG tablet Take 10 mg by mouth daily.    Yes [provider]  aspirin EC 81 MG tablet Take 2 tablets (162 mg total) by mouth daily. Patient taking differently: Take 81 mg by mouth 2 (two) times daily.  08/22/16  Yes Rexene Alberts, MD  cetirizine (ZYRTEC) 10 MG tablet Take 10 mg by mouth at bedtime.   Yes [provider]  cloNIDine (CATAPRES) 0.1 MG tablet Take 0.1 mg by mouth 3 (three) times daily.    Yes [provider]  dextromethorphan-guaiFENesin (MUCINEX DM) 30-600 MG 12hr tablet Take 1 tablet by mouth 2 (two) times daily.   Yes [provider]  docusate sodium (COLACE) 100 MG capsule Take 100 mg by mouth 2 (two) times daily.   Yes [provider]  finasteride (PROSCAR) 5 MG tablet Take 5 mg by mouth at bedtime.  08/02/19  Yes [provider]  fluticasone (FLONASE) 50 MCG/ACT nasal spray Place 1 spray into both nostrils daily as needed.    Yes [provider]  labetalol (NORMODYNE) 200 MG tablet Take 400 mg by mouth 2 (two) times daily.    Yes [provider]  mycophenolate (MYFORTIC) 180 MG EC tablet Take 180 mg by mouth 2 (two) times daily.   Yes [provider]  Polyethyl Glycol-Propyl Glycol (SYSTANE OP) Apply 1 drop to eye daily as needed (dry eyes).    Yes [provider]  polyethylene glycol (MIRALAX) packet Take 17 g by mouth daily. Take 17 g by mouth daily as needed (Constipation). 11/19/13  Yes [provider]  predniSONE (DELTASONE) 5 MG tablet Take 5 mg by mouth daily with breakfast.    Yes [provider]  ranitidine (ZANTAC) 150 MG tablet Take 150 mg by mouth 2 (two) times daily.    Yes [provider]  tacrolimus (PROGRAF) 0.5 MG capsule Take 3.5 mg by mouth 2 (two) times daily.    Yes [provider]  tamsulosin (FLOMAX) 0.4 MG CAPS capsule Take 0.8 mg by mouth daily.  08/21/16  Yes [provider]    Allergies    Bee venom, Nitrofuran derivatives, Nitroglycerin, and Tape  Review of Systems   Review of Systems  Respiratory: Positive for shortness of breath.    All other systems reviewed and are negative except that which was mentioned in HPI  Physical Exam Updated Vital Signs BP (!) 164/96   Pulse (!) 106   Temp 97.6 F (36.4 C) (Oral)   Resp (!) 23  Ht 5\' 9"  (1.753 m)   Wt 81.6 kg   SpO2 99%   BMI 26.58 kg/m   Physical Exam Vitals and nursing note reviewed.  Constitutional:      General: He is not in acute distress.    Appearance: He is well-developed.  HENT:     Head: Normocephalic and atraumatic.  Eyes:     Conjunctiva/sclera: Conjunctivae normal.  Cardiovascular:     Rate and Rhythm: Normal rate and regular rhythm.     Heart sounds: Normal heart sounds. No murmur.  Pulmonary:     Effort: No accessory muscle usage or respiratory distress.     Breath sounds: Rales present.     Comments: Mild dyspnea but able to speak in full sentences, rales b/l Abdominal:     General: Bowel sounds are normal. There is no distension.     Palpations: Abdomen is soft.     Tenderness: There is no abdominal tenderness.  Musculoskeletal:     Cervical back: Neck supple.     Right lower leg: Edema present.     Left lower leg: Edema present.     Comments: 2+ pitting edema BLE  Skin:    General: Skin is warm and dry.  Neurological:     Mental Status: He is alert and oriented to person, place, and time.     Comments: Fluent speech  Psychiatric:        Mood and Affect: Mood normal.        Judgment: Judgment normal.      ED Results / Procedures / Treatments   Labs (all labs ordered are listed, but only abnormal results are displayed) Labs Reviewed  CBC WITH DIFFERENTIAL/PLATELET - Abnormal; Notable for the following components:      Result Value   RBC 3.98 (*)    Hemoglobin 12.1 (*)    HCT 37.9 (*)    Lymphs Abs 0.5 (*)    All other components within normal limits  BRAIN NATRIURETIC PEPTIDE - Abnormal; Notable for the following components:   B Natriuretic Peptide 2,702.2 (*)    All other components within normal limits  COMPREHENSIVE METABOLIC PANEL - Abnormal; Notable for the following components:   Potassium 5.6 (*)    CO2 19 (*)    Glucose, Bld 147 (*)    BUN 38 (*)    Creatinine, Ser 2.52 (*)    Total Protein 6.4 (*)    Albumin 2.8 (*)    ALT 47 (*)    GFR calc non Af Amer 23 (*)    GFR calc Af Amer 27 (*)    All other components within normal limits  LACTIC ACID, PLASMA  TACROLIMUS LEVEL  LACTIC ACID, PLASMA    EKG EKG Interpretation  Date/Time:  Wednesday October 30 2019 09:25:21 EST Ventricular Rate:  99 PR Interval:    QRS Duration: 173 QT Interval:  397 QTC Calculation: 510 R Axis:   137 Text Interpretation: Unknown rhythm, irregular rate Right bundle branch block Lateral infarct, age indeterminate Anteroseptal infarct, age indeterminate Baseline wander in lead(s) V2 Interpretation limited secondary to artifact Confirmed by Theotis Burrow [phone]) on 10/30/2019 9:41:02 AM   Radiology DG Chest Port 1 View  Result Date: 10/30/2019 CLINICAL DATA:  Worsening shortness of breath, history of COVID-19 positivity EXAM: PORTABLE CHEST 1 VIEW COMPARISON:  10/19/2019 FINDINGS: Cardiac shadow is stable. Patchy opacities are again identified throughout both lungs but increased particularly in the right base and right upper lobe with some mild volume  loss in the right upper lobe. These changes are consistent with the given clinical history. No bony abnormality is noted. IMPRESSION:  Increasing opacities bilaterally particularly on the right consistent with the given clinical history. Electronically Signed   By: Inez Catalina M.D.   On: 10/30/2019 10:55    Procedures Procedures (including critical care time)  Medications Ordered in ED Medications  ondansetron (ZOFRAN) injection 4 mg (4 mg Intravenous Given 10/30/19 1140)  furosemide (LASIX) injection 40 mg (40 mg Intravenous Given 10/30/19 1323)    ED Course  I have reviewed the triage vital signs and the nursing notes.  Pertinent labs & imaging results that were available during my care of the patient were reviewed by me and considered in my medical decision making (see chart for details).    MDM Rules/Calculators/A&P                      Hypoxic to 86% on RA, placed on 3L [place].  Chest x-ray shows worsening bilateral opacities consistent with COVID-19.  Labs notable for potassium 5.6, creatinine 2.5 which is slightly worse than baseline, BNP 2700.  Gave IV Lasix to help with hyperkalemia as well as volume overload.  Lactate is normal and normal WBC count.  Discussed admission with internal medicine teaching service who will admit for further care.  Patient prefers to stay in [place] if possible as opposed to transfer to [facility].  Regarding code discussion, pt has stated he would agree to intubation but no CPR (DNR but not DNI).   TRENTYN BOISCLAIR was evaluated in Emergency Department on 10/30/2019 for the symptoms described in the history of present illness. He was evaluated in the context of the global COVID-19 pandemic, which necessitated consideration that the patient might be at risk for infection with the SARS-CoV-2 virus that causes COVID-19. Institutional protocols and algorithms that pertain to the evaluation of patients at risk for COVID-19 are in a state of rapid change based on information released by regulatory bodies including the CDC and federal and state organizations. These policies and algorithms were  followed during the patient's care in the ED.  Final Clinical Impression(s) / ED Diagnoses Final diagnoses:  Acute hypoxemic respiratory failure due to COVID-19 [facility])  Acute renal failure superimposed on chronic kidney disease, unspecified CKD stage, unspecified acute renal failure type [facility])    Rx / DC Orders ED Discharge Orders    None       Almira Phetteplace, Wenda Overland, MD 10/30/19 1342

## 2019-10-30 NOTE — ED Notes (Signed)
Hooked patient up to the monitor did ekg shown to er doctor patient is resting with call bell in reach

## 2019-10-30 NOTE — Progress Notes (Signed)
  Echocardiogram 2D Echocardiogram has been performed.  Alan Hawkins 10/30/2019, 4:22 PM

## 2019-10-30 NOTE — H&P (Signed)
Date: 10/30/2019               Patient Name:  Alan Hawkins MRN: [id number]  DOB: [birth year] Age / Sex: 79 y.o., male   PCP: Alan Area, MD         Medical Service: Internal Medicine Teaching Service         Attending Physician: Dr. Lucious Groves, DO    First Contact: Dr. Madilyn Hawkins Pager: [phone]  Second Contact: Dr. Eileen Hawkins Pager: [phone]       After Hours (After 5p/  First Contact Pager: [phone]  weekends / holidays): Second Contact Pager: [phone]   Chief Complaint: shortness of breath  History of Present Illness: Alan Hawkins is a 79 yo M w/ a PMHx of immunosuppesion s/p renal transplant, CHF (EF 55-60% in 2017), CAD, CVA and AAA in setting of recent COVID-19 diagnosis who has had persistent cough and progressively worsening SOB despite receiving outpatient bamlanivimab infusions. He reports that he has had progressively worsening SOB since his COVID diagnosis. He reports that with outpatient improvements he has felt some improvement some days and then he worse other days. He denies fever but endorses extreme fatigue. He has not had a sore throat but does endorse rhinorrhea. He has had some intermittent chest pain that is not associated with exertion. He describes it as feeling like he has gas in his chest or indigestion. He reports increasing SOB and cough since his diagnosis. He reports some chronic lower abdominal pain, new onset nausea and an episode of diarrhea over a week ago. He denies falls, myalgias and headache. He is somewhat anxious about having COVID.  Meds:  Current Meds  Medication Sig  . acetaminophen (TYLENOL) 500 MG tablet Take 500-1,000 mg by mouth every 6 (six) hours as needed for moderate pain or headache.  . albuterol (VENTOLIN HFA) 108 (90 Base) MCG/ACT inhaler Inhale 2 puffs into the lungs every 6 (six) hours as needed for wheezing or shortness of breath.  Marland Kitchen amLODipine (NORVASC) 10 MG tablet Take 10 mg by mouth daily.   Marland Kitchen aspirin EC 81 MG tablet Take 2  tablets (162 mg total) by mouth daily. (Patient taking differently: Take 81 mg by mouth 2 (two) times daily. )  . cetirizine (ZYRTEC) 10 MG tablet Take 10 mg by mouth at bedtime.  . cloNIDine (CATAPRES) 0.1 MG tablet Take 0.1 mg by mouth 3 (three) times daily.   Marland Kitchen dextromethorphan-guaiFENesin (MUCINEX DM) 30-600 MG 12hr tablet Take 1 tablet by mouth 2 (two) times daily.  Marland Kitchen docusate sodium (COLACE) 100 MG capsule Take 100 mg by mouth 2 (two) times daily.  . finasteride (PROSCAR) 5 MG tablet Take 5 mg by mouth at bedtime.   . fluticasone (FLONASE) 50 MCG/ACT nasal spray Place 1 spray into both nostrils daily as needed.   . labetalol (NORMODYNE) 200 MG tablet Take 400 mg by mouth 2 (two) times daily.   . mycophenolate (MYFORTIC) 180 MG EC tablet Take 180 mg by mouth 2 (two) times daily.  Vladimir Faster Glycol-Propyl Glycol (SYSTANE OP) Apply 1 drop to eye daily as needed (dry eyes).   . polyethylene glycol (MIRALAX) packet Take 17 g by mouth daily. Take 17 g by mouth daily as needed (Constipation).  . predniSONE (DELTASONE) 5 MG tablet Take 5 mg by mouth daily with breakfast.  . ranitidine (ZANTAC) 150 MG tablet Take 150 mg by mouth 2 (two) times daily.   . tacrolimus (PROGRAF) 0.5 MG capsule Take 3.5  mg by mouth 2 (two) times daily.   . tamsulosin (FLOMAX) 0.4 MG CAPS capsule Take 0.8 mg by mouth daily.    Allergies: Allergies as of 10/30/2019 - Review Complete 10/30/2019  Allergen Reaction Noted  . Bee venom Anaphylaxis 09/20/2013  . Nitrofuran derivatives Nausea And Vomiting 09/22/2013  . Nitroglycerin Other (See Comments) 11/06/2013  . Tape Itching and Rash 07/22/2014   Past Medical History:  Diagnosis Date  . AAA (abdominal aortic aneurysm) ([place])   . Acute lacunar stroke ([place]) 08/22/2016  . Anemia    iron infusions with dialysis occasionally  . Arthritis   . Cancer ([place]) [phone]   basal cell carcinoma right ear  . Cerebrovascular disease 08/22/2016  . CHF (congestive heart failure)  ([place])   . CKD (chronic kidney disease) stage 3, GFR 30-59 ml/min    hd mwf - started jan 2015  . Essential hypertension, benign   . GERD (gastroesophageal reflux disease)   . Heart murmur   . Hyperlipidemia    Family History:  Family History  Problem Relation Age of Onset  . Heart disease Mother   . Cancer Mother   . Heart disease Sister   . Heart attack Sister   . Stroke Father   . Hypertension Son    Social History:  Social History   Tobacco Use  . Smoking status: Former Smoker    Types: Cigarettes  . Smokeless tobacco: Never Used  . Tobacco comment: "Stopped smoking at least 35-40 years ago"  Substance Use Topics  . Alcohol use: No    Alcohol/week: 0.0 standard drinks    Comment: Occasional monthly beer  . Drug use: No  Former smoke > 40 years ago Has not used alcohol since kidney transplant > 5 years ago Lives at home with wife who tested negative for COVID  Review of Systems: A complete ROS was negative except as per HPI.   Physical Exam: Blood pressure (!) 154/89, pulse 99, temperature 97.6 F (36.4 C), temperature source Oral, resp. rate (!) 24, height 5\' 9"  (1.753 m), weight 81.6 kg, SpO2 97 %.  Physical Exam  Constitutional:  Elderly and diaphoretic appearing male  HENT:  Head: Normocephalic.  Cardiovascular: Regular rhythm.  No murmur heard. Tachy; 1+ LE edema  Pulmonary/Chest:  Coughing, tachypneic with diffuse wet and coarse breath sounds   Abdominal: Soft. Bowel sounds are normal. There is no abdominal tenderness.  Musculoskeletal:     Cervical back: Normal range of motion.  Neurological: He is alert.  Skin: Skin is warm. No rash noted. He is diaphoretic. No erythema.  Psychiatric: Affect normal.  Nursing note and vitals reviewed.  Labs: Results for orders placed or performed during the hospital encounter of 10/30/19 (from the past 24 hour(s))  CBC with Differential     Status: Abnormal   Collection Time: 10/30/19  9:48 AM  Result Value Ref  Range   WBC 7.9 4.0 - 10.5 K/uL   RBC 3.98 (L) 4.22 - 5.81 MIL/uL   Hemoglobin 12.1 (L) 13.0 - 17.0 g/dL   HCT 37.9 (L) 39.0 - 52.0 %   MCV 95.2 80.0 - 100.0 fL   MCH 30.4 26.0 - 34.0 pg   MCHC 31.9 30.0 - 36.0 g/dL   RDW 13.1 11.5 - 15.5 %   Platelets 195 150 - 400 K/uL   nRBC 0.0 0.0 - 0.2 %   Neutrophils Relative % 76 %   Neutro Abs 6.0 1.7 - 7.7 K/uL   Lymphocytes Relative 6 %  Lymphs Abs 0.5 (L) 0.7 - 4.0 K/uL   Monocytes Relative 13 %   Monocytes Absolute 1.0 0.1 - 1.0 K/uL   Eosinophils Relative 4 %   Eosinophils Absolute 0.3 0.0 - 0.5 K/uL   Basophils Relative 0 %   Basophils Absolute 0.0 0.0 - 0.1 K/uL   Immature Granulocytes 1 %   Abs Immature Granulocytes 0.04 0.00 - 0.07 K/uL  Brain natriuretic peptide     Status: Abnormal   Collection Time: 10/30/19 10:34 AM  Result Value Ref Range   B Natriuretic Peptide 2,702.2 (H) 0.0 - 100.0 pg/mL  Lactic acid, plasma     Status: None   Collection Time: 10/30/19 10:34 AM  Result Value Ref Range   Lactic Acid, Venous 1.0 0.5 - 1.9 mmol/L  Comprehensive metabolic panel     Status: Abnormal   Collection Time: 10/30/19 11:31 AM  Result Value Ref Range   Sodium 137 135 - 145 mmol/L   Potassium 5.6 (H) 3.5 - 5.1 mmol/L   Chloride 108 98 - 111 mmol/L   CO2 19 (L) 22 - 32 mmol/L   Glucose, Bld 147 (H) 70 - 99 mg/dL   BUN 38 (H) 8 - 23 mg/dL   Creatinine, Ser 2.52 (H) 0.61 - 1.24 mg/dL   Calcium 9.6 8.9 - 10.3 mg/dL   Total Protein 6.4 (L) 6.5 - 8.1 g/dL   Albumin 2.8 (L) 3.5 - 5.0 g/dL   AST 31 15 - 41 U/L   ALT 47 (H) 0 - 44 U/L   Alkaline Phosphatase 83 38 - 126 U/L   Total Bilirubin 0.9 0.3 - 1.2 mg/dL   GFR calc non Af Amer 23 (L) >60 mL/min   GFR calc Af Amer 27 (L) >60 mL/min   Anion gap 10 5 - 15    EKG: personally reviewed my interpretation is no acute ischemic changes.  CXR: personally reviewed my interpretation is bilateral patchy opacities concerning for infectious etiology.  DG Chest Port 1 View  Result  Date: 10/30/2019 CLINICAL DATA:  Worsening shortness of breath, history of COVID-19 positivity EXAM: PORTABLE CHEST 1 VIEW COMPARISON:  10/19/2019 FINDINGS: Cardiac shadow is stable. Patchy opacities are again identified throughout both lungs but increased particularly in the right base and right upper lobe with some mild volume loss in the right upper lobe. These changes are consistent with the given clinical history. No bony abnormality is noted. IMPRESSION: Increasing opacities bilaterally particularly on the right consistent with the given clinical history. Electronically Signed   By: Inez Catalina M.D.   On: 10/30/2019 10:55   Assessment:  Mr. Pagliarulo is a 79 yo M w/ a PMHx of immunosuppesion s/p renal transplant, CHF (EF 55-60% in 2017), CAD, CVA and AAA in setting of recent COVID-19 diagnosis who has had persistent cough and progressively worsening SOB despite receiving outpatient bamlanivimab infusions who presents with tachycardia and tachypnea, satting 96% on 4L, an elevated BNP (2702.02), a normal lactic acid and WBC, an elevated K to 5.6 and an elevated Creatinine of 2.52 with x-ray concerning for bilateral pneumonia whose picture is concerning for acute hypoxic respiratory failure due to COVID with a possible acute on chronic heart failure exacerbation.  Plan by Problem:  Acute hypoxic respiratory failure/COVID: Mr. Hevia is a 79 yo M w/ a recent COVID-19 diagnosis who has had persistent cough and progressively worsening SOB despite receiving outpatient MAB infusions who presents with tachycardia and tachypnea, satting 96% on 4L, an elevated BNP (2702.02), a normal  lactic acid and WBC and an x-ray concerning for bilateral pneumonia whose picture is concerning for acute hypoxic respiratory failure due to COVID  -x-ray appears more consistent with pneumonia than volume overload -patient has LE edema but no JVD -lungs wet and coarse diffusely -BNP concerning for HF exacerbation but unlikely  that this is the primary driver of his sx -[facility] has accepted pt  Plan: -transfer to [facility] -solumedrol 0.5mg /kg BID -remdesivir per pharmacy -fu CRP, D-Dimer, fibrinogen, lactate dehydrogenase, ferritin  -strict I/Os  S/p Renal Transplant: Acute on CKD: -pt w/ hx of of CKD s/p renal transplant on immunosuppressive therapy  -baseline Creatinine around 2 per patient, 2.23 11 days ago, 2.52 today -unclear of baseline Creatinine -may be prerenal in etiology if an AKI as patient acutely sick and had decreased po -no concerns for rejection as most sx consistent with known COVID infection  Plan: -strict I/Os -monitor BMP -holding immunosuppressing medications, mycophenolate, tacrolimus and prednisone   -restart as indicated in acute COVID  Ischemic Cardiomyopathy/ HFpEF/CHF: -pt w/ known HFpEF here with progressive SOB in setting of COVID infection -has had some increased LE edema but no JVD on exam -x-ray most consistent with infectious etiology vs. Edema -received dose of diuretic in the ED  Plan: -TTE  CAD: -pt w/ hx of CAD here with progressive SOB in setting of COVID infection -had some intermittent chest tightness not concerning for angina; not associated with exertion; described as a tightness -EKG without acute ischemic changes  Plan -continue medical therapy below  CVA: -hx of CVA, not currently taking ASA  Plan: -continue medical therapy below  AAA: -pt w/ hx AAA without rupture, unknown last [place], not currently on ASA  Plan: -continue medical therapy below  HTN: -pt w/ hx essential hypertension -bp elevated in ED up to 164/96 in setting of having not taken his morning medications  Plan: -continue home amlodipine, clonidine and labetolol  HLD: -no statin; interaction w/ tacrolimus   BPH: -continue home finasteride and tamsulosin  Principal Problem:   Pneumonia due to COVID-19 virus   Dispo: Admit patient to Inpatient with expected length of  stay greater than 2 midnights.  Signed: Al Decant, MD 10/30/2019, 3:56 PM  Pager: [phone]

## 2019-10-31 ENCOUNTER — Encounter (HOSPITAL_COMMUNITY): Payer: Self-pay

## 2019-10-31 DIAGNOSIS — J9601 Acute respiratory failure with hypoxia: Secondary | ICD-10-CM

## 2019-10-31 DIAGNOSIS — N179 Acute kidney failure, unspecified: Secondary | ICD-10-CM

## 2019-10-31 DIAGNOSIS — I10 Essential (primary) hypertension: Secondary | ICD-10-CM

## 2019-10-31 DIAGNOSIS — N189 Chronic kidney disease, unspecified: Secondary | ICD-10-CM

## 2019-10-31 DIAGNOSIS — J1282 Pneumonia due to coronavirus disease 2019: Secondary | ICD-10-CM

## 2019-10-31 DIAGNOSIS — U071 COVID-19: Principal | ICD-10-CM

## 2019-10-31 LAB — HEPATIC FUNCTION PANEL
ALT: 41 U/L (ref 0–44)
AST: 29 U/L (ref 15–41)
Albumin: 2.8 g/dL — ABNORMAL LOW (ref 3.5–5.0)
Alkaline Phosphatase: 85 U/L (ref 38–126)
Bilirubin, Direct: 0.4 mg/dL — ABNORMAL HIGH (ref 0.0–0.2)
Indirect Bilirubin: 0.9 mg/dL (ref 0.3–0.9)
Total Bilirubin: 1.3 mg/dL — ABNORMAL HIGH (ref 0.3–1.2)
Total Protein: 6.3 g/dL — ABNORMAL LOW (ref 6.5–8.1)

## 2019-10-31 LAB — BASIC METABOLIC PANEL
Anion gap: 15 (ref 5–15)
Anion gap: 10 (ref 5–15)
BUN: 46 mg/dL — ABNORMAL HIGH (ref 8–23)
BUN: 57 mg/dL — ABNORMAL HIGH (ref 8–23)
CO2: 20 mmol/L — ABNORMAL LOW (ref 22–32)
CO2: 23 mmol/L (ref 22–32)
Calcium: 9.6 mg/dL (ref 8.9–10.3)
Calcium: 9.4 mg/dL (ref 8.9–10.3)
Chloride: 102 mmol/L (ref 98–111)
Chloride: 102 mmol/L (ref 98–111)
Creatinine, Ser: 2.32 mg/dL — ABNORMAL HIGH (ref 0.61–1.24)
Creatinine, Ser: 2.42 mg/dL — ABNORMAL HIGH (ref 0.61–1.24)
GFR calc Af Amer: 30 mL/min — ABNORMAL LOW (ref >60)
GFR calc Af Amer: 29 mL/min — ABNORMAL LOW (ref >60)
GFR calc non Af Amer: 26 mL/min — ABNORMAL LOW (ref >60)
GFR calc non Af Amer: 25 mL/min — ABNORMAL LOW (ref >60)
Glucose, Bld: 165 mg/dL — ABNORMAL HIGH (ref 70–99)
Glucose, Bld: 276 mg/dL — ABNORMAL HIGH (ref 70–99)
Potassium: 5.7 mmol/L — ABNORMAL HIGH (ref 3.5–5.1)
Potassium: 5.5 mmol/L — ABNORMAL HIGH (ref 3.5–5.1)
Sodium: 137 mmol/L (ref 135–145)
Sodium: 135 mmol/L (ref 135–145)

## 2019-10-31 LAB — MAGNESIUM: Magnesium: 2.1 mg/dL (ref 1.7–2.4)

## 2019-10-31 LAB — CBC
HCT: 38.8 % — ABNORMAL LOW (ref 39.0–52.0)
Hemoglobin: 12.2 g/dL — ABNORMAL LOW (ref 13.0–17.0)
MCH: 30 pg (ref 26.0–34.0)
MCHC: 31.4 g/dL (ref 30.0–36.0)
MCV: 95.3 fL (ref 80.0–100.0)
Platelets: 312 10*3/uL (ref 150–400)
RBC: 4.07 MIL/uL — ABNORMAL LOW (ref 4.22–5.81)
RDW: 13.1 % (ref 11.5–15.5)
WBC: 6 10*3/uL (ref 4.0–10.5)
nRBC: 0 % (ref 0.0–0.2)

## 2019-10-31 LAB — FERRITIN: Ferritin: 670 ng/mL — ABNORMAL HIGH (ref 24–336)

## 2019-10-31 LAB — C-REACTIVE PROTEIN: CRP: 10.7 mg/dL — ABNORMAL HIGH (ref <1.0)

## 2019-10-31 LAB — D-DIMER, QUANTITATIVE: D-Dimer, Quant: 1.47 ug/mL-FEU — ABNORMAL HIGH (ref 0.00–0.50)

## 2019-10-31 MED ORDER — ASPIRIN 81 MG PO CHEW
81.0000 mg | CHEWABLE_TABLET | Freq: Every day | ORAL | Status: DC
  Administered 2019-10-31 – 2019-11-05 (×6): 81 mg via ORAL
  Filled 2019-10-31 (×6): qty 1

## 2019-10-31 MED ORDER — SODIUM ZIRCONIUM CYCLOSILICATE 10 G PO PACK
10.0000 g | PACK | Freq: Every day | ORAL | Status: DC
  Administered 2019-10-31 – 2019-11-05 (×6): 10 g via ORAL
  Filled 2019-10-31 (×6): qty 1

## 2019-10-31 MED ORDER — TACROLIMUS 1 MG PO CAPS
3.5000 mg | ORAL_CAPSULE | Freq: Two times a day (BID) | ORAL | Status: DC
  Administered 2019-10-31 – 2019-11-02 (×4): 3.5 mg via ORAL
  Filled 2019-10-31 (×4): qty 1

## 2019-10-31 MED ORDER — SODIUM CHLORIDE 0.9 % IV SOLN
100.0000 mg | Freq: Every day | INTRAVENOUS | Status: AC
  Administered 2019-10-31 – 2019-11-03 (×4): 100 mg via INTRAVENOUS
  Filled 2019-10-31 (×4): qty 20

## 2019-10-31 MED ORDER — TACROLIMUS 1 MG PO CAPS
3.5000 mg | ORAL_CAPSULE | Freq: Two times a day (BID) | ORAL | Status: DC
  Filled 2019-10-31 (×2): qty 1

## 2019-10-31 MED ORDER — POLYVINYL ALCOHOL 1.4 % OP SOLN
1.0000 [drp] | OPHTHALMIC | Status: DC | PRN
  Filled 2019-10-31: qty 15

## 2019-10-31 MED ORDER — FAMOTIDINE 20 MG PO TABS
20.0000 mg | ORAL_TABLET | Freq: Every day | ORAL | Status: DC
  Administered 2019-10-31 – 2019-11-05 (×6): 20 mg via ORAL
  Filled 2019-10-31 (×6): qty 1

## 2019-10-31 MED ORDER — FLUTICASONE PROPIONATE 50 MCG/ACT NA SUSP
1.0000 | Freq: Every day | NASAL | Status: DC | PRN
  Administered 2019-11-03: 06:00:00 1 via NASAL
  Filled 2019-10-31 (×2): qty 16

## 2019-10-31 MED ORDER — POLYETHYLENE GLYCOL 3350 17 G PO PACK
17.0000 g | PACK | Freq: Every day | ORAL | Status: DC
  Administered 2019-11-02 – 2019-11-03 (×2): 17 g via ORAL
  Filled 2019-10-31 (×4): qty 1

## 2019-10-31 MED ORDER — POLYETHYL GLYCOL-PROPYL GLYCOL 0.4-0.3 % OP GEL: Freq: Every day | OPHTHALMIC | Status: DC | PRN

## 2019-10-31 MED ORDER — DEXTROSE 50 % IV SOLN
25.0000 mL | Freq: Once | INTRAVENOUS | Status: AC
  Administered 2019-10-31: 09:00:00 25 mL via INTRAVENOUS
  Filled 2019-10-31: qty 50

## 2019-10-31 MED ORDER — INSULIN ASPART 100 UNIT/ML IV SOLN
6.0000 [IU] | Freq: Once | INTRAVENOUS | Status: AC
  Administered 2019-10-31: 09:00:00 6 [IU] via INTRAVENOUS

## 2019-10-31 MED ORDER — MYCOPHENOLATE SODIUM 180 MG PO TBEC
180.0000 mg | DELAYED_RELEASE_TABLET | Freq: Two times a day (BID) | ORAL | Status: DC
  Administered 2019-10-31 – 2019-11-05 (×11): 180 mg via ORAL
  Filled 2019-10-31 (×12): qty 1

## 2019-10-31 NOTE — Progress Notes (Signed)
PROGRESS NOTE                                                                                                                                                                                                             Patient Demographics:    Alan Hawkins, is a 79 y.o. male, DOB - 1941/06/03, YJE:[id number]  Outpatient Primary MD for the patient is [name], [name], MD   Admit date - 10/30/2019   LOS - 1  Chief Complaint  Patient presents with  . Foot Swelling  . Shortness of Breath       Brief Narrative: Patient is a 79 y.o. 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   Subjective:    [name] today feels better-he has no swelling in his ankles today.  He was titrated down to 3 L of oxygen earlier this morning.   Assessment  & Plan :   Acute Hypoxic Resp Failure due to Covid 19 Viral pneumonia: Improved-oxygen titrate down to 3 L.  CRP still elevated.  Continue steroids and remdesivir.  Fever: afebrile  O2 requirements:  SpO2: 93 % O2 Flow Rate (L/min): 3 L/min   COVID-19 Labs: Recent Labs    10/30/19 1640 10/31/19 0558 10/31/19 0600 10/31/19 0700  DDIMER 1.50*  --   --  1.47*  FERRITIN  --  670*  --   --   LDH 186  --   --   --   CRP 7.8*  --  10.7*  --        Component Value Date/Time   BNP 2,702.2 (H) 10/30/2019 1034    Recent Labs  Lab 10/30/19 1613  PROCALCITON <0.10    Lab Results  Component Value Date   SARSCOV2NAA Detected (A) 10/15/2019     COVID-19 Medications: Steroids: 1/20>> Remdesivir: 1/20>> Bamlanivimab infusion: 1/11  Other medications: Diuretics:Euvolemic-no need for lasix-as of volume status has improved following Lasix  yesterday.  Prone/Incentive Spirometry: encouraged  incentive spirometry use 3-4/hour.  DVT Prophylaxis  :  Heparin   AKI on CKD stage IV-history of renal transplantation: Creatinine improved today-continue steroids-suspect should be able to resume usual immunosuppressive medications  given improvement in hypoxemia.  Hyperkalemia: Probably secondary to AKI-1 dose of IV insulin/D50-start Lokelma-repeat labs later today.  Chronic diastolic heart failure: Appears euvolemic-hold off on further diuretics for now.  History of CAD: No anginal symptoms-continue aspirin.  HTN: Controlled-continue amlodipine, clonidine, labetalol.  Follow and optimize.  BPH: Stable-continue Flomax and finasteride  Consults  :  None  Procedures  :  None  ABG:    Component Value Date/Time   PHART 7.437 07/29/2014 0905   PCO2ART 37.3 07/29/2014 0905   PO2ART 89.0 07/29/2014 0905   HCO3 24.8 (H) 07/29/2014 0905   TCO2 27 08/21/2016 1051   O2SAT 96.7 07/29/2014 0905    Vent Settings: N/A   Condition - Extremely Guarded  Family Communication  :  Spouse updated over the phone  Code Status :  Full Code  Diet :  Diet Order            Diet renal with fluid restriction Room service appropriate? Yes; Fluid consistency: Thin  Diet effective now               Disposition Plan  :  Remain hospitalized-suspect home with home health in the next few days once hypoxemia is better.  Barriers to discharge: Hypoxia requiring O2 supplementation/complete 5 days of IV Remdesivir  Antimicorbials  :    Anti-infectives (From admission, onward)   Start     Dose/Rate Route Frequency Ordered Stop   10/31/19 1600  remdesivir 100 mg in sodium chloride 0.9 % 100 mL IVPB  Status:  Discontinued     100 mg 200 mL/hr over 30 Minutes Intravenous Daily 10/30/19 1452 10/31/19 1242   10/31/19 1330  remdesivir 100 mg in sodium chloride 0.9 % 100 mL IVPB     100 mg 200 mL/hr over 30 Minutes Intravenous Daily 10/31/19 1242  11/04/19 0959   10/30/19 1600  remdesivir 200 mg in sodium chloride 0.9% 250 mL IVPB     200 mg 580 mL/hr over 30 Minutes Intravenous Once 10/30/19 1452 10/30/19 1654      Inpatient Medications  Scheduled Meds: . amLODipine  10 mg Oral Daily  . cloNIDine  0.1 mg Oral TID  . dextromethorphan-guaiFENesin  1 tablet Oral BID  . finasteride  5 mg Oral QHS  . heparin injection (subcutaneous)  5,000 Units Subcutaneous Q8H  . labetalol  400 mg Oral BID  . methylPREDNISolone (SOLU-MEDROL) injection  40 mg Intravenous Q12H  . sodium chloride flush  3 mL Intravenous Q12H  . sodium zirconium cyclosilicate  10 g Oral Daily  . tamsulosin  0.8 mg Oral Daily   Continuous Infusions: . remdesivir 100 mg in NS 100 mL     PRN Meds:.acetaminophen **OR** acetaminophen   Time Spent in minutes  25  See all Orders from today for further details   [name] M.D on 10/31/2019 at 12:56 PM  To page go to [email or web address] - use universal password  [facility] -  Office  [phone]    Objective:   Vitals:   10/31/19 0010 10/31/19 0400 10/31/19 0845 10/31/19 1202  BP: 135/75 (!) 150/81 (!) 149/101 (!) 142/81  Pulse: 85 85 96 99  Resp: 19 (!) 21 (!) 22 18  Temp: 98 F (36.7 C) 98.2 F (36.8 C) 97.6 F (36.4 C) 98.1 F (36.7 C)  TempSrc: Oral Oral Oral Oral  SpO2: 95% 94% 91% 93%  Weight:      Height:        Wt Readings from Last 3  Encounters:  10/30/19 81.6 kg  03/17/17 80.7 kg  08/21/16 80.7 kg     Intake/Output Summary (Last 24 hours) at 10/31/2019 1256 Last data filed at 10/31/2019 0400 Gross per 24 hour  Intake 250 ml  Output 1200 ml  Net -950 ml     Physical Exam Gen Exam:Alert awake-not in any distress HEENT:atraumatic, normocephalic Chest: B/L clear to auscultation anteriorly CVS:S1S2 regular Abdomen:soft non tender, non distended Extremities:trace edema Neurology: Non focal Skin: no rash   Data Review:    CBC Recent Labs  Lab 10/30/19 0948  10/31/19 0558  WBC 7.9 6.0  HGB 12.1* 12.2*  HCT 37.9* 38.8*  PLT 195 312  MCV 95.2 95.3  MCH 30.4 30.0  MCHC 31.9 31.4  RDW 13.1 13.1  LYMPHSABS 0.5*  --   MONOABS 1.0  --   EOSABS 0.3  --   BASOSABS 0.0  --     Chemistries  Recent Labs  Lab 10/30/19 1131 10/31/19 0558 10/31/19 0700  NA 137 137  --   K 5.6* 5.7*  --   CL 108 102  --   CO2 19* 20*  --   GLUCOSE 147* 165*  --   BUN 38* 46*  --   CREATININE 2.52* 2.32*  --   CALCIUM 9.6 9.6  --   MG  --  2.1  --   AST 31  --  29  ALT 47*  --  41  ALKPHOS 83  --  85  BILITOT 0.9  --  1.3*   ------------------------------------------------------------------------------------------------------------------ No results for input(s): CHOL, HDL, LDLCALC, TRIG, CHOLHDL, LDLDIRECT in the last 72 hours.  Lab Results  Component Value Date   HGBA1C 5.7 (H) 08/22/2016   ------------------------------------------------------------------------------------------------------------------ No results for input(s): TSH, T4TOTAL, T3FREE, THYROIDAB in the last 72 hours.  Invalid input(s): FREET3 ------------------------------------------------------------------------------------------------------------------ Recent Labs    10/31/19 0558  FERRITIN 670*    Coagulation profile No results for input(s): INR, PROTIME in the last 168 hours.  Recent Labs    10/30/19 1640 10/31/19 0700  DDIMER 1.50* 1.47*    Cardiac Enzymes No results for input(s): CKMB, TROPONINI, MYOGLOBIN in the last 168 hours.  Invalid input(s): CK ------------------------------------------------------------------------------------------------------------------    Component Value Date/Time   BNP 2,702.2 (H) 10/30/2019 1034    Micro Results No results found for this or any previous visit (from the past 240 hour(s)).  Radiology Reports DG Chest Port 1 View  Result Date: 10/30/2019 CLINICAL DATA:  Worsening shortness of breath, history of COVID-19  positivity EXAM: PORTABLE CHEST 1 VIEW COMPARISON:  10/19/2019 FINDINGS: Cardiac shadow is stable. Patchy opacities are again identified throughout both lungs but increased particularly in the right base and right upper lobe with some mild volume loss in the right upper lobe. These changes are consistent with the given clinical history. No bony abnormality is noted. IMPRESSION: Increasing opacities bilaterally particularly on the right consistent with the given clinical history. Electronically Signed   By: Inez Catalina M.D.   On: 10/30/2019 10:55   DG Chest Port 1 View  Result Date: 10/19/2019 CLINICAL DATA:  COVID-19 positive patient.  Worsening cough. EXAM: PORTABLE CHEST 1 VIEW COMPARISON:  August 04, 2014 FINDINGS: No pneumothorax. Suggested subtle peripheral patchy opacities. Cardiomegaly. Superimposed pulmonary venous congestion not excluded. No pneumothorax. IMPRESSION: 1. Subtle peripheral patchy opacities likely from the patient's COVID-19 status. 2. Cardiomegaly. Subtle pulmonary venous congestion is not excluded. Electronically Signed   By: Dorise Bullion III M.D   On: 10/19/2019 14:06  ECHOCARDIOGRAM LIMITED  Result Date: 10/30/2019   ECHOCARDIOGRAM LIMITED REPORT   Patient Name:   Alan Hawkins Date of Exam: 10/30/2019 Medical Rec #:  [id number]     Height:       69.0 in Accession #:    [id number]    Weight:       180.0 lb Date of Birth:  1941/06/20      BSA:          1.98 m Patient Age:    64 years      BP:           159/87 mmHg Patient Gender: M             HR:           97 bpm. Exam Location:  Inpatient  Procedure: Limited Echo, Limited Color Doppler and Cardiac Doppler Indications:    congestive heart failure 428.0  History:        Patient has prior history of Echocardiogram examinations, most                 recent 08/22/2016.  Sonographer:    Johny Chess Referring Phys: [id number] ERIK C HOFFMAN IMPRESSIONS  1. Left ventricular ejection fraction, by visual estimation, is 45 to 50%. The left  ventricle has mildly decreased function. There is mildly increased left ventricular wall thickness.  2. Global right ventricle has normal systolc function.The right ventricular size is normal. no increase in right ventricular wall thickness.  3. Mild mitral annular calcification.  4. The mitral valve is normal in structure. Mild mitral valve regurgitation. No evidence of mitral stenosis.  5. The tricuspid valve was normal in structure. Tricuspid valve regurgitation is mild.  6. Tricuspid valve regurgitation is mild.  7. Mild to moderate aortic valve sclerosis/calcification without any evidence of aortic stenosis.  8. The pulmonic valve was normal in structure.  9. Moderately elevated pulmonary artery systolic pressure. 10. The inferior vena cava is normal in size with greater than 50% respiratory variability, suggesting right atrial pressure of 3 mmHg. 11. Mild mitral anterior leaflet prolapse. 12. Entire apex is abnormal. 13. Moderate pleural effusion in the left lateral region. FINDINGS  Left Ventricle: Left ventricular ejection fraction, by visual estimation, is 45 to 50%. The left ventricle has mildly decreased function. There is mildly increased left ventricular wall thickness. Normal left atrial pressure.  LV Wall Scoring: The entire apex is akinetic. Right Ventricle: The right ventricular size is normal. No increase in right ventricular wall thickness. Global RV systolic function is has normal systolic function. The tricuspid regurgitant velocity is 3.49 m/s, and with an assumed right atrial pressure  of 3 mmHg, the estimated right ventricular systolic pressure is moderately elevated at 51.7 mmHg. Left Atrium: Left atrial size was normal in size. Right Atrium: Right atrial size was normal in size. Right atrial pressure is estimated at 3 mmHg. Pericardium: There is no evidence of pericardial effusion is seen. There is no evidence of pericardial effusion. There is a moderate pleural effusion in the left lateral  region. Mitral Valve: The mitral valve is normal in structure. There is mild thickening of the mitral valve leaflet(s). Mild mitral annular calcification. No evidence of mitral valve stenosis by observation. Mild mitral valve regurgitation. Mild mitral anterior leaflet prolapse. Tricuspid Valve: The tricuspid valve is normal in structure. Tricuspid valve regurgitation is mild. Aortic Valve: The aortic valve is normal in structure. Aortic valve regurgitation is mild. Mild to moderate aortic valve sclerosis/calcification is  present, without any evidence of aortic stenosis. Pulmonic Valve: The pulmonic valve was normal in structure. Pulmonic valve regurgitation is not visualized by color flow Doppler. Pulmonic regurgitation is not visualized by color flow Doppler. Aorta: The aortic root, ascending aorta and aortic arch are all structurally normal, with no evidence of dilitation or obstruction. Venous: The inferior vena cava is normal in size with greater than 50% respiratory variability, suggesting right atrial pressure of 3 mmHg. Shunts: There is no evidence of a patent foramen ovale. No ventricular septal defect is seen or detected. There is no evidence of an atrial septal defect. No atrial level shunt detected by color flow Doppler.  LEFT VENTRICLE          Normals PLAX 2D LVIDd:         5.20 cm  3.6 cm LVIDs:         2.90 cm  1.7 cm LV PW:         1.40 cm  1.4 cm LV IVS:        1.40 cm  1.3 cm LVOT diam:     2.60 cm  2.0 cm LV SV:         97 ml    79 ml LV SV Index:   48.47    45 ml/m2 LVOT Area:     5.31 cm 3.14 cm2  LEFT ATRIUM         Index LA diam:    5.40 cm 2.73 cm/m   AORTA                 Normals Ao Root diam: 3.50 cm 31 mm Ao Asc diam:  3.40 cm 31 mm TRICUSPID VALVE             Normals TR Peak grad:   48.7 mmHg TR Vmax:        349.00 cm/s 288 cm/s  SHUNTS Systemic Diam: 2.60 cm  Candee Furbish MD Electronically signed by Candee Furbish MD Signature Date/Time: 10/30/2019/4:40:25 PMThe mitral valve is normal in  structure.    Final

## 2019-10-31 NOTE — Progress Notes (Signed)
Pt A&Ox4 OOB with assist to recliner, denies pain or discomfort, productive cough small amount of bloody sputum noted x1, incontinent of B&B condom cath iinplace draining clear yellow urine, large BM noted, O2@2  L via [place], Sats 94 % Remdesivir given via PIV, will cont to monitor.

## 2019-10-31 NOTE — Progress Notes (Addendum)
42 Pt gave permission to talk with grand daughter Ellin Goodie and add her to the [facility]. Reva called and updated on plan of care.

## 2019-11-01 LAB — COMPREHENSIVE METABOLIC PANEL WITH GFR
ALT: 33 U/L (ref 0–44)
AST: 19 U/L (ref 15–41)
Albumin: 2.8 g/dL — ABNORMAL LOW (ref 3.5–5.0)
Alkaline Phosphatase: 72 U/L (ref 38–126)
Anion gap: 10 (ref 5–15)
BUN: 64 mg/dL — ABNORMAL HIGH (ref 8–23)
CO2: 23 mmol/L (ref 22–32)
Calcium: 9.5 mg/dL (ref 8.9–10.3)
Chloride: 102 mmol/L (ref 98–111)
Creatinine, Ser: 2.43 mg/dL — ABNORMAL HIGH (ref 0.61–1.24)
GFR calc Af Amer: 28 mL/min — ABNORMAL LOW
GFR calc non Af Amer: 25 mL/min — ABNORMAL LOW
Glucose, Bld: 215 mg/dL — ABNORMAL HIGH (ref 70–99)
Potassium: 5.5 mmol/L — ABNORMAL HIGH (ref 3.5–5.1)
Sodium: 135 mmol/L (ref 135–145)
Total Bilirubin: 0.6 mg/dL (ref 0.3–1.2)
Total Protein: 6.2 g/dL — ABNORMAL LOW (ref 6.5–8.1)

## 2019-11-01 LAB — CBC
HCT: 33.4 % — ABNORMAL LOW (ref 39.0–52.0)
Hemoglobin: 10.6 g/dL — ABNORMAL LOW (ref 13.0–17.0)
MCH: 29.9 pg (ref 26.0–34.0)
MCHC: 31.7 g/dL (ref 30.0–36.0)
MCV: 94.4 fL (ref 80.0–100.0)
Platelets: 271 10*3/uL (ref 150–400)
RBC: 3.54 MIL/uL — ABNORMAL LOW (ref 4.22–5.81)
RDW: 13.2 % (ref 11.5–15.5)
WBC: 5.8 10*3/uL (ref 4.0–10.5)
nRBC: 0 % (ref 0.0–0.2)

## 2019-11-01 LAB — GLUCOSE, CAPILLARY: Glucose-Capillary: 220 mg/dL — ABNORMAL HIGH (ref 70–99)

## 2019-11-01 LAB — TACROLIMUS LEVEL: Tacrolimus (FK506) - LabCorp: 11.8 ng/mL (ref 2.0–20.0)

## 2019-11-01 LAB — D-DIMER, QUANTITATIVE: D-Dimer, Quant: 1.79 ug{FEU}/mL — ABNORMAL HIGH (ref 0.00–0.50)

## 2019-11-01 LAB — FERRITIN: Ferritin: 579 ng/mL — ABNORMAL HIGH (ref 24–336)

## 2019-11-01 LAB — C-REACTIVE PROTEIN: CRP: 8 mg/dL — ABNORMAL HIGH (ref <1.0)

## 2019-11-01 MED ORDER — INSULIN ASPART 100 UNIT/ML IV SOLN
6.0000 [IU] | Freq: Once | INTRAVENOUS | Status: AC
  Administered 2019-11-01: 6 [IU] via INTRAVENOUS

## 2019-11-01 MED ORDER — FUROSEMIDE 10 MG/ML IJ SOLN
40.0000 mg | Freq: Once | INTRAMUSCULAR | Status: AC
  Administered 2019-11-01: 40 mg via INTRAVENOUS
  Filled 2019-11-01: qty 4

## 2019-11-01 MED ORDER — METHYLPREDNISOLONE SODIUM SUCC 40 MG IJ SOLR
20.0000 mg | Freq: Two times a day (BID) | INTRAMUSCULAR | Status: DC
  Administered 2019-11-01 – 2019-11-02 (×2): 20 mg via INTRAVENOUS
  Filled 2019-11-01 (×2): qty 1

## 2019-11-01 MED ORDER — DEXTROSE 50 % IV SOLN
25.0000 mL | Freq: Once | INTRAVENOUS | Status: AC
  Administered 2019-11-01: 25 mL via INTRAVENOUS
  Filled 2019-11-01: qty 50

## 2019-11-01 MED ORDER — DEXTROSE 50 % IV SOLN
INTRAVENOUS | Status: AC
  Filled 2019-11-01: qty 50

## 2019-11-01 NOTE — Evaluation (Signed)
Occupational Therapy Evaluation Patient Details Name: Alan Hawkins MRN: [id number] DOB: [birth year] Today's Date: 11/01/2019    History of Present Illness 79 yo male presenitng to ED on 1/20 with shortness of breath and found to have acute hypoxic respiratory failure. Tested COVID-19 positive on 1/5. PMH including renal transplant on immunosuppressive's, chronic diastolic heart failure, CAD, CVA, and history of AAA.    Clinical Impression   PTA, pt was living with his wife and was independent. Pt currently requiring Min guard A for ADLs and functional mobility with RW. Pt presenting with decreased activity tolerance as seen by fatigue and decreased SpO2. During mobility in room, pt dropping to 85% on 2L O2. Pt able to take standing rest break and purse lip breathing to elevate SpO2 back to 90s. Pt would benefit from further acute OT to facilitate safe dc. Recommend dc to home once medically stable per physician.      Follow Up Recommendations  No OT follow up;Supervision/Assistance - 24 hour    Equipment Recommendations  None recommended by OT    Recommendations for Other Services PT consult     Precautions / Restrictions Precautions Precautions: Fall Restrictions Weight Bearing Restrictions: No      Mobility Bed Mobility               General bed mobility comments: In recliner upon arrival  Transfers Overall transfer level: Needs assistance Equipment used: Rolling walker (2 wheeled) Transfers: Sit to/from Stand Sit to Stand: Min guard         General transfer comment: MIn GUard A for safety    Balance Overall balance assessment: Mild deficits observed, not formally tested                                         ADL either performed or assessed with clinical judgement   ADL Overall ADL's : Needs assistance/impaired Eating/Feeding: Independent;Sitting   Grooming: Set up;Supervision/safety;Standing   Upper Body Bathing: Set up;Supervision/  safety;Sitting   Lower Body Bathing: Min guard;Sit to/from stand   Upper Body Dressing : Set up;Supervision/safety;Sitting   Lower Body Dressing: Min guard;Sit to/from stand Lower Body Dressing Details (indicate cue type and reason): Pt able to adjust socks while seated at recliner Toilet Transfer: Min guard;Ambulation;RW(simulated to recliner)           Functional mobility during ADLs: Min guard;Rolling walker General ADL Comments: Pt presenting with decreased activity tolerance. Very eager to participate in therapy     Vision         Perception     Praxis      Pertinent Vitals/Pain Pain Assessment: No/denies pain     Hand Dominance Right   Extremity/Trunk Assessment Upper Extremity Assessment Upper Extremity Assessment: Generalized weakness   Lower Extremity Assessment Lower Extremity Assessment: Generalized weakness   Cervical / Trunk Assessment Cervical / Trunk Assessment: Normal   Communication Communication Communication: No difficulties   Cognition Arousal/Alertness: Awake/alert Behavior During Therapy: WFL for tasks assessed/performed Overall Cognitive Status: Within Functional Limits for tasks assessed                                     General Comments  SpO2 dropping to 85% on 2L O2 during functional mobility. Pt able to take standing rest break with purse lip breathing and return  to 90%.    Exercises     Shoulder Instructions      Home Living Family/patient expects to be discharged to:: Private residence Living Arrangements: Spouse/significant other Available Help at Discharge: Family;Available 24 hours/day Type of Home: House Home Access: Stairs to enter [employer] of Steps: 2   Home Layout: One level     Bathroom Shower/Tub: Teacher, early years/pre: Standard     Home Equipment: Shower seat          Prior Functioning/Environment Level of Independence: Independent                  OT Problem List: Decreased strength;Decreased range of motion;Decreased activity tolerance;Impaired balance (sitting and/or standing);Decreased knowledge of use of DME or AE;Decreased knowledge of precautions;Cardiopulmonary status limiting activity      OT Treatment/Interventions: Self-care/ADL training;Therapeutic exercise;Energy conservation;DME and/or AE instruction;Therapeutic activities;Patient/family education    OT Goals(Current goals can be found in the care plan section) Acute Rehab OT Goals Patient Stated Goal: "Get better and go home" OT Goal Formulation: With patient Time For Goal Achievement: 11/15/19 Potential to Achieve Goals: Good  OT Frequency: Min 2X/week   Barriers to D/C:            Co-evaluation              AM-PAC OT "6 Clicks" Daily Activity     Outcome Measure Help from another person eating meals?: None Help from another person taking care of personal grooming?: None Help from another person toileting, which includes using toliet, bedpan, or urinal?: A Little Help from another person bathing (including washing, rinsing, drying)?: A Little Help from another person to put on and taking off regular upper body clothing?: None Help from another person to put on and taking off regular lower body clothing?: A Little 6 Click Score: 21   End of Session Equipment Utilized During Treatment: Oxygen;Rolling walker(2L) Nurse Communication: Mobility status  Activity Tolerance: Patient tolerated treatment well Patient left: in chair;with call bell/phone within reach  OT Visit Diagnosis: Unsteadiness on feet (R26.81);Other abnormalities of gait and mobility (R26.89);Muscle weakness (generalized) (M62.81)                Time: 6659-9357 OT Time Calculation (min): 22 min Charges:  OT General Charges $OT Visit: 1 Visit OT Evaluation $OT Eval Moderate Complexity: [place], OTR/L Acute Rehab Pager: [phone] Office: [place] 11/01/2019, 2:33 PM

## 2019-11-01 NOTE — Progress Notes (Signed)
PROGRESS NOTE                                                                                                                                                                                                             Patient Demographics:    Alan Hawkins, is a 79 y.o. male, DOB - 1940-11-25, ZHY:[id number]  Outpatient Primary MD for the patient is [name], [name], MD   Admit date - 10/30/2019   LOS - 2  Chief Complaint  Patient presents with  . Foot Swelling  . Shortness of Breath       Brief Narrative: Patient is a 79 y.o. 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   Subjective:   He feels better-but he slept in the recliner-and could not lie in bed because of ongoing orthopnea.  His leg edema continues to be very minimal.   Assessment  & Plan :   Acute Hypoxic Resp Failure due to Covid 19 Viral pneumonia: Slowly improving-down to 2 L of oxygen this morning.  CRP downtrending.  Plans are to continue steroids and remdesivir.  Continue attempts to titrate down FiO2.  Fever: afebrile  O2 requirements:  SpO2: 95 % O2 Flow Rate (L/min): 3 L/min   COVID-19 Labs: Recent Labs    10/30/19 1640 10/31/19 0558 10/31/19 0600 10/31/19 0700 11/01/19 0120  DDIMER 1.50*  --   --  1.47* 1.79*  FERRITIN  --  670*  --   --  579*  LDH 186  --   --   --   --   CRP 7.8*  --  10.7*  --  8.0*       Component Value Date/Time   BNP 2,702.2 (H) 10/30/2019 1034    Recent Labs  Lab 10/30/19 1613  PROCALCITON <0.10    Lab Results  Component Value Date   SARSCOV2NAA Detected (A) 10/15/2019     COVID-19 Medications: Steroids: 1/20>> Remdesivir: 1/20>> Bamlanivimab infusion: 1/11  Other  medications: Diuretics:Euvolemic-no need for lasix-as of volume status has improved following Lasix yesterday.  Prone/Incentive Spirometry: encouraged  incentive spirometry use 3-4/hour.  DVT Prophylaxis  :  Heparin  AKI on CKD stage IV-history of renal transplantation: Creatinine appears essentially unchanged compared to yesterday-continue steroids/tacrolimus and mycophenolate.    Hyperkalemia: Probably secondary to AKI-continue Lokelma-getting 1 dose of IV Lasix-repeat electrolytes tomorrow..  Chronic diastolic heart failure: No overt volume overload but has very has very mild lower extremity edema-but does complain of orthopnea today-hence will try 1 dose of IV Lasix today.  History of CAD: No anginal symptoms-continue aspirin.  HTN: Controlled-continue amlodipine, clonidine, labetalol.  Follow and optimize.  BPH: Stable-continue Flomax and finasteride  Consults  :  None  Procedures  :  None  ABG:    Component Value Date/Time   PHART 7.437 07/29/2014 0905   PCO2ART 37.3 07/29/2014 0905   PO2ART 89.0 07/29/2014 0905   HCO3 24.8 (H) 07/29/2014 0905   TCO2 27 08/21/2016 1051   O2SAT 96.7 07/29/2014 0905    Vent Settings: N/A   Condition : Guarded  Family Communication  :  Spouse updated over the phone on 1/22  Code Status :  Full Code  Diet :  Diet Order            Diet renal with fluid restriction Room service appropriate? Yes; Fluid consistency: Thin  Diet effective now               Disposition Plan  :  Remain hospitalized-suspect home with home health in the next few days once hypoxemia is better.  Barriers to discharge: Hypoxia requiring O2 supplementation/complete 5 days of IV Remdesivir  Antimicorbials  :    Anti-infectives (From admission, onward)   Start     Dose/Rate Route Frequency Ordered Stop   10/31/19 1600  remdesivir 100 mg in sodium chloride 0.9 % 100 mL IVPB  Status:  Discontinued     100 mg 200 mL/hr over 30 Minutes Intravenous Daily  10/30/19 1452 10/31/19 1242   10/31/19 1330  remdesivir 100 mg in sodium chloride 0.9 % 100 mL IVPB     100 mg 200 mL/hr over 30 Minutes Intravenous Daily 10/31/19 1242 11/04/19 0959   10/30/19 1600  remdesivir 200 mg in sodium chloride 0.9% 250 mL IVPB     200 mg 580 mL/hr over 30 Minutes Intravenous Once 10/30/19 1452 10/30/19 1654      Inpatient Medications  Scheduled Meds: . amLODipine  10 mg Oral Daily  . aspirin  81 mg Oral Daily  . cloNIDine  0.1 mg Oral TID  . dextromethorphan-guaiFENesin  1 tablet Oral BID  . famotidine  20 mg Oral Daily  . finasteride  5 mg Oral QHS  . heparin injection (subcutaneous)  5,000 Units Subcutaneous Q8H  . labetalol  400 mg Oral BID  . methylPREDNISolone (SOLU-MEDROL) injection  40 mg Intravenous Q12H  . mycophenolate  180 mg Oral BID  . polyethylene glycol  17 g Oral Daily  . sodium chloride flush  3 mL Intravenous Q12H  . sodium zirconium cyclosilicate  10 g Oral Daily  . tacrolimus  3.5 mg Oral BID  . tamsulosin  0.8 mg Oral Daily   Continuous Infusions: . remdesivir 100 mg in NS 100 mL 100 mg (11/01/19 1128)   PRN Meds:.acetaminophen **OR** acetaminophen, fluticasone, polyvinyl alcohol   Time Spent in minutes  25  See all Orders from today for further details   [name] M.D on 11/01/2019 at 1:56 PM  To page go to [email or web address] - use universal password  [facility] -  Office  [phone]    Objective:   Vitals:  11/01/19 0800 11/01/19 0951 11/01/19 1100 11/01/19 1200  BP:    136/77  Pulse:   84   Resp:   16 18  Temp: 97.6 F (36.4 C)   97.7 F (36.5 C)  TempSrc: Oral   Oral  SpO2:  96% 96% 95%  Weight:      Height:        Wt Readings from Last 3 Encounters:  10/30/19 81.6 kg  03/17/17 80.7 kg  08/21/16 80.7 kg     Intake/Output Summary (Last 24 hours) at 11/01/2019 1356 Last data filed at 10/31/2019 1900 Gross per 24 hour  Intake 100 ml  Output 250 ml  Net -150 ml     Physical Exam Gen  Exam:Alert awake-not in any distress HEENT:atraumatic, normocephalic Chest: B/L clear to auscultation anteriorly-very fine bibasilar Rales CVS:S1S2 regular Abdomen:soft non tender, non distended Extremities: Trace edema Neurology: Non focal Skin: no rash   Data Review:    CBC Recent Labs  Lab 10/30/19 0948 10/31/19 0558 11/01/19 0120  WBC 7.9 6.0 5.8  HGB 12.1* 12.2* 10.6*  HCT 37.9* 38.8* 33.4*  PLT 195 312 271  MCV 95.2 95.3 94.4  MCH 30.4 30.0 29.9  MCHC 31.9 31.4 31.7  RDW 13.1 13.1 13.2  LYMPHSABS 0.5*  --   --   MONOABS 1.0  --   --   EOSABS 0.3  --   --   BASOSABS 0.0  --   --     Chemistries  Recent Labs  Lab 10/30/19 1131 10/31/19 0558 10/31/19 0700 10/31/19 1500 11/01/19 0120  NA 137 137  --  135 135  K 5.6* 5.7*  --  5.5* 5.5*  CL 108 102  --  102 102  CO2 19* 20*  --  23 23  GLUCOSE 147* 165*  --  276* 215*  BUN 38* 46*  --  57* 64*  CREATININE 2.52* 2.32*  --  2.42* 2.43*  CALCIUM 9.6 9.6  --  9.4 9.5  MG  --  2.1  --   --   --   AST 31  --  29  --  19  ALT 47*  --  41  --  33  ALKPHOS 83  --  85  --  72  BILITOT 0.9  --  1.3*  --  0.6   ------------------------------------------------------------------------------------------------------------------ No results for input(s): CHOL, HDL, LDLCALC, TRIG, CHOLHDL, LDLDIRECT in the last 72 hours.  Lab Results  Component Value Date   HGBA1C 5.7 (H) 08/22/2016   ------------------------------------------------------------------------------------------------------------------ No results for input(s): TSH, T4TOTAL, T3FREE, THYROIDAB in the last 72 hours.  Invalid input(s): FREET3 ------------------------------------------------------------------------------------------------------------------ Recent Labs    10/31/19 0558 11/01/19 0120  FERRITIN 670* 579*    Coagulation profile No results for input(s): INR, PROTIME in the last 168 hours.  Recent Labs    10/31/19 0700 11/01/19 0120    DDIMER 1.47* 1.79*    Cardiac Enzymes No results for input(s): CKMB, TROPONINI, MYOGLOBIN in the last 168 hours.  Invalid input(s): CK ------------------------------------------------------------------------------------------------------------------    Component Value Date/Time   BNP 2,702.2 (H) 10/30/2019 1034    Micro Results No results found for this or any previous visit (from the past 240 hour(s)).  Radiology Reports DG Chest Port 1 View  Result Date: 10/30/2019 CLINICAL DATA:  Worsening shortness of breath, history of COVID-19 positivity EXAM: PORTABLE CHEST 1 VIEW COMPARISON:  10/19/2019 FINDINGS: Cardiac shadow is stable. Patchy opacities are again identified throughout both lungs but increased particularly in  the right base and right upper lobe with some mild volume loss in the right upper lobe. These changes are consistent with the given clinical history. No bony abnormality is noted. IMPRESSION: Increasing opacities bilaterally particularly on the right consistent with the given clinical history. Electronically Signed   By: Inez Catalina M.D.   On: 10/30/2019 10:55   DG Chest Port 1 View  Result Date: 10/19/2019 CLINICAL DATA:  COVID-19 positive patient.  Worsening cough. EXAM: PORTABLE CHEST 1 VIEW COMPARISON:  August 04, 2014 FINDINGS: No pneumothorax. Suggested subtle peripheral patchy opacities. Cardiomegaly. Superimposed pulmonary venous congestion not excluded. No pneumothorax. IMPRESSION: 1. Subtle peripheral patchy opacities likely from the patient's COVID-19 status. 2. Cardiomegaly. Subtle pulmonary venous congestion is not excluded. Electronically Signed   By: Dorise Bullion III M.D   On: 10/19/2019 14:06   ECHOCARDIOGRAM LIMITED  Result Date: 10/30/2019   ECHOCARDIOGRAM LIMITED REPORT   Patient Name:   Alan Hawkins Date of Exam: 10/30/2019 Medical Rec #:  [id number]     Height:       69.0 in Accession #:    [id number]    Weight:       180.0 lb Date of Birth:   [birth year]      BSA:          1.98 m Patient Age:    68 years      BP:           159/87 mmHg Patient Gender: M             HR:           97 bpm. Exam Location:  Inpatient  Procedure: Limited Echo, Limited Color Doppler and Cardiac Doppler Indications:    congestive heart failure 428.0  History:        Patient has prior history of Echocardiogram examinations, most                 recent 08/22/2016.  Sonographer:    Johny Chess Referring Phys: [id number] ERIK C HOFFMAN IMPRESSIONS  1. Left ventricular ejection fraction, by visual estimation, is 45 to 50%. The left ventricle has mildly decreased function. There is mildly increased left ventricular wall thickness.  2. Global right ventricle has normal systolc function.The right ventricular size is normal. no increase in right ventricular wall thickness.  3. Mild mitral annular calcification.  4. The mitral valve is normal in structure. Mild mitral valve regurgitation. No evidence of mitral stenosis.  5. The tricuspid valve was normal in structure. Tricuspid valve regurgitation is mild.  6. Tricuspid valve regurgitation is mild.  7. Mild to moderate aortic valve sclerosis/calcification without any evidence of aortic stenosis.  8. The pulmonic valve was normal in structure.  9. Moderately elevated pulmonary artery systolic pressure. 10. The inferior vena cava is normal in size with greater than 50% respiratory variability, suggesting right atrial pressure of 3 mmHg. 11. Mild mitral anterior leaflet prolapse. 12. Entire apex is abnormal. 13. Moderate pleural effusion in the left lateral region. FINDINGS  Left Ventricle: Left ventricular ejection fraction, by visual estimation, is 45 to 50%. The left ventricle has mildly decreased function. There is mildly increased left ventricular wall thickness. Normal left atrial pressure.  LV Wall Scoring: The entire apex is akinetic. Right Ventricle: The right ventricular size is normal. No increase in right ventricular wall thickness.  Global RV systolic function is has normal systolic function. The tricuspid regurgitant velocity is 3.49 m/s, and with an assumed right atrial pressure  of 3 mmHg, the estimated right ventricular systolic pressure is moderately elevated at 51.7 mmHg. Left Atrium: Left atrial size was normal in size. Right Atrium: Right atrial size was normal in size. Right atrial pressure is estimated at 3 mmHg. Pericardium: There is no evidence of pericardial effusion is seen. There is no evidence of pericardial effusion. There is a moderate pleural effusion in the left lateral region. Mitral Valve: The mitral valve is normal in structure. There is mild thickening of the mitral valve leaflet(s). Mild mitral annular calcification. No evidence of mitral valve stenosis by observation. Mild mitral valve regurgitation. Mild mitral anterior leaflet prolapse. Tricuspid Valve: The tricuspid valve is normal in structure. Tricuspid valve regurgitation is mild. Aortic Valve: The aortic valve is normal in structure. Aortic valve regurgitation is mild. Mild to moderate aortic valve sclerosis/calcification is present, without any evidence of aortic stenosis. Pulmonic Valve: The pulmonic valve was normal in structure. Pulmonic valve regurgitation is not visualized by color flow Doppler. Pulmonic regurgitation is not visualized by color flow Doppler. Aorta: The aortic root, ascending aorta and aortic arch are all structurally normal, with no evidence of dilitation or obstruction. Venous: The inferior vena cava is normal in size with greater than 50% respiratory variability, suggesting right atrial pressure of 3 mmHg. Shunts: There is no evidence of a patent foramen ovale. No ventricular septal defect is seen or detected. There is no evidence of an atrial septal defect. No atrial level shunt detected by color flow Doppler.  LEFT VENTRICLE          Normals PLAX 2D LVIDd:         5.20 cm  3.6 cm LVIDs:         2.90 cm  1.7 cm LV PW:         1.40 cm   1.4 cm LV IVS:        1.40 cm  1.3 cm LVOT diam:     2.60 cm  2.0 cm LV SV:         97 ml    79 ml LV SV Index:   48.47    45 ml/m2 LVOT Area:     5.31 cm 3.14 cm2  LEFT ATRIUM         Index LA diam:    5.40 cm 2.73 cm/m   AORTA                 Normals Ao Root diam: 3.50 cm 31 mm Ao Asc diam:  3.40 cm 31 mm TRICUSPID VALVE             Normals TR Peak grad:   48.7 mmHg TR Vmax:        349.00 cm/s 288 cm/s  SHUNTS Systemic Diam: 2.60 cm  Candee Furbish MD Electronically signed by Candee Furbish MD Signature Date/Time: 10/30/2019/4:40:25 PMThe mitral valve is normal in structure.    Final

## 2019-11-01 NOTE — Evaluation (Signed)
Physical Therapy Evaluation Patient Details Name: Alan Hawkins MRN: [id number] DOB: 12-Feb-1941 Today's Date: 11/01/2019   History of Present Illness  79 y.o. male with PMHx of renal transplant on immunosuppressive's, chronic diastolic heart failure, CAD, CVA, history of AAA-who was diagnosed with COVID-19 on 1/5-presented to the emergency room on 1/20 with shortness of breath, was found to have acute hypoxic respiratory failure  Clinical Impression   Pt admitted with above dx and hx. He was living at home with family and states was very independent and [place] prior to hospitalization. This pm pt did well with mobility he was able to ambulate approx 15ft with RW and min guard assist around room, on room air desat to min 85% but was able to to recover with standing rest break. On 2L/min sats in low 90s. Pt has been educated on use and importance of use of flutter valve and also incentive spirometer. Pt will greatly benefit from continued PT tx while in hospital and at d/c will need some supervision with mobility.     Follow Up Recommendations No PT follow up;Supervision - Intermittent;Supervision for mobility/OOB    Equipment Recommendations  Rolling walker with 5" wheels    Recommendations for Other Services       Precautions / Restrictions Precautions Precautions: Fall Restrictions Weight Bearing Restrictions: No      Mobility  Bed Mobility               General bed mobility comments: Pt received in recliner  Transfers Overall transfer level: Needs assistance Equipment used: Rolling walker (2 wheeled) Transfers: Sit to/from Stand Sit to Stand: Supervision         General transfer comment: MIn GUard A for safety  Ambulation/Gait Ambulation/Gait assistance: Min guard Gait Distance (Feet): 100 Feet Assistive device: Rolling walker (2 wheeled) Gait Pattern/deviations: Trendelenburg;Narrow base of support(posterior lean) Gait velocity: decreased    General Gait Details: ambulated in room w/ 1 standing rest break, on room air desat to min of 85%  Stairs            Wheelchair Mobility    Modified Rankin (Stroke Patients Only)       Balance Overall balance assessment: Mild deficits observed, not formally tested                                           Pertinent Vitals/Pain Pain Assessment: No/denies pain    Home Living Family/patient expects to be discharged to:: Private residence Living Arrangements: Spouse/significant other Available Help at Discharge: Family;Available 24 hours/day Type of Home: House Home Access: Stairs to enter   [employer] of Steps: 2 Home Layout: One level Home Equipment: Shower seat      Prior Function Level of Independence: Independent               Hand Dominance   Dominant Hand: Right    Extremity/Trunk Assessment   Upper Extremity Assessment Upper Extremity Assessment: Generalized weakness    Lower Extremity Assessment Lower Extremity Assessment: Generalized weakness    Cervical / Trunk Assessment Cervical / Trunk Assessment: Normal  Communication   Communication: No difficulties  Cognition Arousal/Alertness: Awake/alert Behavior During Therapy: WFL for tasks assessed/performed Overall Cognitive Status: No family/caregiver present to determine baseline cognitive functioning  General Comments General comments (skin integrity, edema, etc.): SpO2 dropping to 85% on 2L O2 during functional mobility. Pt able to take standing rest break with purse lip breathing and return to 90%.    Exercises Other Exercises Other Exercises: initiated use of flutter valve x 10 Other Exercises: initiated use of incentive spirometer x 10 pulls 267ml max correctly   Assessment/Plan    PT Assessment Patient needs continued PT services  PT Problem List Decreased strength;Decreased activity  tolerance;Decreased balance;Decreased mobility;Decreased coordination;Decreased knowledge of use of DME;Decreased safety awareness       PT Treatment Interventions Gait training;Stair training;Functional mobility training;Therapeutic activities;Therapeutic exercise;Balance training;Neuromuscular re-education;Patient/family education    PT Goals (Current goals can be found in the Care Plan section)  Acute Rehab PT Goals Patient Stated Goal: to go home PT Goal Formulation: With patient Time For Goal Achievement: 11/15/19 Potential to Achieve Goals: Good    Frequency Min 3X/week   Barriers to discharge        Co-evaluation               AM-PAC PT "6 Clicks" Mobility  Outcome Measure Help needed turning from your back to your side while in a flat bed without using bedrails?: None Help needed moving from lying on your back to sitting on the side of a flat bed without using bedrails?: None Help needed moving to and from a bed to a chair (including a wheelchair)?: A Little Help needed standing up from a chair using your arms (e.g., wheelchair or bedside chair)?: A Little Help needed to walk in hospital room?: A Little Help needed climbing 3-5 steps with a railing? : A Lot 6 Click Score: 19    End of Session Equipment Utilized During Treatment: Oxygen Activity Tolerance: Patient tolerated treatment well;Treatment limited secondary to medical complications (Comment) Patient left: in chair;with call bell/phone within reach Nurse Communication: Mobility status PT Visit Diagnosis: Unsteadiness on feet (R26.81);Muscle weakness (generalized) (M62.81)    Time: 1324-4010 PT Time Calculation (min) (ACUTE ONLY): 17 min   Charges:   PT Evaluation $PT Eval Moderate Complexity: [place], PT   Delford Field 11/01/2019, 4:30 PM

## 2019-11-02 ENCOUNTER — Encounter (HOSPITAL_COMMUNITY): Payer: Self-pay | Admitting: Internal Medicine

## 2019-11-02 LAB — COMPREHENSIVE METABOLIC PANEL
ALT: 41 U/L (ref 0–44)
AST: 33 U/L (ref 15–41)
Albumin: 2.9 g/dL — ABNORMAL LOW (ref 3.5–5.0)
Alkaline Phosphatase: 67 U/L (ref 38–126)
Anion gap: 12 (ref 5–15)
BUN: 83 mg/dL — ABNORMAL HIGH (ref 8–23)
CO2: 23 mmol/L (ref 22–32)
Calcium: 9.6 mg/dL (ref 8.9–10.3)
Chloride: 103 mmol/L (ref 98–111)
Creatinine, Ser: 2.66 mg/dL — ABNORMAL HIGH (ref 0.61–1.24)
GFR calc Af Amer: 25 mL/min — ABNORMAL LOW (ref >60)
GFR calc non Af Amer: 22 mL/min — ABNORMAL LOW (ref >60)
Glucose, Bld: 221 mg/dL — ABNORMAL HIGH (ref 70–99)
Potassium: 5.2 mmol/L — ABNORMAL HIGH (ref 3.5–5.1)
Sodium: 138 mmol/L (ref 135–145)
Total Bilirubin: 0.9 mg/dL (ref 0.3–1.2)
Total Protein: 6.2 g/dL — ABNORMAL LOW (ref 6.5–8.1)

## 2019-11-02 LAB — CBC
HCT: 34.4 % — ABNORMAL LOW (ref 39.0–52.0)
Hemoglobin: 10.9 g/dL — ABNORMAL LOW (ref 13.0–17.0)
MCH: 30 pg (ref 26.0–34.0)
MCHC: 31.7 g/dL (ref 30.0–36.0)
MCV: 94.8 fL (ref 80.0–100.0)
Platelets: 267 10*3/uL (ref 150–400)
RBC: 3.63 MIL/uL — ABNORMAL LOW (ref 4.22–5.81)
RDW: 13.1 % (ref 11.5–15.5)
WBC: 8.4 10*3/uL (ref 4.0–10.5)
nRBC: 0 % (ref 0.0–0.2)

## 2019-11-02 LAB — URINALYSIS, ROUTINE W REFLEX MICROSCOPIC
Bilirubin Urine: NEGATIVE
Glucose, UA: 50 mg/dL — AB
Hgb urine dipstick: NEGATIVE
Ketones, ur: NEGATIVE mg/dL
Leukocytes,Ua: NEGATIVE
Nitrite: NEGATIVE
Protein, ur: NEGATIVE mg/dL
Specific Gravity, Urine: 1.02 (ref 1.005–1.030)
pH: 5 (ref 5.0–8.0)

## 2019-11-02 LAB — D-DIMER, QUANTITATIVE: D-Dimer, Quant: 2.36 ug/mL-FEU — ABNORMAL HIGH (ref 0.00–0.50)

## 2019-11-02 LAB — FERRITIN: Ferritin: 593 ng/mL — ABNORMAL HIGH (ref 24–336)

## 2019-11-02 LAB — C-REACTIVE PROTEIN: CRP: 5.2 mg/dL — ABNORMAL HIGH (ref <1.0)

## 2019-11-02 MED ORDER — TACROLIMUS 1 MG PO CAPS
3.0000 mg | ORAL_CAPSULE | Freq: Two times a day (BID) | ORAL | Status: DC
  Administered 2019-11-02 – 2019-11-05 (×6): 3 mg via ORAL
  Filled 2019-11-02 (×7): qty 3

## 2019-11-02 MED ORDER — PREDNISONE 20 MG PO TABS
30.0000 mg | ORAL_TABLET | Freq: Every day | ORAL | Status: DC
  Administered 2019-11-02: 30 mg via ORAL

## 2019-11-02 NOTE — Progress Notes (Signed)
Pt wife called at this time and updated on plan of care. Questions answered. She appreciated the update!

## 2019-11-02 NOTE — Progress Notes (Signed)
PROGRESS NOTE                                                                                                                                                                                                             Patient Demographics:    Alan Hawkins, is a 79 y.o. male, DOB - Jun 18, 1941, CWU:[id number]  Outpatient Primary MD for the patient is [name], [name], MD   Admit date - 10/30/2019   LOS - 3  Chief Complaint  Patient presents with  . Foot Swelling  . Shortness of Breath       Brief Narrative: Patient is a 79 y.o. 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   Subjective:   Doing much better-he was titrated down to room air this morning.  He slept better-he is now able to lie almost flat which he could not do till yesterday.   Assessment  & Plan :   Acute Hypoxic Resp Failure due to Covid 19 Viral pneumonia: Improved-titrated down to room air.  CRP continues to downtrend-steroids changed to prednisone today.  Continue supportive care.  Fever: afebrile  O2 requirements:  SpO2: 96 % O2 Flow Rate (L/min): 2 L/min   COVID-19 Labs: Recent Labs    10/30/19 1640 10/30/19 1640 10/31/19 0558 10/31/19 0600 10/31/19 0700 11/01/19 0120 11/02/19 0111  DDIMER 1.50*   < >  --   --  1.47* 1.79* 2.36*  FERRITIN  --   --  670*  --   --  579* 593*  LDH 186  --   --   --   --   --   --   CRP 7.8*   < >  --  10.7*  --  8.0* 5.2*   < > = values in this interval not displayed.       Component Value Date/Time   BNP 2,702.2 (H) 10/30/2019 1034    Recent Labs  Lab 10/30/19 1613  PROCALCITON <0.10    Lab Results  Component Value Date   SARSCOV2NAA Detected (A) 10/15/2019      COVID-19 Medications: Steroids: 1/20>> Remdesivir: 1/20>> Bamlanivimab infusion: 1/11  Other medications: Diuretics:Euvolemic  Prone/Incentive Spirometry: encouraged  incentive spirometry use 3-4/hour.  DVT Prophylaxis  :  Heparin   AKI on CKD stage IV-history of renal transplantation: Creatinine continues to slowly uptrend-have spoken with nephrology Dr. Renato Shin UA, check ultrasound of transplanted kidney.  Tacrolimus level slightly elevated-nephrology recommends to decrease tacrolimus to 3 mg twice daily-recheck tacrolimus levels in 24 hours.  Continue to avoid  nephrotoxic agents  Hyperkalemia: Improved-continue Lokelma.  Chronic diastolic heart failure: Euvolemic today-no edema-orthopnea better.  No need for Lasix.  History of CAD: No anginal symptoms-continue aspirin.  HTN: Controlled-continue amlodipine, clonidine, labetalol.  Follow and optimize.  BPH: Stable-continue Flomax and finasteride  Consults  :  None  Procedures  :  None  ABG:    Component Value Date/Time   PHART 7.437 07/29/2014 0905   PCO2ART 37.3 07/29/2014 0905   PO2ART 89.0 07/29/2014 0905   HCO3 24.8 (H) 07/29/2014 0905   TCO2 27 08/21/2016 1051   O2SAT 96.7 07/29/2014 0905    Vent Settings: N/A   Condition : Guarded  Family Communication  :  Spouse and granddaughter updated over the phone on 1/23  Code Status :  Full Code  Diet :  Diet Order            Diet renal with fluid restriction Room service appropriate? Yes; Fluid consistency: Thin  Diet effective now               Disposition Plan  :  Remain hospitalized-suspect home with home health in the next few days once hypoxemia is better.  Barriers to discharge: Hypoxia requiring O2 supplementation/complete 5 days of IV Remdesivir  Antimicorbials  :    Anti-infectives (From admission, onward)   Start     Dose/Rate Route Frequency Ordered Stop   10/31/19 1600  remdesivir 100 mg in sodium chloride 0.9 % 100 mL IVPB   Status:  Discontinued     100 mg 200 mL/hr over 30 Minutes Intravenous Daily 10/30/19 1452 10/31/19 1242   10/31/19 1330  remdesivir 100 mg in sodium chloride 0.9 % 100 mL IVPB     100 mg 200 mL/hr over 30 Minutes Intravenous Daily 10/31/19 1242 11/04/19 0959   10/30/19 1600  remdesivir 200 mg in sodium chloride 0.9% 250 mL IVPB     200 mg 580 mL/hr over 30 Minutes Intravenous Once 10/30/19 1452 10/30/19 1654      Inpatient Medications  Scheduled Meds: . amLODipine  10 mg Oral Daily  . aspirin  81 mg Oral Daily  . cloNIDine  0.1 mg Oral TID  . dextromethorphan-guaiFENesin  1 tablet Oral BID  . famotidine  20 mg Oral Daily  . finasteride  5 mg Oral QHS  . heparin injection (subcutaneous)  5,000 Units Subcutaneous Q8H  . labetalol  400 mg Oral BID  . mycophenolate  180 mg Oral BID  . polyethylene glycol  17 g Oral Daily  . predniSONE  30 mg Oral Q breakfast  . sodium chloride flush  3 mL Intravenous Q12H  . sodium zirconium cyclosilicate  10 g Oral Daily  . tacrolimus  3.5 mg Oral BID  . tamsulosin  0.8 mg Oral Daily   Continuous Infusions: . remdesivir 100 mg in NS 100 mL 100 mg (11/02/19 0921)   PRN Meds:.acetaminophen **OR** acetaminophen, fluticasone, polyvinyl alcohol   Time Spent in minutes  25  See all Orders from today for further details   [name] M.D on 11/02/2019 at 10:39 AM  To page go to [email or web address] -  use universal password  [facility] -  Office  [phone]    Objective:   Vitals:   11/02/19 0000 11/02/19 0400 11/02/19 0700 11/02/19 0800  BP: 134/70 (!) 137/98    Pulse: 79 84 84 93  Resp: 17 (!) 21 15 (!) 22  Temp: 98 F (36.7 C) 98.2 F (36.8 C)  (!) 97.4 F (36.3 C)  TempSrc: Oral Oral  Oral  SpO2: 98% 92% 93% 96%  Weight:      Height:        Wt Readings from Last 3 Encounters:  10/30/19 81.6 kg  03/17/17 80.7 kg  08/21/16 80.7 kg     Intake/Output Summary (Last 24 hours) at 11/02/2019 1039 Last data filed at  11/01/2019 1600 Gross per 24 hour  Intake 600 ml  Output 900 ml  Net -300 ml     Physical Exam Gen Exam:Alert awake-not in any distress HEENT:atraumatic, normocephalic Chest: B/L clear to auscultation anteriorly CVS:S1S2 regular Abdomen:soft non tender, non distended Extremities:no edema Neurology: Non focal Skin: no rash   Data Review:    CBC Recent Labs  Lab 10/30/19 0948 10/31/19 0558 11/01/19 0120 11/02/19 0111  WBC 7.9 6.0 5.8 8.4  HGB 12.1* 12.2* 10.6* 10.9*  HCT 37.9* 38.8* 33.4* 34.4*  PLT 195 312 271 267  MCV 95.2 95.3 94.4 94.8  MCH 30.4 30.0 29.9 30.0  MCHC 31.9 31.4 31.7 31.7  RDW 13.1 13.1 13.2 13.1  LYMPHSABS 0.5*  --   --   --   MONOABS 1.0  --   --   --   EOSABS 0.3  --   --   --   BASOSABS 0.0  --   --   --     Chemistries  Recent Labs  Lab 10/30/19 1131 10/31/19 0558 10/31/19 0700 10/31/19 1500 11/01/19 0120 11/02/19 0111  NA 137 137  --  135 135 138  K 5.6* 5.7*  --  5.5* 5.5* 5.2*  CL 108 102  --  102 102 103  CO2 19* 20*  --  23 23 23   GLUCOSE 147* 165*  --  276* 215* 221*  BUN 38* 46*  --  57* 64* 83*  CREATININE 2.52* 2.32*  --  2.42* 2.43* 2.66*  CALCIUM 9.6 9.6  --  9.4 9.5 9.6  MG  --  2.1  --   --   --   --   AST 31  --  29  --  19 33  ALT 47*  --  41  --  33 41  ALKPHOS 83  --  85  --  72 67  BILITOT 0.9  --  1.3*  --  0.6 0.9   ------------------------------------------------------------------------------------------------------------------ No results for input(s): CHOL, HDL, LDLCALC, TRIG, CHOLHDL, LDLDIRECT in the last 72 hours.  Lab Results  Component Value Date   HGBA1C 5.7 (H) 08/22/2016   ------------------------------------------------------------------------------------------------------------------ No results for input(s): TSH, T4TOTAL, T3FREE, THYROIDAB in the last 72 hours.  Invalid input(s):  FREET3 ------------------------------------------------------------------------------------------------------------------ Recent Labs    11/01/19 0120 11/02/19 0111  FERRITIN 579* 593*    Coagulation profile No results for input(s): INR, PROTIME in the last 168 hours.  Recent Labs    11/01/19 0120 11/02/19 0111  DDIMER 1.79* 2.36*    Cardiac Enzymes No results for input(s): CKMB, TROPONINI, MYOGLOBIN in the last 168 hours.  Invalid input(s): CK ------------------------------------------------------------------------------------------------------------------    Component Value Date/Time   BNP 2,702.2 (H) 10/30/2019 1034    Micro Results No  results found for this or any previous visit (from the past 240 hour(s)).  Radiology Reports DG Chest Port 1 View  Result Date: 10/30/2019 CLINICAL DATA:  Worsening shortness of breath, history of COVID-19 positivity EXAM: PORTABLE CHEST 1 VIEW COMPARISON:  10/19/2019 FINDINGS: Cardiac shadow is stable. Patchy opacities are again identified throughout both lungs but increased particularly in the right base and right upper lobe with some mild volume loss in the right upper lobe. These changes are consistent with the given clinical history. No bony abnormality is noted. IMPRESSION: Increasing opacities bilaterally particularly on the right consistent with the given clinical history. Electronically Signed   By: Inez Catalina M.D.   On: 10/30/2019 10:55   DG Chest Port 1 View  Result Date: 10/19/2019 CLINICAL DATA:  COVID-19 positive patient.  Worsening cough. EXAM: PORTABLE CHEST 1 VIEW COMPARISON:  August 04, 2014 FINDINGS: No pneumothorax. Suggested subtle peripheral patchy opacities. Cardiomegaly. Superimposed pulmonary venous congestion not excluded. No pneumothorax. IMPRESSION: 1. Subtle peripheral patchy opacities likely from the patient's COVID-19 status. 2. Cardiomegaly. Subtle pulmonary venous congestion is not excluded. Electronically  Signed   By: Dorise Bullion III M.D   On: 10/19/2019 14:06   ECHOCARDIOGRAM LIMITED  Result Date: 10/30/2019   ECHOCARDIOGRAM LIMITED REPORT   Patient Name:   Alan Hawkins Date of Exam: 10/30/2019 Medical Rec #:  [id number]     Height:       69.0 in Accession #:    [id number]    Weight:       180.0 lb Date of Birth:  [birth year]      BSA:          1.98 m Patient Age:    58 years      BP:           159/87 mmHg Patient Gender: M             HR:           97 bpm. Exam Location:  Inpatient  Procedure: Limited Echo, Limited Color Doppler and Cardiac Doppler Indications:    congestive heart failure 428.0  History:        Patient has prior history of Echocardiogram examinations, most                 recent 08/22/2016.  Sonographer:    Johny Chess Referring Phys: [id number] ERIK C HOFFMAN IMPRESSIONS  1. Left ventricular ejection fraction, by visual estimation, is 45 to 50%. The left ventricle has mildly decreased function. There is mildly increased left ventricular wall thickness.  2. Global right ventricle has normal systolc function.The right ventricular size is normal. no increase in right ventricular wall thickness.  3. Mild mitral annular calcification.  4. The mitral valve is normal in structure. Mild mitral valve regurgitation. No evidence of mitral stenosis.  5. The tricuspid valve was normal in structure. Tricuspid valve regurgitation is mild.  6. Tricuspid valve regurgitation is mild.  7. Mild to moderate aortic valve sclerosis/calcification without any evidence of aortic stenosis.  8. The pulmonic valve was normal in structure.  9. Moderately elevated pulmonary artery systolic pressure. 10. The inferior vena cava is normal in size with greater than 50% respiratory variability, suggesting right atrial pressure of 3 mmHg. 11. Mild mitral anterior leaflet prolapse. 12. Entire apex is abnormal. 13. Moderate pleural effusion in the left lateral region. FINDINGS  Left Ventricle: Left ventricular ejection fraction,  by visual estimation, is 45 to 50%. The left ventricle has mildly  decreased function. There is mildly increased left ventricular wall thickness. Normal left atrial pressure.  LV Wall Scoring: The entire apex is akinetic. Right Ventricle: The right ventricular size is normal. No increase in right ventricular wall thickness. Global RV systolic function is has normal systolic function. The tricuspid regurgitant velocity is 3.49 m/s, and with an assumed right atrial pressure  of 3 mmHg, the estimated right ventricular systolic pressure is moderately elevated at 51.7 mmHg. Left Atrium: Left atrial size was normal in size. Right Atrium: Right atrial size was normal in size. Right atrial pressure is estimated at 3 mmHg. Pericardium: There is no evidence of pericardial effusion is seen. There is no evidence of pericardial effusion. There is a moderate pleural effusion in the left lateral region. Mitral Valve: The mitral valve is normal in structure. There is mild thickening of the mitral valve leaflet(s). Mild mitral annular calcification. No evidence of mitral valve stenosis by observation. Mild mitral valve regurgitation. Mild mitral anterior leaflet prolapse. Tricuspid Valve: The tricuspid valve is normal in structure. Tricuspid valve regurgitation is mild. Aortic Valve: The aortic valve is normal in structure. Aortic valve regurgitation is mild. Mild to moderate aortic valve sclerosis/calcification is present, without any evidence of aortic stenosis. Pulmonic Valve: The pulmonic valve was normal in structure. Pulmonic valve regurgitation is not visualized by color flow Doppler. Pulmonic regurgitation is not visualized by color flow Doppler. Aorta: The aortic root, ascending aorta and aortic arch are all structurally normal, with no evidence of dilitation or obstruction. Venous: The inferior vena cava is normal in size with greater than 50% respiratory variability, suggesting right atrial pressure of 3 mmHg. Shunts:  There is no evidence of a patent foramen ovale. No ventricular septal defect is seen or detected. There is no evidence of an atrial septal defect. No atrial level shunt detected by color flow Doppler.  LEFT VENTRICLE          Normals PLAX 2D LVIDd:         5.20 cm  3.6 cm LVIDs:         2.90 cm  1.7 cm LV PW:         1.40 cm  1.4 cm LV IVS:        1.40 cm  1.3 cm LVOT diam:     2.60 cm  2.0 cm LV SV:         97 ml    79 ml LV SV Index:   48.47    45 ml/m2 LVOT Area:     5.31 cm 3.14 cm2  LEFT ATRIUM         Index LA diam:    5.40 cm 2.73 cm/m   AORTA                 Normals Ao Root diam: 3.50 cm 31 mm Ao Asc diam:  3.40 cm 31 mm TRICUSPID VALVE             Normals TR Peak grad:   48.7 mmHg TR Vmax:        349.00 cm/s 288 cm/s  SHUNTS Systemic Diam: 2.60 cm  Candee Furbish MD Electronically signed by Candee Furbish MD Signature Date/Time: 10/30/2019/4:40:25 PMThe mitral valve is normal in structure.    Final

## 2019-11-03 ENCOUNTER — Inpatient Hospital Stay (HOSPITAL_COMMUNITY): Payer: Medicare Other

## 2019-11-03 LAB — COMPREHENSIVE METABOLIC PANEL
ALT: 61 U/L — ABNORMAL HIGH (ref 0–44)
AST: 37 U/L (ref 15–41)
Albumin: 2.9 g/dL — ABNORMAL LOW (ref 3.5–5.0)
Alkaline Phosphatase: 66 U/L (ref 38–126)
Anion gap: 12 (ref 5–15)
BUN: 86 mg/dL — ABNORMAL HIGH (ref 8–23)
CO2: 24 mmol/L (ref 22–32)
Calcium: 9.1 mg/dL (ref 8.9–10.3)
Chloride: 100 mmol/L (ref 98–111)
Creatinine, Ser: 2.37 mg/dL — ABNORMAL HIGH (ref 0.61–1.24)
GFR calc Af Amer: 29 mL/min — ABNORMAL LOW (ref >60)
GFR calc non Af Amer: 25 mL/min — ABNORMAL LOW (ref >60)
Glucose, Bld: 122 mg/dL — ABNORMAL HIGH (ref 70–99)
Potassium: 4.3 mmol/L (ref 3.5–5.1)
Sodium: 136 mmol/L (ref 135–145)
Total Bilirubin: 1.1 mg/dL (ref 0.3–1.2)
Total Protein: 5.9 g/dL — ABNORMAL LOW (ref 6.5–8.1)

## 2019-11-03 LAB — D-DIMER, QUANTITATIVE: D-Dimer, Quant: 2.23 ug/mL-FEU — ABNORMAL HIGH (ref 0.00–0.50)

## 2019-11-03 LAB — FERRITIN: Ferritin: 554 ng/mL — ABNORMAL HIGH (ref 24–336)

## 2019-11-03 LAB — CBC
HCT: 35.6 % — ABNORMAL LOW (ref 39.0–52.0)
Hemoglobin: 11.6 g/dL — ABNORMAL LOW (ref 13.0–17.0)
MCH: 30.8 pg (ref 26.0–34.0)
MCHC: 32.6 g/dL (ref 30.0–36.0)
MCV: 94.4 fL (ref 80.0–100.0)
Platelets: 265 10*3/uL (ref 150–400)
RBC: 3.77 MIL/uL — ABNORMAL LOW (ref 4.22–5.81)
RDW: 13.4 % (ref 11.5–15.5)
WBC: 9.2 10*3/uL (ref 4.0–10.5)
nRBC: 0 % (ref 0.0–0.2)

## 2019-11-03 LAB — PROCALCITONIN: Procalcitonin: 0.12 ng/mL

## 2019-11-03 LAB — C-REACTIVE PROTEIN: CRP: 3.5 mg/dL — ABNORMAL HIGH (ref <1.0)

## 2019-11-03 MED ORDER — ALBUTEROL SULFATE (2.5 MG/3ML) 0.083% IN NEBU
2.5000 mg | INHALATION_SOLUTION | RESPIRATORY_TRACT | Status: DC | PRN
  Filled 2019-11-03: qty 3

## 2019-11-03 MED ORDER — GUAIFENESIN ER 600 MG PO TB12
1200.0000 mg | ORAL_TABLET | Freq: Two times a day (BID) | ORAL | Status: DC
  Administered 2019-11-03 – 2019-11-05 (×4): 1200 mg via ORAL
  Filled 2019-11-03 (×4): qty 2

## 2019-11-03 MED ORDER — HYDRALAZINE HCL 20 MG/ML IJ SOLN: 10.0000 mg | Freq: Four times a day (QID) | INTRAMUSCULAR | Status: DC | PRN

## 2019-11-03 MED ORDER — ALBUTEROL SULFATE HFA 108 (90 BASE) MCG/ACT IN AERS
1.0000 | INHALATION_SPRAY | RESPIRATORY_TRACT | Status: DC | PRN
  Administered 2019-11-03: 13:00:00 1 via RESPIRATORY_TRACT
  Filled 2019-11-03: qty 6.7

## 2019-11-03 MED ORDER — HYDROCOD POLST-CPM POLST ER 10-8 MG/5ML PO SUER
5.0000 mL | Freq: Two times a day (BID) | ORAL | Status: DC | PRN
  Administered 2019-11-03 – 2019-11-05 (×4): 5 mL via ORAL
  Filled 2019-11-03 (×4): qty 5

## 2019-11-03 MED ORDER — ALBUTEROL SULFATE HFA 108 (90 BASE) MCG/ACT IN AERS
1.0000 | INHALATION_SPRAY | Freq: Four times a day (QID) | RESPIRATORY_TRACT | Status: DC
  Administered 2019-11-03 – 2019-11-05 (×7): 1 via RESPIRATORY_TRACT

## 2019-11-03 MED ORDER — PREDNISONE 20 MG PO TABS
20.0000 mg | ORAL_TABLET | Freq: Every day | ORAL | Status: DC
  Administered 2019-11-03 – 2019-11-05 (×3): 20 mg via ORAL
  Filled 2019-11-03 (×3): qty 1

## 2019-11-03 NOTE — Progress Notes (Signed)
PROGRESS NOTE                                                                                                                                                                                                             Patient Demographics:    Alan Hawkins, is a 79 y.o. male, DOB - 05-Apr-1941, WSF:[id number]  Outpatient Primary MD for the patient is [name], [name], MD   Admit date - 10/30/2019   LOS - 4  Chief Complaint  Patient presents with  . Foot Swelling  . Shortness of Breath       Brief Narrative: Patient is a 79 y.o. 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   Subjective:   Remains on room air-developed some chest congestion-and wheezing this morning.  But otherwise continues to look much improved.  Denies any nausea vomiting.   Assessment  & Plan :   Acute Hypoxic Resp Failure due to Covid 19 Viral pneumonia: Improved-on room air-CRP continues to trend down.  Continue tapering prednisone.  Completed remdesivir on 1/24.  Has developed some chest congestion today-chest x-ray looks essentially the same-recheck procalcitonin-if elevated-we will start antibiotics.  Fever: afebrile  O2 requirements:  SpO2: 98 % O2 Flow Rate (L/min): 2 L/min   COVID-19 Labs: Recent Labs    11/01/19 0120 11/02/19 0111 11/03/19 0301  DDIMER 1.79* 2.36* 2.23*  FERRITIN 579* 593* 554*  CRP 8.0* 5.2* 3.5*       Component Value Date/Time   BNP 2,702.2 (H) 10/30/2019 1034    Recent Labs  Lab 10/30/19 1613  PROCALCITON <0.10    Lab Results  Component Value Date   SARSCOV2NAA Detected (A) 10/15/2019     COVID-19 Medications: Steroids: 1/20>> Remdesivir: 1/20>> Bamlanivimab infusion:  1/11  Other medications: Diuretics:Euvolemic  Prone/Incentive Spirometry: encouraged  incentive spirometry use 3-4/hour.  DVT Prophylaxis  :  Heparin   AKI on CKD stage IV-history of renal transplantation: Finally creatinine downtrending-renal transplant ultrasound without any hydronephrosis.  UA without proteinuria.  Tacrolimus dosage decreased to 3 mg twice daily-we will check levels with morning labs.  Continue supportive care.  Avoid nephrotoxic agents.    Hyperkalemia:  Improved-continue Lokelma for a few more days..  Chronic diastolic heart failure: Euvolemic today-no edema-orthopnea better.  No need for Lasix.  History of CAD: No anginal symptoms-continue aspirin.  HTN: Controlled-continue amlodipine, clonidine, labetalol.  Follow and optimize.  BPH: Stable-continue Flomax and finasteride  Consults  :  None  Procedures  :  None  ABG:    Component Value Date/Time   PHART 7.437 07/29/2014 0905   PCO2ART 37.3 07/29/2014 0905   PO2ART 89.0 07/29/2014 0905   HCO3 24.8 (H) 07/29/2014 0905   TCO2 27 08/21/2016 1051   O2SAT 96.7 07/29/2014 0905    Vent Settings: N/A   Condition : Guarded  Family Communication  : Spoke with patient's granddaughter on 1/24 who will update patient's spouse.  Code Status :  Full Code  Diet :  Diet Order            Diet renal with fluid restriction Room service appropriate? Yes; Fluid consistency: Thin  Diet effective now               Disposition Plan  :  Remain hospitalized-suspect home in the next day or 2.  Barriers to discharge: Complete 5 days of IV Remdesivir  Antimicorbials  :    Anti-infectives (From admission, onward)   Start     Dose/Rate Route Frequency Ordered Stop   10/31/19 1600  remdesivir 100 mg in sodium chloride 0.9 % 100 mL IVPB  Status:  Discontinued     100 mg 200 mL/hr over 30 Minutes Intravenous Daily 10/30/19 1452 10/31/19 1242   10/31/19 1330  remdesivir 100 mg in sodium chloride 0.9 % 100 mL IVPB       100 mg 200 mL/hr over 30 Minutes Intravenous Daily 10/31/19 1242 11/03/19 1031   10/30/19 1600  remdesivir 200 mg in sodium chloride 0.9% 250 mL IVPB     200 mg 580 mL/hr over 30 Minutes Intravenous Once 10/30/19 1452 10/30/19 1654      Inpatient Medications  Scheduled Meds: . amLODipine  10 mg Oral Daily  . aspirin  81 mg Oral Daily  . cloNIDine  0.1 mg Oral TID  . dextromethorphan-guaiFENesin  1 tablet Oral BID  . famotidine  20 mg Oral Daily  . finasteride  5 mg Oral QHS  . heparin injection (subcutaneous)  5,000 Units Subcutaneous Q8H  . labetalol  400 mg Oral BID  . mycophenolate  180 mg Oral BID  . polyethylene glycol  17 g Oral Daily  . predniSONE  20 mg Oral Q breakfast  . sodium chloride flush  3 mL Intravenous Q12H  . sodium zirconium cyclosilicate  10 g Oral Daily  . tacrolimus  3 mg Oral BID  . tamsulosin  0.8 mg Oral Daily   Continuous Infusions:  PRN Meds:.acetaminophen **OR** acetaminophen, albuterol, chlorpheniramine-HYDROcodone, fluticasone, polyvinyl alcohol   Time Spent in minutes  25  See all Orders from today for further details   [name] M.D on 11/03/2019 at 12:07 PM  To page go to [email or web address] - use universal password  [facility] -  Office  [phone]    Objective:   Vitals:   11/03/19 0000 11/03/19 0016 11/03/19 0423 11/03/19 0800  BP: 139/76  134/76 (!) 149/85  Pulse: 80 81 78 92  Resp: (!) 21 19 (!) 23 16  Temp: 98.3 F (36.8 C)   (!) 95.6 F (35.3 C)  TempSrc: Oral   Axillary  SpO2: 100% 100% 100% 98%  Weight:  Height:        Wt Readings from Last 3 Encounters:  10/30/19 81.6 kg  03/17/17 80.7 kg  08/21/16 80.7 kg     Intake/Output Summary (Last 24 hours) at 11/03/2019 1207 Last data filed at 11/03/2019 1001 Gross per 24 hour  Intake 500 ml  Output 1100 ml  Net -600 ml     Physical Exam Gen Exam:Alert awake-not in any distress HEENT:atraumatic, normocephalic Chest: B/L clear to auscultation  anteriorly CVS:S1S2 regular Abdomen:soft non tender, non distended Extremities:no edema Neurology: Non focal Skin: no rash   Data Review:    CBC Recent Labs  Lab 10/30/19 0948 10/31/19 0558 11/01/19 0120 11/02/19 0111 11/03/19 0301  WBC 7.9 6.0 5.8 8.4 9.2  HGB 12.1* 12.2* 10.6* 10.9* 11.6*  HCT 37.9* 38.8* 33.4* 34.4* 35.6*  PLT 195 312 271 267 265  MCV 95.2 95.3 94.4 94.8 94.4  MCH 30.4 30.0 29.9 30.0 30.8  MCHC 31.9 31.4 31.7 31.7 32.6  RDW 13.1 13.1 13.2 13.1 13.4  LYMPHSABS 0.5*  --   --   --   --   MONOABS 1.0  --   --   --   --   EOSABS 0.3  --   --   --   --   BASOSABS 0.0  --   --   --   --     Chemistries  Recent Labs  Lab 10/30/19 1131 10/30/19 1131 10/31/19 0558 10/31/19 0700 10/31/19 1500 11/01/19 0120 11/02/19 0111 11/03/19 0301  NA 137   < > 137  --  135 135 138 136  K 5.6*   < > 5.7*  --  5.5* 5.5* 5.2* 4.3  CL 108   < > 102  --  102 102 103 100  CO2 19*   < > 20*  --  23 23 23 24   GLUCOSE 147*   < > 165*  --  276* 215* 221* 122*  BUN 38*   < > 46*  --  57* 64* 83* 86*  CREATININE 2.52*   < > 2.32*  --  2.42* 2.43* 2.66* 2.37*  CALCIUM 9.6   < > 9.6  --  9.4 9.5 9.6 9.1  MG  --   --  2.1  --   --   --   --   --   AST 31  --   --  29  --  19 33 37  ALT 47*  --   --  41  --  33 41 61*  ALKPHOS 83  --   --  85  --  72 67 66  BILITOT 0.9  --   --  1.3*  --  0.6 0.9 1.1   < > = values in this interval not displayed.   ------------------------------------------------------------------------------------------------------------------ No results for input(s): CHOL, HDL, LDLCALC, TRIG, CHOLHDL, LDLDIRECT in the last 72 hours.  Lab Results  Component Value Date   HGBA1C 5.7 (H) 08/22/2016   ------------------------------------------------------------------------------------------------------------------ No results for input(s): TSH, T4TOTAL, T3FREE, THYROIDAB in the last 72 hours.  Invalid input(s):  FREET3 ------------------------------------------------------------------------------------------------------------------ Recent Labs    11/02/19 0111 11/03/19 0301  FERRITIN 593* 554*    Coagulation profile No results for input(s): INR, PROTIME in the last 168 hours.  Recent Labs    11/02/19 0111 11/03/19 0301  DDIMER 2.36* 2.23*    Cardiac Enzymes No results for input(s): CKMB, TROPONINI, MYOGLOBIN in the last 168 hours.  Invalid input(s): CK ------------------------------------------------------------------------------------------------------------------    Component  Value Date/Time   BNP 2,702.2 (H) 10/30/2019 1034    Micro Results No results found for this or any previous visit (from the past 240 hour(s)).  Radiology Reports US Renal Transplant w/Doppler  Result Date: 11/03/2019 CLINICAL DATA:  COVID positive, chronic heart failure, acute hypoxic respiratory failure, acute kidney injury, renal transplant EXAM: ULTRASOUND OF RENAL TRANSPLANT WITH RENAL DOPPLER ULTRASOUND TECHNIQUE: Ultrasound examination of the renal transplant was performed with gray-scale, color and duplex doppler evaluation. COMPARISON:  None. FINDINGS: Transplant kidney location: RLQ Transplant Kidney: Renal measurements: 10.5 x 5.4 x 4.8 cm = volume: 141 mL. Normal in size and parenchymal echogenicity. No evidence of mass or hydronephrosis. No peri-transplant fluid collection seen. Color flow in the main renal artery:  Yes Color flow in the main renal vein:  Yes Duplex Doppler Evaluation: Main Renal Artery Resistive Index: 0.6 Venous waveform in main renal vein:  Present Intrarenal resistive index in upper pole:  0.54 (normal 0.6-0.8; equivocal 0.8-0.9; abnormal >= 0.9) Intrarenal resistive index in lower pole: 0.7 (normal 0.6-0.8; equivocal 0.8-0.9; abnormal >= 0.9) Bladder: Normal for degree of bladder distention. Other findings:  None. IMPRESSION: Right lower quadrant renal transplant without  hydronephrosis. Normal resistive indices. Electronically Signed   By: Julian Hy M.D.   On: 11/03/2019 08:58   DG CHEST PORT 1 VIEW  Result Date: 11/03/2019 CLINICAL DATA:  COVID positive.  Shortness of breath. EXAM: PORTABLE CHEST 1 VIEW COMPARISON:  10/30/2019 FINDINGS: Patchy bilateral airspace disease is again noted, progressive in the left upper lobe and relatively similar in the right upper lobe and both lung bases. The cardio pericardial silhouette is enlarged. No substantial pleural effusion although there is probably a tiny right sided effusion. The visualized bony structures of the thorax are intact. Telemetry leads overlie the chest. IMPRESSION: Some progression of patchy airspace disease in the left base with relatively similar patchy airspace opacity elsewhere in the bilateral lungs. Electronically Signed   By: Misty Stanley M.D.   On: 11/03/2019 10:53   DG Chest Port 1 View  Result Date: 10/30/2019 CLINICAL DATA:  Worsening shortness of breath, history of COVID-19 positivity EXAM: PORTABLE CHEST 1 VIEW COMPARISON:  10/19/2019 FINDINGS: Cardiac shadow is stable. Patchy opacities are again identified throughout both lungs but increased particularly in the right base and right upper lobe with some mild volume loss in the right upper lobe. These changes are consistent with the given clinical history. No bony abnormality is noted. IMPRESSION: Increasing opacities bilaterally particularly on the right consistent with the given clinical history. Electronically Signed   By: Inez Catalina M.D.   On: 10/30/2019 10:55   DG Chest Port 1 View  Result Date: 10/19/2019 CLINICAL DATA:  COVID-19 positive patient.  Worsening cough. EXAM: PORTABLE CHEST 1 VIEW COMPARISON:  August 04, 2014 FINDINGS: No pneumothorax. Suggested subtle peripheral patchy opacities. Cardiomegaly. Superimposed pulmonary venous congestion not excluded. No pneumothorax. IMPRESSION: 1. Subtle peripheral patchy opacities likely  from the patient's COVID-19 status. 2. Cardiomegaly. Subtle pulmonary venous congestion is not excluded. Electronically Signed   By: Dorise Bullion III M.D   On: 10/19/2019 14:06   ECHOCARDIOGRAM LIMITED  Result Date: 10/30/2019   ECHOCARDIOGRAM LIMITED REPORT   Patient Name:   BRANDT CHANEY Date of Exam: 10/30/2019 Medical Rec #:  [id number]     Height:       69.0 in Accession #:    [id number]    Weight:       180.0 lb Date of Birth:  Aug 24, 1941      BSA:          1.98 m Patient Age:    23 years      BP:           159/87 mmHg Patient Gender: M             HR:           97 bpm. Exam Location:  Inpatient  Procedure: Limited Echo, Limited Color Doppler and Cardiac Doppler Indications:    congestive heart failure 428.0  History:        Patient has prior history of Echocardiogram examinations, most                 recent 08/22/2016.  Sonographer:    Johny Chess Referring Phys: [id number] ERIK C HOFFMAN IMPRESSIONS  1. Left ventricular ejection fraction, by visual estimation, is 45 to 50%. The left ventricle has mildly decreased function. There is mildly increased left ventricular wall thickness.  2. Global right ventricle has normal systolc function.The right ventricular size is normal. no increase in right ventricular wall thickness.  3. Mild mitral annular calcification.  4. The mitral valve is normal in structure. Mild mitral valve regurgitation. No evidence of mitral stenosis.  5. The tricuspid valve was normal in structure. Tricuspid valve regurgitation is mild.  6. Tricuspid valve regurgitation is mild.  7. Mild to moderate aortic valve sclerosis/calcification without any evidence of aortic stenosis.  8. The pulmonic valve was normal in structure.  9. Moderately elevated pulmonary artery systolic pressure. 10. The inferior vena cava is normal in size with greater than 50% respiratory variability, suggesting right atrial pressure of 3 mmHg. 11. Mild mitral anterior leaflet prolapse. 12. Entire apex is abnormal.  13. Moderate pleural effusion in the left lateral region. FINDINGS  Left Ventricle: Left ventricular ejection fraction, by visual estimation, is 45 to 50%. The left ventricle has mildly decreased function. There is mildly increased left ventricular wall thickness. Normal left atrial pressure.  LV Wall Scoring: The entire apex is akinetic. Right Ventricle: The right ventricular size is normal. No increase in right ventricular wall thickness. Global RV systolic function is has normal systolic function. The tricuspid regurgitant velocity is 3.49 m/s, and with an assumed right atrial pressure  of 3 mmHg, the estimated right ventricular systolic pressure is moderately elevated at 51.7 mmHg. Left Atrium: Left atrial size was normal in size. Right Atrium: Right atrial size was normal in size. Right atrial pressure is estimated at 3 mmHg. Pericardium: There is no evidence of pericardial effusion is seen. There is no evidence of pericardial effusion. There is a moderate pleural effusion in the left lateral region. Mitral Valve: The mitral valve is normal in structure. There is mild thickening of the mitral valve leaflet(s). Mild mitral annular calcification. No evidence of mitral valve stenosis by observation. Mild mitral valve regurgitation. Mild mitral anterior leaflet prolapse. Tricuspid Valve: The tricuspid valve is normal in structure. Tricuspid valve regurgitation is mild. Aortic Valve: The aortic valve is normal in structure. Aortic valve regurgitation is mild. Mild to moderate aortic valve sclerosis/calcification is present, without any evidence of aortic stenosis. Pulmonic Valve: The pulmonic valve was normal in structure. Pulmonic valve regurgitation is not visualized by color flow Doppler. Pulmonic regurgitation is not visualized by color flow Doppler. Aorta: The aortic root, ascending aorta and aortic arch are all structurally normal, with no evidence of dilitation or obstruction. Venous: The inferior vena cava  is normal in size  with greater than 50% respiratory variability, suggesting right atrial pressure of 3 mmHg. Shunts: There is no evidence of a patent foramen ovale. No ventricular septal defect is seen or detected. There is no evidence of an atrial septal defect. No atrial level shunt detected by color flow Doppler.  LEFT VENTRICLE          Normals PLAX 2D LVIDd:         5.20 cm  3.6 cm LVIDs:         2.90 cm  1.7 cm LV PW:         1.40 cm  1.4 cm LV IVS:        1.40 cm  1.3 cm LVOT diam:     2.60 cm  2.0 cm LV SV:         97 ml    79 ml LV SV Index:   48.47    45 ml/m2 LVOT Area:     5.31 cm 3.14 cm2  LEFT ATRIUM         Index LA diam:    5.40 cm 2.73 cm/m   AORTA                 Normals Ao Root diam: 3.50 cm 31 mm Ao Asc diam:  3.40 cm 31 mm TRICUSPID VALVE             Normals TR Peak grad:   48.7 mmHg TR Vmax:        349.00 cm/s 288 cm/s  SHUNTS Systemic Diam: 2.60 cm  Candee Furbish MD Electronically signed by Candee Furbish MD Signature Date/Time: 10/30/2019/4:40:25 PMThe mitral valve is normal in structure.    Final

## 2019-11-03 NOTE — Progress Notes (Signed)
MD updated on pt pt status per telemetry / Orders received for EKG . Orders carried out .vitals stable . Safety measures in place. Continue to monitor pt .

## 2019-11-03 NOTE — Progress Notes (Signed)
Pt in encourage to [place] IS at bedside . Pt was able to return demo . Will f/u with pts plan of care during the shift and continue to to monitor .

## 2019-11-04 ENCOUNTER — Inpatient Hospital Stay (HOSPITAL_COMMUNITY): Payer: Medicare Other

## 2019-11-04 LAB — BRAIN NATRIURETIC PEPTIDE: B Natriuretic Peptide: 3369.5 pg/mL — ABNORMAL HIGH (ref 0.0–100.0)

## 2019-11-04 LAB — COMPREHENSIVE METABOLIC PANEL
ALT: 55 U/L — ABNORMAL HIGH (ref 0–44)
AST: 27 U/L (ref 15–41)
Albumin: 2.9 g/dL — ABNORMAL LOW (ref 3.5–5.0)
Alkaline Phosphatase: 78 U/L (ref 38–126)
Anion gap: 12 (ref 5–15)
BUN: 83 mg/dL — ABNORMAL HIGH (ref 8–23)
CO2: 23 mmol/L (ref 22–32)
Calcium: 9.4 mg/dL (ref 8.9–10.3)
Chloride: 105 mmol/L (ref 98–111)
Creatinine, Ser: 2.14 mg/dL — ABNORMAL HIGH (ref 0.61–1.24)
GFR calc Af Amer: 33 mL/min — ABNORMAL LOW (ref >60)
GFR calc non Af Amer: 29 mL/min — ABNORMAL LOW (ref >60)
Glucose, Bld: 158 mg/dL — ABNORMAL HIGH (ref 70–99)
Potassium: 4.5 mmol/L (ref 3.5–5.1)
Sodium: 140 mmol/L (ref 135–145)
Total Bilirubin: 0.9 mg/dL (ref 0.3–1.2)
Total Protein: 6 g/dL — ABNORMAL LOW (ref 6.5–8.1)

## 2019-11-04 LAB — CBC
HCT: 34.6 % — ABNORMAL LOW (ref 39.0–52.0)
Hemoglobin: 11 g/dL — ABNORMAL LOW (ref 13.0–17.0)
MCH: 29.9 pg (ref 26.0–34.0)
MCHC: 31.8 g/dL (ref 30.0–36.0)
MCV: 94 fL (ref 80.0–100.0)
Platelets: 267 10*3/uL (ref 150–400)
RBC: 3.68 MIL/uL — ABNORMAL LOW (ref 4.22–5.81)
RDW: 13.4 % (ref 11.5–15.5)
WBC: 6.6 10*3/uL (ref 4.0–10.5)
nRBC: 0 % (ref 0.0–0.2)

## 2019-11-04 LAB — PROCALCITONIN: Procalcitonin: 0.14 ng/mL

## 2019-11-04 LAB — C-REACTIVE PROTEIN: CRP: 8.3 mg/dL — ABNORMAL HIGH (ref <1.0)

## 2019-11-04 LAB — FERRITIN: Ferritin: 654 ng/mL — ABNORMAL HIGH (ref 24–336)

## 2019-11-04 LAB — D-DIMER, QUANTITATIVE: D-Dimer, Quant: 1.63 ug/mL-FEU — ABNORMAL HIGH (ref 0.00–0.50)

## 2019-11-04 NOTE — Progress Notes (Signed)
PROGRESS NOTE                                                                                                                                                                                                             Patient Demographics:    Alan Hawkins, is a 79 y.o. male, DOB - 25-Aug-1941, WEX:[id number]  Outpatient Primary MD for the patient is [name], [name], MD   Admit date - 10/30/2019   LOS - 5  Chief Complaint  Patient presents with  . Foot Swelling  . Shortness of Breath       Brief Narrative: Patient is a 79 y.o. 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   Subjective:   No major events overnight-still has some chest congestion-but thinks he is overall better.   Assessment  & Plan :   Acute Hypoxic Resp Failure due to Covid 19 Viral pneumonia: Remains on room air-and significantly improved compared to how he first presented to the hospital.  However chest congestion that started yesterday still continues-chest x-ray really does not look that different from his past few x-rays-procalcitonin is negative.  However CRP is uptrending today.  No signs of volume overload on my exam.  Plans are to continue supportive care-encourage ambulation-encourage pulmonary toileting with Mucinex/albuterol and use of incentive spirometry/flutter valve and reassess tomorrow.  Continue tapering steroids.    Fever: afebrile  O2 requirements:  SpO2: 100 % O2 Flow Rate (L/min): 2 L/min   COVID-19 Labs: Recent Labs    11/02/19 0111 11/03/19 0301 11/04/19 0239  DDIMER 2.36* 2.23* 1.63*  FERRITIN 593* 554* 654*  CRP 5.2* 3.5* 8.3*       Component Value Date/Time   BNP 3,369.5 (H) 11/04/2019 0239      Recent Labs  Lab 10/30/19 1613 11/03/19 0301 11/04/19 0239  PROCALCITON <0.10 0.12 0.14    Lab Results  Component Value Date   SARSCOV2NAA Detected (A) 10/15/2019     COVID-19 Medications: Steroids: 1/20>> Remdesivir: 1/20>> 1/24 Bamlanivimab infusion: 1/11  Other medications: Diuretics:Euvolemic  Prone/Incentive Spirometry: encouraged  incentive spirometry use 3-4/hour.  DVT Prophylaxis  :  Heparin   AKI on  CKD stage IV-history of renal transplantation: Creatinine slowly improving-likely hemodynamically mediated kidney injury.  Renal transplant ultrasound without any hydronephrosis.  UA without proteinuria.  Tacrolimus dosage decreased to 3 mg twice daily-tacrolimus levels (sent out today).  Continue supportive care.  Avoid nephrotoxic agents.    Hyperkalemia: Improved-continue Lokelma for a few more days..  Chronic diastolic heart failure: Euvolemic today-no edema-orthopnea better.  No need for Lasix.  History of CAD: No anginal symptoms-continue aspirin.  HTN: Controlled-continue amlodipine, clonidine, labetalol.  Follow and optimize.  BPH: Stable-continue Flomax and finasteride  Consults  :  None  Procedures  :  None  ABG:    Component Value Date/Time   PHART 7.437 07/29/2014 0905   PCO2ART 37.3 07/29/2014 0905   PO2ART 89.0 07/29/2014 0905   HCO3 24.8 (H) 07/29/2014 0905   TCO2 27 08/21/2016 1051   O2SAT 96.7 07/29/2014 0905    Vent Settings: N/A   Condition : Guarded  Family Communication  : Spoke with patient's granddaughter on 1/25 who will update patient's spouse.  Code Status :  Full Code  Diet :  Diet Order            Diet renal with fluid restriction Room service appropriate? Yes; Fluid consistency: Thin  Diet effective now               Disposition Plan  :  Remain hospitalized-suspect home in the next day or 2.  Barriers to discharge: Complete 5 days of IV Remdesivir  Antimicorbials  :    Anti-infectives (From admission,  onward)   Start     Dose/Rate Route Frequency Ordered Stop   10/31/19 1600  remdesivir 100 mg in sodium chloride 0.9 % 100 mL IVPB  Status:  Discontinued     100 mg 200 mL/hr over 30 Minutes Intravenous Daily 10/30/19 1452 10/31/19 1242   10/31/19 1330  remdesivir 100 mg in sodium chloride 0.9 % 100 mL IVPB     100 mg 200 mL/hr over 30 Minutes Intravenous Daily 10/31/19 1242 11/03/19 1031   10/30/19 1600  remdesivir 200 mg in sodium chloride 0.9% 250 mL IVPB     200 mg 580 mL/hr over 30 Minutes Intravenous Once 10/30/19 1452 10/30/19 1654      Inpatient Medications  Scheduled Meds: . albuterol  1 puff Inhalation Q6H  . amLODipine  10 mg Oral Daily  . aspirin  81 mg Oral Daily  . cloNIDine  0.1 mg Oral TID  . famotidine  20 mg Oral Daily  . finasteride  5 mg Oral QHS  . guaiFENesin  1,200 mg Oral BID  . heparin injection (subcutaneous)  5,000 Units Subcutaneous Q8H  . labetalol  400 mg Oral BID  . mycophenolate  180 mg Oral BID  . polyethylene glycol  17 g Oral Daily  . predniSONE  20 mg Oral Q breakfast  . sodium chloride flush  3 mL Intravenous Q12H  . sodium zirconium cyclosilicate  10 g Oral Daily  . tacrolimus  3 mg Oral BID  . tamsulosin  0.8 mg Oral Daily   Continuous Infusions:  PRN Meds:.acetaminophen **OR** acetaminophen, chlorpheniramine-HYDROcodone, fluticasone, hydrALAZINE, polyvinyl alcohol   Time Spent in minutes  25  See all Orders from today for further details   [name] M.D on 11/04/2019 at 1:21 PM  To page go to [email or web address] - use universal password  [facility] -  Office  [phone]    Objective:   Vitals:   11/04/19 0800 11/04/19 0900 11/04/19  1000 11/04/19 1200  BP: (!) 142/77   140/79  Pulse: 81 85 82 87  Resp: 20 (!) 21 18 18   Temp:      TempSrc:      SpO2: 99% 97% 99% 100%  Weight:      Height:        Wt Readings from Last 3 Encounters:  10/30/19 81.6 kg  03/17/17 80.7 kg  08/21/16 80.7 kg      Intake/Output Summary (Last 24 hours) at 11/04/2019 1321 Last data filed at 11/04/2019 1200 Gross per 24 hour  Intake --  Output 1550 ml  Net -1550 ml     Physical Exam Gen Exam:Alert awake-not in any distress HEENT:atraumatic, normocephalic Chest: B/L clear to auscultation anteriorly CVS:S1S2 regular Abdomen:soft non tender, non distended Extremities:no edema Neurology: Non focal Skin: no rash   Data Review:    CBC Recent Labs  Lab 10/30/19 0948 10/30/19 0948 10/31/19 0558 11/01/19 0120 11/02/19 0111 11/03/19 0301 11/04/19 0239  WBC 7.9   < > 6.0 5.8 8.4 9.2 6.6  HGB 12.1*   < > 12.2* 10.6* 10.9* 11.6* 11.0*  HCT 37.9*   < > 38.8* 33.4* 34.4* 35.6* 34.6*  PLT 195   < > 312 271 267 265 267  MCV 95.2   < > 95.3 94.4 94.8 94.4 94.0  MCH 30.4   < > 30.0 29.9 30.0 30.8 29.9  MCHC 31.9   < > 31.4 31.7 31.7 32.6 31.8  RDW 13.1   < > 13.1 13.2 13.1 13.4 13.4  LYMPHSABS 0.5*  --   --   --   --   --   --   MONOABS 1.0  --   --   --   --   --   --   EOSABS 0.3  --   --   --   --   --   --   BASOSABS 0.0  --   --   --   --   --   --    < > = values in this interval not displayed.    Chemistries  Recent Labs  Lab 10/30/19 1131 10/31/19 0558 10/31/19 0558 10/31/19 0700 10/31/19 1500 11/01/19 0120 11/02/19 0111 11/03/19 0301 11/04/19 0239  NA  --  137   < >  --  135 135 138 136 140  K  --  5.7*   < >  --  5.5* 5.5* 5.2* 4.3 4.5  CL  --  102   < >  --  102 102 103 100 105  CO2  --  20*   < >  --  23 23 23 24 23   GLUCOSE  --  165*   < >  --  276* 215* 221* 122* 158*  BUN  --  46*   < >  --  57* 64* 83* 86* 83*  CREATININE  --  2.32*   < >  --  2.42* 2.43* 2.66* 2.37* 2.14*  CALCIUM  --  9.6   < >  --  9.4 9.5 9.6 9.1 9.4  MG  --  2.1  --   --   --   --   --   --   --   AST   < >  --   --  29  --  19 33 37 27  ALT   < >  --   --  41  --  33 41 61* 55*  ALKPHOS   < >  --   --  85  --  72 67 66 78  BILITOT   < >  --   --  1.3*  --  0.6 0.9 1.1 0.9   < > =  values in this interval not displayed.   ------------------------------------------------------------------------------------------------------------------ No results for input(s): CHOL, HDL, LDLCALC, TRIG, CHOLHDL, LDLDIRECT in the last 72 hours.  Lab Results  Component Value Date   HGBA1C 5.7 (H) 08/22/2016   ------------------------------------------------------------------------------------------------------------------ No results for input(s): TSH, T4TOTAL, T3FREE, THYROIDAB in the last 72 hours.  Invalid input(s): FREET3 ------------------------------------------------------------------------------------------------------------------ Recent Labs    11/03/19 0301 11/04/19 0239  FERRITIN 554* 654*    Coagulation profile No results for input(s): INR, PROTIME in the last 168 hours.  Recent Labs    11/03/19 0301 11/04/19 0239  DDIMER 2.23* 1.63*    Cardiac Enzymes No results for input(s): CKMB, TROPONINI, MYOGLOBIN in the last 168 hours.  Invalid input(s): CK ------------------------------------------------------------------------------------------------------------------    Component Value Date/Time   BNP 3,369.5 (H) 11/04/2019 0239    Micro Results No results found for this or any previous visit (from the past 240 hour(s)).  Radiology Reports US Renal Transplant w/Doppler  Result Date: 11/03/2019 CLINICAL DATA:  COVID positive, chronic heart failure, acute hypoxic respiratory failure, acute kidney injury, renal transplant EXAM: ULTRASOUND OF RENAL TRANSPLANT WITH RENAL DOPPLER ULTRASOUND TECHNIQUE: Ultrasound examination of the renal transplant was performed with gray-scale, color and duplex doppler evaluation. COMPARISON:  None. FINDINGS: Transplant kidney location: RLQ Transplant Kidney: Renal measurements: 10.5 x 5.4 x 4.8 cm = volume: 141 mL. Normal in size and parenchymal echogenicity. No evidence of mass or hydronephrosis. No peri-transplant fluid collection  seen. Color flow in the main renal artery:  Yes Color flow in the main renal vein:  Yes Duplex Doppler Evaluation: Main Renal Artery Resistive Index: 0.6 Venous waveform in main renal vein:  Present Intrarenal resistive index in upper pole:  0.54 (normal 0.6-0.8; equivocal 0.8-0.9; abnormal >= 0.9) Intrarenal resistive index in lower pole: 0.7 (normal 0.6-0.8; equivocal 0.8-0.9; abnormal >= 0.9) Bladder: Normal for degree of bladder distention. Other findings:  None. IMPRESSION: Right lower quadrant renal transplant without hydronephrosis. Normal resistive indices. Electronically Signed   By: Julian Hy M.D.   On: 11/03/2019 08:58   DG Chest Port 1 View  Result Date: 11/04/2019 CLINICAL DATA:  COVID-19 positive, shortness of breath. EXAM: PORTABLE CHEST 1 VIEW COMPARISON:  11/03/2019. FINDINGS: Trachea is midline. Heart is enlarged, stable. Thoracic aorta is calcified. Patchy bilateral airspace opacification, similar to yesterday's exam. There may be a small right pleural effusion. IMPRESSION: 1. Multilobar COVID-19 pneumonia, similar to 11/03/2019. 2. Small right pleural effusion. 3.  Aortic atherosclerosis (ICD10-I70.0). Electronically Signed   By: Lorin Picket M.D.   On: 11/04/2019 07:47   DG CHEST PORT 1 VIEW  Result Date: 11/03/2019 CLINICAL DATA:  COVID positive.  Shortness of breath. EXAM: PORTABLE CHEST 1 VIEW COMPARISON:  10/30/2019 FINDINGS: Patchy bilateral airspace disease is again noted, progressive in the left upper lobe and relatively similar in the right upper lobe and both lung bases. The cardio pericardial silhouette is enlarged. No substantial pleural effusion although there is probably a tiny right sided effusion. The visualized bony structures of the thorax are intact. Telemetry leads overlie the chest. IMPRESSION: Some progression of patchy airspace disease in the left base with relatively similar patchy airspace opacity elsewhere in the bilateral lungs. Electronically Signed    By: Misty Stanley M.D.   On: 11/03/2019 10:53   DG Chest [facility]  Result Date: 10/30/2019 CLINICAL DATA:  Worsening shortness of breath, history of COVID-19 positivity EXAM: PORTABLE CHEST 1 VIEW COMPARISON:  10/19/2019 FINDINGS: Cardiac shadow is stable. Patchy opacities are again identified throughout both lungs but increased particularly in the right base and right upper lobe with some mild volume loss in the right upper lobe. These changes are consistent with the given clinical history. No bony abnormality is noted. IMPRESSION: Increasing opacities bilaterally particularly on the right consistent with the given clinical history. Electronically Signed   By: Inez Catalina M.D.   On: 10/30/2019 10:55   DG Chest Port 1 View  Result Date: 10/19/2019 CLINICAL DATA:  COVID-19 positive patient.  Worsening cough. EXAM: PORTABLE CHEST 1 VIEW COMPARISON:  August 04, 2014 FINDINGS: No pneumothorax. Suggested subtle peripheral patchy opacities. Cardiomegaly. Superimposed pulmonary venous congestion not excluded. No pneumothorax. IMPRESSION: 1. Subtle peripheral patchy opacities likely from the patient's COVID-19 status. 2. Cardiomegaly. Subtle pulmonary venous congestion is not excluded. Electronically Signed   By: Dorise Bullion III M.D   On: 10/19/2019 14:06   ECHOCARDIOGRAM LIMITED  Result Date: 10/30/2019   ECHOCARDIOGRAM LIMITED REPORT   Patient Name:   GARCIA DALZELL Date of Exam: 10/30/2019 Medical Rec #:  [id number]     Height:       69.0 in Accession #:    [id number]    Weight:       180.0 lb Date of Birth:  05/11/1941      BSA:          1.98 m Patient Age:    73 years      BP:           159/87 mmHg Patient Gender: M             HR:           97 bpm. Exam Location:  Inpatient  Procedure: Limited Echo, Limited Color Doppler and Cardiac Doppler Indications:    congestive heart failure 428.0  History:        Patient has prior history of Echocardiogram examinations, most                 recent 08/22/2016.   Sonographer:    Johny Chess Referring Phys: [id number] ERIK C HOFFMAN IMPRESSIONS  1. Left ventricular ejection fraction, by visual estimation, is 45 to 50%. The left ventricle has mildly decreased function. There is mildly increased left ventricular wall thickness.  2. Global right ventricle has normal systolc function.The right ventricular size is normal. no increase in right ventricular wall thickness.  3. Mild mitral annular calcification.  4. The mitral valve is normal in structure. Mild mitral valve regurgitation. No evidence of mitral stenosis.  5. The tricuspid valve was normal in structure. Tricuspid valve regurgitation is mild.  6. Tricuspid valve regurgitation is mild.  7. Mild to moderate aortic valve sclerosis/calcification without any evidence of aortic stenosis.  8. The pulmonic valve was normal in structure.  9. Moderately elevated pulmonary artery systolic pressure. 10. The inferior vena cava is normal in size with greater than 50% respiratory variability, suggesting right atrial pressure of 3 mmHg. 11. Mild mitral anterior leaflet prolapse. 12. Entire apex is abnormal. 13. Moderate pleural effusion in the left lateral region. FINDINGS  Left Ventricle: Left ventricular ejection fraction, by visual estimation, is 45 to 50%. The left ventricle has mildly decreased function. There is mildly increased left ventricular wall thickness. Normal left atrial pressure.  LV Wall Scoring: The entire apex is akinetic.  Right Ventricle: The right ventricular size is normal. No increase in right ventricular wall thickness. Global RV systolic function is has normal systolic function. The tricuspid regurgitant velocity is 3.49 m/s, and with an assumed right atrial pressure  of 3 mmHg, the estimated right ventricular systolic pressure is moderately elevated at 51.7 mmHg. Left Atrium: Left atrial size was normal in size. Right Atrium: Right atrial size was normal in size. Right atrial pressure is estimated at 3 mmHg.  Pericardium: There is no evidence of pericardial effusion is seen. There is no evidence of pericardial effusion. There is a moderate pleural effusion in the left lateral region. Mitral Valve: The mitral valve is normal in structure. There is mild thickening of the mitral valve leaflet(s). Mild mitral annular calcification. No evidence of mitral valve stenosis by observation. Mild mitral valve regurgitation. Mild mitral anterior leaflet prolapse. Tricuspid Valve: The tricuspid valve is normal in structure. Tricuspid valve regurgitation is mild. Aortic Valve: The aortic valve is normal in structure. Aortic valve regurgitation is mild. Mild to moderate aortic valve sclerosis/calcification is present, without any evidence of aortic stenosis. Pulmonic Valve: The pulmonic valve was normal in structure. Pulmonic valve regurgitation is not visualized by color flow Doppler. Pulmonic regurgitation is not visualized by color flow Doppler. Aorta: The aortic root, ascending aorta and aortic arch are all structurally normal, with no evidence of dilitation or obstruction. Venous: The inferior vena cava is normal in size with greater than 50% respiratory variability, suggesting right atrial pressure of 3 mmHg. Shunts: There is no evidence of a patent foramen ovale. No ventricular septal defect is seen or detected. There is no evidence of an atrial septal defect. No atrial level shunt detected by color flow Doppler.  LEFT VENTRICLE          Normals PLAX 2D LVIDd:         5.20 cm  3.6 cm LVIDs:         2.90 cm  1.7 cm LV PW:         1.40 cm  1.4 cm LV IVS:        1.40 cm  1.3 cm LVOT diam:     2.60 cm  2.0 cm LV SV:         97 ml    79 ml LV SV Index:   48.47    45 ml/m2 LVOT Area:     5.31 cm 3.14 cm2  LEFT ATRIUM         Index LA diam:    5.40 cm 2.73 cm/m   AORTA                 Normals Ao Root diam: 3.50 cm 31 mm Ao Asc diam:  3.40 cm 31 mm TRICUSPID VALVE             Normals TR Peak grad:   48.7 mmHg TR Vmax:        349.00  cm/s 288 cm/s  SHUNTS Systemic Diam: 2.60 cm  Candee Furbish MD Electronically signed by Candee Furbish MD Signature Date/Time: 10/30/2019/4:40:25 PMThe mitral valve is normal in structure.    Final

## 2019-11-04 NOTE — Progress Notes (Addendum)
Occupational Therapy Treatment Patient Details Name: Alan Hawkins MRN: [id number] DOB: [birth year] Today's Date: 11/04/2019    History of present illness 79 y.o. male with PMHx of renal transplant on immunosuppressive's, chronic diastolic heart failure, CAD, CVA, history of AAA-who was diagnosed with COVID-19 on 1/5-presented to the emergency room on 1/20 with shortness of breath, was found to have acute hypoxic respiratory failure   OT comments  Pt progressing towards established OT goals and continues to present with high motivation to return to PLOF. Pt performing functional mobility in room with Supervision-Min Guard and RW. Performing oral care at sink with Supervision. SpO2 90s on RA. Continue to recommend dc to home once medically stable per physician. Will continue to follow acutely.   Follow Up Recommendations  No OT follow up;Supervision/Assistance - 24 hour    Equipment Recommendations  None recommended by OT    Recommendations for Other Services PT consult    Precautions / Restrictions Precautions Precautions: Fall Restrictions Weight Bearing Restrictions: No       Mobility Bed Mobility               General bed mobility comments: Pt received in recliner  Transfers Overall transfer level: Needs assistance Equipment used: Rolling walker (2 wheeled) Transfers: Sit to/from Stand Sit to Stand: Supervision         General transfer comment: Supervision for safety    Balance Overall balance assessment: Mild deficits observed, not formally tested                                         ADL either performed or assessed with clinical judgement   ADL Overall ADL's : Needs assistance/impaired     Grooming: Set up;Supervision/safety;Standing;Oral care Grooming Details (indicate cue type and reason): Supervision for safety                 Toilet Transfer: Ambulation;RW;Supervision/safety(simulated to recliner) Toilet Transfer Details  (indicate cue type and reason): Cues for hand placement at RW. Min guard A for Nurse, learning disability Details (indicate cue type and reason): Discussed use of shower chair and taking cooler showers for breathing Functional mobility during ADLs: Min guard;Rolling walker;Supervision/safety General ADL Comments: Pt continues to present with decreasd activity tolerance but very motivated.     Vision       Perception     Praxis      Cognition Arousal/Alertness: Awake/alert Behavior During Therapy: WFL for tasks assessed/performed Overall Cognitive Status: Within Functional Limits for tasks assessed                                 General Comments: Following simple commands and maintaining conversation        Exercises     Shoulder Instructions       General Comments SpO2 94-87% on RA with mobility. Placed on new O2 monitor and pt maintained SpO2 in 90s throughout.    Pertinent Vitals/ Pain       Pain Assessment: No/denies pain  Home Living Family/patient expects to be discharged to:: Private residence Living Arrangements: Spouse/significant other Available Help at Discharge: Family;Available 24 hours/day Type of Home: House Home Access: Stairs to enter [employer] of Steps: 2   Home Layout: One level     Bathroom Shower/Tub: Tub/shower unit  Bathroom Toilet: Standard     Home Equipment: Shower seat          Prior Functioning/Environment Level of Independence: Independent            Frequency  Min 2X/week        Progress Toward Goals  OT Goals(current goals can now be found in the care plan section)  Progress towards OT goals: Progressing toward goals  Acute Rehab OT Goals Patient Stated Goal: to go home OT Goal Formulation: With patient Time For Goal Achievement: 11/15/19 Potential to Achieve Goals: Good ADL Goals Pt Will Perform Grooming: with modified independence;standing Pt Will Perform Lower Body  Dressing: with modified independence;sit to/from stand Pt Will Transfer to Toilet: with modified independence;ambulating;regular height toilet Pt Will Perform Toileting - Clothing Manipulation and hygiene: with modified independence;sitting/lateral leans;sit to/from stand Additional ADL Goal #1: Pt will independently verablize three energy conservation techniques for ADLs and IADLs Additional ADL Goal #2: Pt will independently monitor SpO2 and utilize purse lip breathing during ADLs  Plan Discharge plan remains appropriate    Co-evaluation    PT/OT/SLP Co-Evaluation/Treatment: Yes Reason for Co-Treatment: Other (comment)(activity tolerance)   OT goals addressed during session: ADL's and self-care      AM-PAC OT "6 Clicks" Daily Activity     Outcome Measure   Help from another person eating meals?: None Help from another person taking care of personal grooming?: None Help from another person toileting, which includes using toliet, bedpan, or urinal?: A Little Help from another person bathing (including washing, rinsing, drying)?: A Little Help from another person to put on and taking off regular upper body clothing?: None Help from another person to put on and taking off regular lower body clothing?: A Little 6 Click Score: 21    End of Session Equipment Utilized During Treatment: Rolling walker  OT Visit Diagnosis: Unsteadiness on feet (R26.81);Other abnormalities of gait and mobility (R26.89);Muscle weakness (generalized) (M62.81)   Activity Tolerance Patient tolerated treatment well   Patient Left in chair;with call bell/phone within reach   Nurse Communication Mobility status        Time: 4034-7425 OT Time Calculation (min): 23 min  Charges: OT General Charges $OT Visit: 1 Visit OT Treatments $Self Care/Home Management : 8-22 mins  [place], OTR/L Acute Rehab Pager: [phone] Office: [place] 11/04/2019, 4:25 PM

## 2019-11-04 NOTE — Plan of Care (Signed)
  Problem: Education: Goal: Knowledge of risk factors and measures for prevention of condition will improve Outcome: Progressing   Problem: Coping: Goal: Psychosocial and spiritual needs will be supported Outcome: Progressing   Problem: Respiratory: Goal: Will maintain a patent airway Outcome: Progressing Goal: Complications related to the disease process, condition or treatment will be avoided or minimized Outcome: Progressing   

## 2019-11-04 NOTE — Progress Notes (Signed)
Physical Therapy Treatment Patient Details Name: Alan Hawkins MRN: [id number] DOB: 1941/01/25 Today's Date: 11/04/2019    History of Present Illness Pt is a 79 y.o. male dx with COVID-19 on 10/15/19, admitted to ED 10/30/19 with SOB; found to have acute hypoxic respiratory failure. PMH includes renal transplant (on immunosuppresants), HF, CAD, CVA, AAA.   PT Comments    Pt progressing with mobility, although limited by increased fatigue this session. Pt able to transfer and ambulate with RW, intermittent min guard for balance, rest breaks as needed due to fatigue and DOE. Pt declining gait training without DME. Motivated to participate and regain PLOF. SpO2 >/90% on RA.    Follow Up Recommendations  No PT follow up;Supervision for mobility/OOB     Equipment Recommendations  Rolling walker with 5" wheels    Recommendations for Other Services       Precautions / Restrictions Precautions Precautions: Fall Restrictions Weight Bearing Restrictions: No    Mobility  Bed Mobility               General bed mobility comments: Pt received in recliner  Transfers Overall transfer level: Needs assistance Equipment used: Rolling walker (2 wheeled) Transfers: Sit to/from Stand Sit to Stand: Supervision         General transfer comment: Pt requesting use of RW. Repeated cues for correct hand placement; no physical assist required  Ambulation/Gait Ambulation/Gait assistance: Supervision;Min guard Gait Distance (Feet): 140 Feet Assistive device: Rolling walker (2 wheeled) Gait Pattern/deviations: Step-through pattern;Decreased stride length;Trunk flexed   Gait velocity interpretation: 1.31 - 2.62 ft/sec, indicative of limited community ambulator General Gait Details: Ambulated multiple laps in room with RW, intermittent min guard for balance (primarily with turns); 1x standing rest break secondary to fatigue and DOE. SpO2 maintaining >/90% on RA (good pleth once new probe applied  to R ear); required additional seated rest break before completing ADL tasks at sink. Pt declining gait training without DME   Stairs             Wheelchair Mobility    Modified Rankin (Stroke Patients Only)       Balance Overall balance assessment: Needs assistance   Sitting balance-Leahy Scale: Good       Standing balance-Leahy Scale: Fair Standing balance comment: Can static stand and take steps without UE support                            Cognition Arousal/Alertness: Awake/alert Behavior During Therapy: WFL for tasks assessed/performed Overall Cognitive Status: Within Functional Limits for tasks assessed                                 General Comments: Following simple commands and maintaining conversation      Exercises General Exercises - Lower Extremity Long Arc Quad: AROM;Both;Seated Hip Flexion/Marching: AROM;Both;Seated Other Exercises Other Exercises: Flutter valve x5, IS x5    General Comments General comments (skin integrity, edema, etc.): SpO2 94-87% on RA with mobility. Placed on new O2 monitor and pt maintained SpO2 in 90s throughout.      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, contracting  Prior Function Level of Independence: Independent          PT Goals (current goals can now be found in the care plan section) Acute Rehab PT Goals Patient Stated Goal: to go home Progress towards PT goals: Progressing toward goals    Frequency    Min 3X/week      PT Plan Current plan remains appropriate    Co-evaluation PT/OT/SLP Co-Evaluation/Treatment: Yes Reason for Co-Treatment: To address functional/ADL  transfers;Other (comment)(fatigue/activity tolerance) PT goals addressed during session: Mobility/safety with mobility;Balance;Proper use of DME;Strengthening/ROM OT goals addressed during session: ADL's and self-care      AM-PAC PT "6 Clicks" Mobility   Outcome Measure  Help needed turning from your back to your side while in a flat bed without using bedrails?: None Help needed moving from lying on your back to sitting on the side of a flat bed without using bedrails?: None Help needed moving to and from a bed to a chair (including a wheelchair)?: A Little Help needed standing up from a chair using your arms (e.g., wheelchair or bedside chair)?: A Little Help needed to walk in hospital room?: A Little Help needed climbing 3-5 steps with a railing? : A Little 6 Click Score: 20    End of Session   Activity Tolerance: Patient tolerated treatment well;Patient limited by fatigue Patient left: in chair;with call bell/phone within reach Nurse Communication: Mobility status PT Visit Diagnosis: Unsteadiness on feet (R26.81);Muscle weakness (generalized) (M62.81)     Time: 1117-3567 PT Time Calculation (min) (ACUTE ONLY): 24 min  Charges:  $Therapeutic Exercise: 8-22 mins                     Mabeline Caras, PT, DPT Acute Rehabilitation Services  Pager [phone] Office [place] 11/04/2019, 4:57 PM

## 2019-11-05 DIAGNOSIS — N400 Enlarged prostate without lower urinary tract symptoms: Secondary | ICD-10-CM

## 2019-11-05 LAB — COMPREHENSIVE METABOLIC PANEL
ALT: 64 U/L — ABNORMAL HIGH (ref 0–44)
AST: 36 U/L (ref 15–41)
Albumin: 2.8 g/dL — ABNORMAL LOW (ref 3.5–5.0)
Alkaline Phosphatase: 74 U/L (ref 38–126)
Anion gap: 11 (ref 5–15)
BUN: 87 mg/dL — ABNORMAL HIGH (ref 8–23)
CO2: 23 mmol/L (ref 22–32)
Calcium: 9.3 mg/dL (ref 8.9–10.3)
Chloride: 103 mmol/L (ref 98–111)
Creatinine, Ser: 2.26 mg/dL — ABNORMAL HIGH (ref 0.61–1.24)
GFR calc Af Amer: 31 mL/min — ABNORMAL LOW (ref >60)
GFR calc non Af Amer: 27 mL/min — ABNORMAL LOW (ref >60)
Glucose, Bld: 150 mg/dL — ABNORMAL HIGH (ref 70–99)
Potassium: 3.9 mmol/L (ref 3.5–5.1)
Sodium: 137 mmol/L (ref 135–145)
Total Bilirubin: 1.1 mg/dL (ref 0.3–1.2)
Total Protein: 5.9 g/dL — ABNORMAL LOW (ref 6.5–8.1)

## 2019-11-05 LAB — C-REACTIVE PROTEIN: CRP: 7.9 mg/dL — ABNORMAL HIGH (ref <1.0)

## 2019-11-05 LAB — CBC
HCT: 34.7 % — ABNORMAL LOW (ref 39.0–52.0)
Hemoglobin: 11 g/dL — ABNORMAL LOW (ref 13.0–17.0)
MCH: 30 pg (ref 26.0–34.0)
MCHC: 31.7 g/dL (ref 30.0–36.0)
MCV: 94.6 fL (ref 80.0–100.0)
Platelets: 251 10*3/uL (ref 150–400)
RBC: 3.67 MIL/uL — ABNORMAL LOW (ref 4.22–5.81)
RDW: 13.5 % (ref 11.5–15.5)
WBC: 7.2 10*3/uL (ref 4.0–10.5)
nRBC: 0 % (ref 0.0–0.2)

## 2019-11-05 LAB — FERRITIN: Ferritin: 711 ng/mL — ABNORMAL HIGH (ref 24–336)

## 2019-11-05 LAB — D-DIMER, QUANTITATIVE: D-Dimer, Quant: 1.61 ug/mL-FEU — ABNORMAL HIGH (ref 0.00–0.50)

## 2019-11-05 MED ORDER — GUAIFENESIN ER 600 MG PO TB12: 1200.0000 mg | ORAL_TABLET | Freq: Two times a day (BID) | ORAL | 0 refills | Status: AC

## 2019-11-05 MED ORDER — PREDNISONE 5 MG PO TABS: ORAL_TABLET | ORAL | 0 refills | Status: AC

## 2019-11-05 MED ORDER — HYDROCOD POLST-CPM POLST ER 10-8 MG/5ML PO SUER: 5.0000 mL | Freq: Two times a day (BID) | ORAL | 0 refills | Status: AC | PRN

## 2019-11-05 MED ORDER — TACROLIMUS 0.5 MG PO CAPS: 3.0000 mg | ORAL_CAPSULE | Freq: Two times a day (BID) | ORAL | Status: AC

## 2019-11-05 NOTE — Discharge Instructions (Signed)
Person Under Monitoring Name: Alan Hawkins  Location: [address] [place] [address]   Infection Prevention Recommendations for Individuals Confirmed to have, or Being Evaluated for, 2019 Novel Coronavirus (COVID-19) Infection Who Receive Care at Home  Individuals who are confirmed to have, or are being evaluated for, COVID-19 should follow the prevention steps below until a healthcare provider or local or state health department says they can return to normal activities.  Stay home except to get medical care You should restrict activities outside your home, except for getting medical care. Do not go to work, school, or public areas, and do not use public transportation or taxis.  Call ahead before visiting your doctor Before your medical appointment, call the healthcare provider and tell them that you have, or are being evaluated for, COVID-19 infection. This will help the healthcare provider's office take steps to keep other people from getting infected. Ask your healthcare provider to call the local or state health department.  Monitor your symptoms Seek prompt medical attention if your illness is worsening (e.g., difficulty breathing). Before going to your medical appointment, call the healthcare provider and tell them that you have, or are being evaluated for, COVID-19 infection. Ask your healthcare provider to call the local or state health department.  Wear a facemask You should wear a facemask that covers your nose and mouth when you are in the same room with other people and when you visit a healthcare provider. People who live with or visit you should also wear a facemask while they are in the same room with you.  Separate yourself from other people in your home As much as possible, you should stay in a different room from other people in your home. Also, you should use a separate bathroom, if available.  Avoid sharing household items You should not share  dishes, drinking glasses, cups, eating utensils, towels, bedding, or other items with other people in your home. After using these items, you should wash them thoroughly with soap and water.  Cover your coughs and sneezes Cover your mouth and nose with a tissue when you cough or sneeze, or you can cough or sneeze into your sleeve. Throw used tissues in a lined trash can, and immediately wash your hands with soap and water for at least 20 seconds or use an alcohol-based hand rub.  Wash your [facility] your hands often and thoroughly with soap and water for at least 20 seconds. You can use an alcohol-based hand sanitizer if soap and water are not available and if your hands are not visibly dirty. Avoid touching your eyes, nose, and mouth with unwashed hands.   Prevention Steps for Caregivers and Household Members of Individuals Confirmed to have, or Being Evaluated for, COVID-19 Infection Being Cared for in the Home  If you live with, or provide care at home for, a person confirmed to have, or being evaluated for, COVID-19 infection please follow these guidelines to prevent infection:  Follow healthcare provider's instructions Make sure that you understand and can help the patient follow any healthcare provider instructions for all care.  Provide for the patient's basic needs You should help the patient with basic needs in the home and provide support for getting groceries, prescriptions, and other personal needs.  Monitor the patient's symptoms If they are getting sicker, call his or her medical provider and tell them that the patient has, or is being evaluated for, COVID-19 infection. This will help the healthcare provider's office  take steps to keep other people from getting infected. Ask the healthcare provider to call the local or state health department.  Limit the number of people who have contact with the patient  If possible, have only one caregiver for the patient.  Other  household members should stay in another home or place of residence. If this is not possible, they should stay  in another room, or be separated from the patient as much as possible. Use a separate bathroom, if available.  Restrict visitors who do not have an essential need to be in the home.  Keep older adults, very young children, and other sick people away from the patient Keep older adults, very young children, and those who have compromised immune systems or chronic health conditions away from the patient. This includes people with chronic heart, lung, or kidney conditions, diabetes, and cancer.  Ensure good ventilation Make sure that shared spaces in the home have good air flow, such as from an air conditioner or an opened window, weather permitting.  Wash your hands often  Wash your hands often and thoroughly with soap and water for at least 20 seconds. You can use an alcohol based hand sanitizer if soap and water are not available and if your hands are not visibly dirty.  Avoid touching your eyes, nose, and mouth with unwashed hands.  Use disposable paper towels to dry your hands. If not available, use dedicated cloth towels and replace them when they become wet.  Wear a facemask and gloves  Wear a disposable facemask at all times in the room and gloves when you touch or have contact with the patient's blood, body fluids, and/or secretions or excretions, such as sweat, saliva, sputum, nasal mucus, vomit, urine, or feces.  Ensure the mask fits over your nose and mouth tightly, and do not touch it during use.  Throw out disposable facemasks and gloves after using them. Do not reuse.  Wash your hands immediately after removing your facemask and gloves.  If your personal clothing becomes contaminated, carefully remove clothing and launder. Wash your hands after handling contaminated clothing.  Place all used disposable facemasks, gloves, and other waste in a lined container before  disposing them with other household waste.  Remove gloves and wash your hands immediately after handling these items.  Do not share dishes, glasses, or other household items with the patient  Avoid sharing household items. You should not share dishes, drinking glasses, cups, eating utensils, towels, bedding, or other items with a patient who is confirmed to have, or being evaluated for, COVID-19 infection.  After the person uses these items, you should wash them thoroughly with soap and water.  Wash laundry thoroughly  Immediately remove and wash clothes or bedding that have blood, body fluids, and/or secretions or excretions, such as sweat, saliva, sputum, nasal mucus, vomit, urine, or feces, on them.  Wear gloves when handling laundry from the patient.  Read and follow directions on labels of laundry or clothing items and detergent. In general, wash and dry with the warmest temperatures recommended on the label.  Clean all areas the individual has used often  Clean all touchable surfaces, such as counters, tabletops, doorknobs, bathroom fixtures, toilets, phones, keyboards, tablets, and bedside tables, every day. Also, clean any surfaces that may have blood, body fluids, and/or secretions or excretions on them.  Wear gloves when cleaning surfaces the patient has come in contact with.  Use a diluted bleach solution (e.g., dilute bleach with 1 part  bleach and 10 parts water) or a household disinfectant with a label that says EPA-registered for coronaviruses. To make a bleach solution at home, add 1 tablespoon of bleach to 1 quart (4 cups) of water. For a larger supply, add  cup of bleach to 1 gallon (16 cups) of water.  Read labels of cleaning products and follow recommendations provided on product labels. Labels contain instructions for safe and effective use of the cleaning product including precautions you should take when applying the product, such as wearing gloves or eye protection  and making sure you have good ventilation during use of the product.  Remove gloves and wash hands immediately after cleaning.  Monitor yourself for signs and symptoms of illness Caregivers and household members are considered close contacts, should monitor their health, and will be asked to limit movement outside of the home to the extent possible. Follow the monitoring steps for close contacts listed on the symptom monitoring form.   ? If you have additional questions, contact your local health department or call the epidemiologist on call at [phone] (available 24/7). ? This guidance is subject to change. For the most up-to-date guidance from [facility], please refer to their website: [email or web address]

## 2019-11-05 NOTE — Progress Notes (Signed)
Feels better today-chest congestion has significantly cleared-remains on room air-creatinine is relatively stable.  Suspect stable to be discharged home-see discharge summary for details.

## 2019-11-05 NOTE — Discharge Summary (Signed)
PATIENT DETAILS Name: Alan Hawkins Age: 79 y.o. Sex: male Date of Birth: 30-Oct-1940 MRN: [id number]. Admitting Physician: Lucious Groves, DO QTM:[id number], [name], MD  Admit Date: 10/30/2019 Discharge date: 11/05/2019  Recommendations for Outpatient Follow-up:  1. Follow up with PCP in 1-2 weeks 2. Please obtain BMP/CBC in one week 3. Please follow tacrolimus level-pending at the time of discharge 4. Please ensure follow-up with nephrology.  Admitted From:  Home  Disposition: [place]:  Yes  Equipment/Devices: None  Discharge Condition: Stable  CODE STATUS: FULL CODE  Diet recommendation:  Diet Order            Diet - low sodium heart healthy        Diet renal with fluid restriction Room service appropriate? Yes; Fluid consistency: Thin  Diet effective now               Brief Summary: Patient is a 79 y.o. 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  Brief Hospital Course: Acute Hypoxic Resp Failure due to Covid 19 Viral pneumonia:  Treated with steroids and remdesivir-much improved-and now on room air.  Although he remains on room air-he was having significant chest congestion/URI issues-and was observed for 2 days-he was provided supportive care with Mucinex, bronchodilators and pulmonary toileting.  This morning-he feels much better-his chest congestion has completely resolved-his lungs are completely clear to auscultation.  He is stable to be discharged home with close outpatient follow-up with his outpatient physicians.  He will continue on tapering prednisone-and gradually be brought back to his usual home regimen-he will continue with as needed bronchodilators-Mucinex for the next few days.  PCP to repeat chest x-ray in 4  to 6 weeks to ensure clearance.   Note-although significantly improved-and not hypoxic (on room air) continues to have cough.  Patient is a renal transplant patient-and on multiple immunosuppressive's.  Case was discussed with Dr. Jerilynn Som disease-recommendations are to continue with isolation for 2 additional weeks from 1/26 (last positive Covid test was on 1/5)  COVID-19 Labs  Recent Labs    11/03/19 0301 11/04/19 0239 11/05/19 0254  DDIMER 2.23* 1.63* 1.61*  FERRITIN 554* 654* 711*  CRP 3.5* 8.3* 7.9*    Lab Results  Component Value Date   SARSCOV2NAA Detected (A) 10/15/2019    AKI on CKD stage IV-history of renal transplantation: Creatinine slowly improving with just supportive care-likely hemodynamically mediated kidney injury in setting of COVID-19 infection.  Renal transplant ultrasound without any hydronephrosis.  UA without proteinuria.  Case was discussed with Dr. Barbaraann Boys on-call. Tacrolimus dosage decreased to 3 mg twice daily-tacrolimus levels pending-please follow and continue to adjust dosage in the outpatient setting.  I have asked patient-and granddaughter (spoke on the phone this morning) to make an appointment with the primary nephrologist as soon as possible.  Hyperkalemia:  resolved-no need for [facility] on discharge.  Chronic diastolic heart failure: Euvolemic today-no edema-orthopnea better.  No need for Lasix.  History of CAD: No anginal symptoms-continue aspirin.  HTN: Controlled-continue amlodipine, clonidine, labetalol.  Follow and optimize.  BPH: Stable-continue Flomax and finasteride  Obesity: Estimated body mass index is 26.58 kg/m as calculated from the following:   Height as of this encounter: 5\' 9"  (1.753 m).   Weight as of this encounter: 81.6 kg.  Procedures/Studies: None  Discharge Diagnoses:  Principal Problem:   Pneumonia due to COVID-19 virus   Discharge Instructions:  Activity:  As tolerated     Discharge Instructions    Call MD for:  difficulty breathing, headache or visual disturbances   Complete by: As directed    Call MD for:  extreme fatigue   Complete by: As directed    Call MD for:  persistant dizziness or light-headedness   Complete by: As directed    Call MD for:  persistant nausea and vomiting   Complete by: As directed    Diet - low sodium heart healthy   Complete by: As directed    Discharge instructions   Complete by: As directed    1.)  2 additional weeks of isolation from 1/26 (because of ongoing coughing spells-and your transplant history/immunocompromised status)  2.) please make an appointment with your primary nephrologist within 1 week-your tacrolimus levels are pending at the time of discharge and will need to be followed up.  Your tacrolimus dosage has been reduced to 3 mg twice a day  3.)  Please seek immediate medical attention if have worsening shortness of breath.  4.)  While at home-continue to use incentive spirometry/flutter valve at least 5-6 times an hour.   Follow with Primary MD  [name], [name], MD in 1week  Please get a complete blood count and chemistry panel checked by your Primary MD at your next visit, and again as instructed by your Primary MD.  Get Medicines reviewed and adjusted: Please take all your medications with you for your next visit with your Primary MD  Laboratory/radiological data: Please request your Primary MD to go over all hospital tests and procedure/radiological results at the follow up, please ask your Primary MD to get all Hospital records sent to his/her office.  In some cases, they will be blood work, cultures and biopsy results pending at the time of your discharge. Please request that your primary care M.D. follows up on these results.  Also Note the following: If you experience worsening of your admission symptoms, develop shortness of breath, life threatening emergency, suicidal or homicidal thoughts  you must seek medical attention immediately by calling 911 or calling your MD immediately  if symptoms less severe.  You must read complete instructions/literature along with all the possible adverse reactions/side effects for all the Medicines you take and that have been prescribed to you. Take any new Medicines after you have completely understood and accpet all the possible adverse reactions/side effects.   Do not drive when taking Pain medications or sleeping medications (Benzodaizepines)  Do not take more than prescribed Pain, Sleep and Anxiety Medications. It is not advisable to combine anxiety,sleep and pain medications without talking with your primary care practitioner  Special Instructions: If you have smoked or chewed Tobacco  in the last 2 yrs please stop smoking, stop any regular Alcohol  and or any Recreational drug use.  Wear Seat belts while driving.  Please note: You were cared for by a hospitalist during your hospital stay. Once you are discharged, your primary care physician will handle any further medical issues. Please note that NO REFILLS for any discharge medications will be authorized once you are discharged, as it is imperative that you return to your primary care physician (or establish a relationship with a primary care physician if you do not have one) for your post hospital discharge needs so that they can reassess your need for medications and monitor your  lab values.   Increase activity slowly   Complete by: As directed      Allergies as of 11/05/2019      Reactions   Bee Venom Anaphylaxis   Nitrofuran Derivatives Nausea And Vomiting   Nausea and vomiting , sever headache while on nitroglycerin infusion twice   Nitroglycerin Other (See Comments)   Headache   nausea Headache   nausea   Tape Itching, Rash      Medication List    STOP taking these medications   dextromethorphan-guaiFENesin 30-600 MG 12hr tablet Commonly known as: [place] DM     TAKE  these medications   acetaminophen 500 MG tablet Commonly known as: TYLENOL Take 500-1,000 mg by mouth every 6 (six) hours as needed for moderate pain or headache.   albuterol 108 (90 Base) MCG/ACT inhaler Commonly known as: VENTOLIN HFA Inhale 2 puffs into the lungs every 6 (six) hours as needed for wheezing or shortness of breath.   amLODipine 10 MG tablet Commonly known as: NORVASC Take 10 mg by mouth daily.   aspirin EC 81 MG tablet Take 2 tablets (162 mg total) by mouth daily. What changed:   how much to take  when to take this   cetirizine 10 MG tablet Commonly known as: ZYRTEC Take 10 mg by mouth at bedtime.   chlorpheniramine-HYDROcodone 10-8 MG/5ML Suer Commonly known as: TUSSIONEX Take 5 mLs by mouth every 12 (twelve) hours as needed for cough.   cloNIDine 0.1 MG tablet Commonly known as: CATAPRES Take 0.1 mg by mouth 3 (three) times daily.   docusate sodium 100 MG capsule Commonly known as: COLACE Take 100 mg by mouth 2 (two) times daily.   finasteride 5 MG tablet Commonly known as: PROSCAR Take 5 mg by mouth at bedtime.   fluticasone 50 MCG/ACT nasal spray Commonly known as: FLONASE Place 1 spray into both nostrils daily as needed.   guaiFENesin 600 MG 12 hr tablet Commonly known as: MUCINEX Take 2 tablets (1,200 mg total) by mouth 2 (two) times daily for 7 days.   labetalol 200 MG tablet Commonly known as: NORMODYNE Take 400 mg by mouth 2 (two) times daily.   MiraLax 17 g packet Generic drug: polyethylene glycol Take 17 g by mouth daily. Take 17 g by mouth daily as needed (Constipation).   mycophenolate 180 MG EC tablet Commonly known as: MYFORTIC Take 180 mg by mouth 2 (two) times daily.   predniSONE 5 MG tablet Commonly known as: DELTASONE Take 3 tablets (15 mg) daily for 2 days, then 2 tablets (10 mg) daily for 2 days, then resume 1 tablet (5 mg) daily as previous. What changed:   how much to take  how to take this  when to take  this  additional instructions   ranitidine 150 MG tablet Commonly known as: ZANTAC Take 150 mg by mouth 2 (two) times daily.   SYSTANE OP Apply 1 drop to eye daily as needed (dry eyes).   tacrolimus 0.5 MG capsule Commonly known as: PROGRAF Take 6 capsules (3 mg total) by mouth 2 (two) times daily. What changed: how much to take   tamsulosin 0.4 MG Caps capsule Commonly known as: FLOMAX Take 0.8 mg by mouth daily.            Durable Medical Equipment  (From admission, onward)         Start     Ordered   11/05/19 0808  For home use only DME Walker rolling  Once  Question Answer Comment  Walker: With [place]   Patient needs a walker to treat with the following condition Physical deconditioning      11/05/19 0807         Follow-up Information    [name], [name], MD. Schedule an appointment as soon as possible for a visit in 1 week(s).   Specialty: Nephrology Contact information: [address] [place] [address] [phone]          Allergies  Allergen Reactions  . Bee Venom Anaphylaxis  . Nitrofuran Derivatives Nausea And Vomiting    Nausea and vomiting , sever headache while on nitroglycerin infusion twice  . Nitroglycerin Other (See Comments)    Headache   nausea Headache   nausea  . Tape Itching and Rash    Consultations:   None   Other Procedures/Studies: US Renal Transplant w/Doppler  Result Date: 11/03/2019 CLINICAL DATA:  COVID positive, chronic heart failure, acute hypoxic respiratory failure, acute kidney injury, renal transplant EXAM: ULTRASOUND OF RENAL TRANSPLANT WITH RENAL DOPPLER ULTRASOUND TECHNIQUE: Ultrasound examination of the renal transplant was performed with gray-scale, color and duplex doppler evaluation. COMPARISON:  None. FINDINGS: Transplant kidney location: RLQ Transplant Kidney: Renal measurements: 10.5 x 5.4 x 4.8 cm = volume: 141 mL. Normal in size and parenchymal echogenicity. No evidence of mass or  hydronephrosis. No peri-transplant fluid collection seen. Color flow in the main renal artery:  Yes Color flow in the main renal vein:  Yes Duplex Doppler Evaluation: Main Renal Artery Resistive Index: 0.6 Venous waveform in main renal vein:  Present Intrarenal resistive index in upper pole:  0.54 (normal 0.6-0.8; equivocal 0.8-0.9; abnormal >= 0.9) Intrarenal resistive index in lower pole: 0.7 (normal 0.6-0.8; equivocal 0.8-0.9; abnormal >= 0.9) Bladder: Normal for degree of bladder distention. Other findings:  None. IMPRESSION: Right lower quadrant renal transplant without hydronephrosis. Normal resistive indices. Electronically Signed   By: Julian Hy M.D.   On: 11/03/2019 08:58   DG Chest Port 1 View  Result Date: 11/04/2019 CLINICAL DATA:  COVID-19 positive, shortness of breath. EXAM: PORTABLE CHEST 1 VIEW COMPARISON:  11/03/2019. FINDINGS: Trachea is midline. Heart is enlarged, stable. Thoracic aorta is calcified. Patchy bilateral airspace opacification, similar to yesterday's exam. There may be a small right pleural effusion. IMPRESSION: 1. Multilobar COVID-19 pneumonia, similar to 11/03/2019. 2. Small right pleural effusion. 3.  Aortic atherosclerosis (ICD10-I70.0). Electronically Signed   By: Lorin Picket M.D.   On: 11/04/2019 07:47   DG CHEST PORT 1 VIEW  Result Date: 11/03/2019 CLINICAL DATA:  COVID positive.  Shortness of breath. EXAM: PORTABLE CHEST 1 VIEW COMPARISON:  10/30/2019 FINDINGS: Patchy bilateral airspace disease is again noted, progressive in the left upper lobe and relatively similar in the right upper lobe and both lung bases. The cardio pericardial silhouette is enlarged. No substantial pleural effusion although there is probably a tiny right sided effusion. The visualized bony structures of the thorax are intact. Telemetry leads overlie the chest. IMPRESSION: Some progression of patchy airspace disease in the left base with relatively similar patchy airspace opacity  elsewhere in the bilateral lungs. Electronically Signed   By: Misty Stanley M.D.   On: 11/03/2019 10:53   DG Chest Port 1 View  Result Date: 10/30/2019 CLINICAL DATA:  Worsening shortness of breath, history of COVID-19 positivity EXAM: PORTABLE CHEST 1 VIEW COMPARISON:  10/19/2019 FINDINGS: Cardiac shadow is stable. Patchy opacities are again identified throughout both lungs but increased particularly in the right base and right upper lobe  with some mild volume loss in the right upper lobe. These changes are consistent with the given clinical history. No bony abnormality is noted. IMPRESSION: Increasing opacities bilaterally particularly on the right consistent with the given clinical history. Electronically Signed   By: Inez Catalina M.D.   On: 10/30/2019 10:55   DG Chest Port 1 View  Result Date: 10/19/2019 CLINICAL DATA:  COVID-19 positive patient.  Worsening cough. EXAM: PORTABLE CHEST 1 VIEW COMPARISON:  August 04, 2014 FINDINGS: No pneumothorax. Suggested subtle peripheral patchy opacities. Cardiomegaly. Superimposed pulmonary venous congestion not excluded. No pneumothorax. IMPRESSION: 1. Subtle peripheral patchy opacities likely from the patient's COVID-19 status. 2. Cardiomegaly. Subtle pulmonary venous congestion is not excluded. Electronically Signed   By: Dorise Bullion III M.D   On: 10/19/2019 14:06   ECHOCARDIOGRAM LIMITED  Result Date: 10/30/2019   ECHOCARDIOGRAM LIMITED REPORT   Patient Name:   Alan Hawkins Date of Exam: 10/30/2019 Medical Rec #:  [id number]     Height:       69.0 in Accession #:    [id number]    Weight:       180.0 lb Date of Birth:  01-04-1941      BSA:          1.98 m Patient Age:    44 years      BP:           159/87 mmHg Patient Gender: M             HR:           97 bpm. Exam Location:  Inpatient  Procedure: Limited Echo, Limited Color Doppler and Cardiac Doppler Indications:    congestive heart failure 428.0  History:        Patient has prior history of  Echocardiogram examinations, most                 recent 08/22/2016.  Sonographer:    Johny Chess Referring Phys: [id number] ERIK C HOFFMAN IMPRESSIONS  1. Left ventricular ejection fraction, by visual estimation, is 45 to 50%. The left ventricle has mildly decreased function. There is mildly increased left ventricular wall thickness.  2. Global right ventricle has normal systolc function.The right ventricular size is normal. no increase in right ventricular wall thickness.  3. Mild mitral annular calcification.  4. The mitral valve is normal in structure. Mild mitral valve regurgitation. No evidence of mitral stenosis.  5. The tricuspid valve was normal in structure. Tricuspid valve regurgitation is mild.  6. Tricuspid valve regurgitation is mild.  7. Mild to moderate aortic valve sclerosis/calcification without any evidence of aortic stenosis.  8. The pulmonic valve was normal in structure.  9. Moderately elevated pulmonary artery systolic pressure. 10. The inferior vena cava is normal in size with greater than 50% respiratory variability, suggesting right atrial pressure of 3 mmHg. 11. Mild mitral anterior leaflet prolapse. 12. Entire apex is abnormal. 13. Moderate pleural effusion in the left lateral region. FINDINGS  Left Ventricle: Left ventricular ejection fraction, by visual estimation, is 45 to 50%. The left ventricle has mildly decreased function. There is mildly increased left ventricular wall thickness. Normal left atrial pressure.  LV Wall Scoring: The entire apex is akinetic. Right Ventricle: The right ventricular size is normal. No increase in right ventricular wall thickness. Global RV systolic function is has normal systolic function. The tricuspid regurgitant velocity is 3.49 m/s, and with an assumed right atrial pressure  of 3 mmHg, the estimated right ventricular  systolic pressure is moderately elevated at 51.7 mmHg. Left Atrium: Left atrial size was normal in size. Right Atrium: Right atrial  size was normal in size. Right atrial pressure is estimated at 3 mmHg. Pericardium: There is no evidence of pericardial effusion is seen. There is no evidence of pericardial effusion. There is a moderate pleural effusion in the left lateral region. Mitral Valve: The mitral valve is normal in structure. There is mild thickening of the mitral valve leaflet(s). Mild mitral annular calcification. No evidence of mitral valve stenosis by observation. Mild mitral valve regurgitation. Mild mitral anterior leaflet prolapse. Tricuspid Valve: The tricuspid valve is normal in structure. Tricuspid valve regurgitation is mild. Aortic Valve: The aortic valve is normal in structure. Aortic valve regurgitation is mild. Mild to moderate aortic valve sclerosis/calcification is present, without any evidence of aortic stenosis. Pulmonic Valve: The pulmonic valve was normal in structure. Pulmonic valve regurgitation is not visualized by color flow Doppler. Pulmonic regurgitation is not visualized by color flow Doppler. Aorta: The aortic root, ascending aorta and aortic arch are all structurally normal, with no evidence of dilitation or obstruction. Venous: The inferior vena cava is normal in size with greater than 50% respiratory variability, suggesting right atrial pressure of 3 mmHg. Shunts: There is no evidence of a patent foramen ovale. No ventricular septal defect is seen or detected. There is no evidence of an atrial septal defect. No atrial level shunt detected by color flow Doppler.  LEFT VENTRICLE          Normals PLAX 2D LVIDd:         5.20 cm  3.6 cm LVIDs:         2.90 cm  1.7 cm LV PW:         1.40 cm  1.4 cm LV IVS:        1.40 cm  1.3 cm LVOT diam:     2.60 cm  2.0 cm LV SV:         97 ml    79 ml LV SV Index:   48.47    45 ml/m2 LVOT Area:     5.31 cm 3.14 cm2  LEFT ATRIUM         Index LA diam:    5.40 cm 2.73 cm/m   AORTA                 Normals Ao Root diam: 3.50 cm 31 mm Ao Asc diam:  3.40 cm 31 mm TRICUSPID VALVE              Normals TR Peak grad:   48.7 mmHg TR Vmax:        349.00 cm/s 288 cm/s  SHUNTS Systemic Diam: 2.60 cm  Candee Furbish MD Electronically signed by Candee Furbish MD Signature Date/Time: 10/30/2019/4:40:25 PMThe mitral valve is normal in structure.    Final      TODAY-DAY OF DISCHARGE:  Subjective:   Merdith Boyd today has no headache,no chest abdominal pain,no new weakness tingling or numbness, feels much better wants to go home today.   Objective:   Blood pressure 137/78, pulse 85, temperature (!) 97 F (36.1 C), temperature source Oral, resp. rate (!) 22, height 5\' 9"  (1.753 m), weight 81.6 kg, SpO2 100 %.  Intake/Output Summary (Last 24 hours) at 11/05/2019 1059 Last data filed at 11/05/2019 0100 Gross per 24 hour  Intake 100 ml  Output 450 ml  Net -350 ml   Filed Weights   10/30/19 6812  Weight: 81.6 kg    Exam: Awake Alert, Oriented *3, No new F.N deficits, Normal affect [place].AT,PERRAL Supple Neck,No JVD, No cervical lymphadenopathy appriciated.  Symmetrical Chest wall movement, Good air movement bilaterally, CTAB RRR,No Gallops,Rubs or new Murmurs, No Parasternal Heave +ve B.Sounds, Abd Soft, Non tender, No organomegaly appriciated, No rebound -guarding or rigidity. No Cyanosis, Clubbing or edema, No new Rash or bruise   PERTINENT RADIOLOGIC STUDIES: US Renal Transplant w/Doppler  Result Date: 11/03/2019 CLINICAL DATA:  COVID positive, chronic heart failure, acute hypoxic respiratory failure, acute kidney injury, renal transplant EXAM: ULTRASOUND OF RENAL TRANSPLANT WITH RENAL DOPPLER ULTRASOUND TECHNIQUE: Ultrasound examination of the renal transplant was performed with gray-scale, color and duplex doppler evaluation. COMPARISON:  None. FINDINGS: Transplant kidney location: RLQ Transplant Kidney: Renal measurements: 10.5 x 5.4 x 4.8 cm = volume: 141 mL. Normal in size and parenchymal echogenicity. No evidence of mass or hydronephrosis. No peri-transplant fluid collection  seen. Color flow in the main renal artery:  Yes Color flow in the main renal vein:  Yes Duplex Doppler Evaluation: Main Renal Artery Resistive Index: 0.6 Venous waveform in main renal vein:  Present Intrarenal resistive index in upper pole:  0.54 (normal 0.6-0.8; equivocal 0.8-0.9; abnormal >= 0.9) Intrarenal resistive index in lower pole: 0.7 (normal 0.6-0.8; equivocal 0.8-0.9; abnormal >= 0.9) Bladder: Normal for degree of bladder distention. Other findings:  None. IMPRESSION: Right lower quadrant renal transplant without hydronephrosis. Normal resistive indices. Electronically Signed   By: Julian Hy M.D.   On: 11/03/2019 08:58   DG Chest Port 1 View  Result Date: 11/04/2019 CLINICAL DATA:  COVID-19 positive, shortness of breath. EXAM: PORTABLE CHEST 1 VIEW COMPARISON:  11/03/2019. FINDINGS: Trachea is midline. Heart is enlarged, stable. Thoracic aorta is calcified. Patchy bilateral airspace opacification, similar to yesterday's exam. There may be a small right pleural effusion. IMPRESSION: 1. Multilobar COVID-19 pneumonia, similar to 11/03/2019. 2. Small right pleural effusion. 3.  Aortic atherosclerosis (ICD10-I70.0). Electronically Signed   By: Lorin Picket M.D.   On: 11/04/2019 07:47   DG CHEST PORT 1 VIEW  Result Date: 11/03/2019 CLINICAL DATA:  COVID positive.  Shortness of breath. EXAM: PORTABLE CHEST 1 VIEW COMPARISON:  10/30/2019 FINDINGS: Patchy bilateral airspace disease is again noted, progressive in the left upper lobe and relatively similar in the right upper lobe and both lung bases. The cardio pericardial silhouette is enlarged. No substantial pleural effusion although there is probably a tiny right sided effusion. The visualized bony structures of the thorax are intact. Telemetry leads overlie the chest. IMPRESSION: Some progression of patchy airspace disease in the left base with relatively similar patchy airspace opacity elsewhere in the bilateral lungs. Electronically Signed    By: Misty Stanley M.D.   On: 11/03/2019 10:53   DG Chest Port 1 View  Result Date: 10/30/2019 CLINICAL DATA:  Worsening shortness of breath, history of COVID-19 positivity EXAM: PORTABLE CHEST 1 VIEW COMPARISON:  10/19/2019 FINDINGS: Cardiac shadow is stable. Patchy opacities are again identified throughout both lungs but increased particularly in the right base and right upper lobe with some mild volume loss in the right upper lobe. These changes are consistent with the given clinical history. No bony abnormality is noted. IMPRESSION: Increasing opacities bilaterally particularly on the right consistent with the given clinical history. Electronically Signed   By: Inez Catalina M.D.   On: 10/30/2019 10:55   DG Chest Port 1 View  Result Date: 10/19/2019 CLINICAL DATA:  COVID-19 positive patient.  Worsening cough. EXAM:  PORTABLE CHEST 1 VIEW COMPARISON:  August 04, 2014 FINDINGS: No pneumothorax. Suggested subtle peripheral patchy opacities. Cardiomegaly. Superimposed pulmonary venous congestion not excluded. No pneumothorax. IMPRESSION: 1. Subtle peripheral patchy opacities likely from the patient's COVID-19 status. 2. Cardiomegaly. Subtle pulmonary venous congestion is not excluded. Electronically Signed   By: Dorise Bullion III M.D   On: 10/19/2019 14:06   ECHOCARDIOGRAM LIMITED  Result Date: 10/30/2019   ECHOCARDIOGRAM LIMITED REPORT   Patient Name:   Alan Hawkins Date of Exam: 10/30/2019 Medical Rec #:  [id number]     Height:       69.0 in Accession #:    [id number]    Weight:       180.0 lb Date of Birth:  Dec 18, 1940      BSA:          1.98 m Patient Age:    85 years      BP:           159/87 mmHg Patient Gender: M             HR:           97 bpm. Exam Location:  Inpatient  Procedure: Limited Echo, Limited Color Doppler and Cardiac Doppler Indications:    congestive heart failure 428.0  History:        Patient has prior history of Echocardiogram examinations, most                 recent 08/22/2016.   Sonographer:    Johny Chess Referring Phys: [id number] ERIK C HOFFMAN IMPRESSIONS  1. Left ventricular ejection fraction, by visual estimation, is 45 to 50%. The left ventricle has mildly decreased function. There is mildly increased left ventricular wall thickness.  2. Global right ventricle has normal systolc function.The right ventricular size is normal. no increase in right ventricular wall thickness.  3. Mild mitral annular calcification.  4. The mitral valve is normal in structure. Mild mitral valve regurgitation. No evidence of mitral stenosis.  5. The tricuspid valve was normal in structure. Tricuspid valve regurgitation is mild.  6. Tricuspid valve regurgitation is mild.  7. Mild to moderate aortic valve sclerosis/calcification without any evidence of aortic stenosis.  8. The pulmonic valve was normal in structure.  9. Moderately elevated pulmonary artery systolic pressure. 10. The inferior vena cava is normal in size with greater than 50% respiratory variability, suggesting right atrial pressure of 3 mmHg. 11. Mild mitral anterior leaflet prolapse. 12. Entire apex is abnormal. 13. Moderate pleural effusion in the left lateral region. FINDINGS  Left Ventricle: Left ventricular ejection fraction, by visual estimation, is 45 to 50%. The left ventricle has mildly decreased function. There is mildly increased left ventricular wall thickness. Normal left atrial pressure.  LV Wall Scoring: The entire apex is akinetic. Right Ventricle: The right ventricular size is normal. No increase in right ventricular wall thickness. Global RV systolic function is has normal systolic function. The tricuspid regurgitant velocity is 3.49 m/s, and with an assumed right atrial pressure  of 3 mmHg, the estimated right ventricular systolic pressure is moderately elevated at 51.7 mmHg. Left Atrium: Left atrial size was normal in size. Right Atrium: Right atrial size was normal in size. Right atrial pressure is estimated at 3 mmHg.  Pericardium: There is no evidence of pericardial effusion is seen. There is no evidence of pericardial effusion. There is a moderate pleural effusion in the left lateral region. Mitral Valve: The mitral valve is normal in structure. There  is mild thickening of the mitral valve leaflet(s). Mild mitral annular calcification. No evidence of mitral valve stenosis by observation. Mild mitral valve regurgitation. Mild mitral anterior leaflet prolapse. Tricuspid Valve: The tricuspid valve is normal in structure. Tricuspid valve regurgitation is mild. Aortic Valve: The aortic valve is normal in structure. Aortic valve regurgitation is mild. Mild to moderate aortic valve sclerosis/calcification is present, without any evidence of aortic stenosis. Pulmonic Valve: The pulmonic valve was normal in structure. Pulmonic valve regurgitation is not visualized by color flow Doppler. Pulmonic regurgitation is not visualized by color flow Doppler. Aorta: The aortic root, ascending aorta and aortic arch are all structurally normal, with no evidence of dilitation or obstruction. Venous: The inferior vena cava is normal in size with greater than 50% respiratory variability, suggesting right atrial pressure of 3 mmHg. Shunts: There is no evidence of a patent foramen ovale. No ventricular septal defect is seen or detected. There is no evidence of an atrial septal defect. No atrial level shunt detected by color flow Doppler.  LEFT VENTRICLE          Normals PLAX 2D LVIDd:         5.20 cm  3.6 cm LVIDs:         2.90 cm  1.7 cm LV PW:         1.40 cm  1.4 cm LV IVS:        1.40 cm  1.3 cm LVOT diam:     2.60 cm  2.0 cm LV SV:         97 ml    79 ml LV SV Index:   48.47    45 ml/m2 LVOT Area:     5.31 cm 3.14 cm2  LEFT ATRIUM         Index LA diam:    5.40 cm 2.73 cm/m   AORTA                 Normals Ao Root diam: 3.50 cm 31 mm Ao Asc diam:  3.40 cm 31 mm TRICUSPID VALVE             Normals TR Peak grad:   48.7 mmHg TR Vmax:        349.00  cm/s 288 cm/s  SHUNTS Systemic Diam: 2.60 cm  Candee Furbish MD Electronically signed by Candee Furbish MD Signature Date/Time: 10/30/2019/4:40:25 PMThe mitral valve is normal in structure.    Final      PERTINENT LAB RESULTS: CBC: Recent Labs    11/04/19 0239 11/05/19 0254  WBC 6.6 7.2  HGB 11.0* 11.0*  HCT 34.6* 34.7*  PLT 267 251   CMET CMP     Component Value Date/Time   NA 137 11/05/2019 0254   K 3.9 11/05/2019 0254   CL 103 11/05/2019 0254   CO2 23 11/05/2019 0254   GLUCOSE 150 (H) 11/05/2019 0254   BUN 87 (H) 11/05/2019 0254   CREATININE 2.26 (H) 11/05/2019 0254   CALCIUM 9.3 11/05/2019 0254   CALCIUM 8.6 09/21/2013 1243   PROT 5.9 (L) 11/05/2019 0254   ALBUMIN 2.8 (L) 11/05/2019 0254   AST 36 11/05/2019 0254   ALT 64 (H) 11/05/2019 0254   ALKPHOS 74 11/05/2019 0254   BILITOT 1.1 11/05/2019 0254   GFRNONAA 27 (L) 11/05/2019 0254   GFRAA 31 (L) 11/05/2019 0254    GFR Estimated Creatinine Clearance: 26.9 mL/min (A) (by C-G formula based on SCr of 2.26 mg/dL (H)). No results for input(s): LIPASE,  AMYLASE in the last 72 hours. No results for input(s): CKTOTAL, CKMB, CKMBINDEX, TROPONINI in the last 72 hours. Invalid input(s): POCBNP Recent Labs    11/04/19 0239 11/05/19 0254  DDIMER 1.63* 1.61*   No results for input(s): HGBA1C in the last 72 hours. No results for input(s): CHOL, HDL, LDLCALC, TRIG, CHOLHDL, LDLDIRECT in the last 72 hours. No results for input(s): TSH, T4TOTAL, T3FREE, THYROIDAB in the last 72 hours.  Invalid input(s): FREET3 Recent Labs    11/04/19 0239 11/05/19 0254  FERRITIN 654* 711*   Coags: No results for input(s): INR in the last 72 hours.  Invalid input(s): PT Microbiology: No results found for this or any previous visit (from the past 240 hour(s)).  FURTHER DISCHARGE INSTRUCTIONS:  Get Medicines reviewed and adjusted: Please take all your medications with you for your next visit with your Primary MD  Laboratory/radiological  data: Please request your Primary MD to go over all hospital tests and procedure/radiological results at the follow up, please ask your Primary MD to get all Hospital records sent to his/her office.  In some cases, they will be blood work, cultures and biopsy results pending at the time of your discharge. Please request that your primary care M.D. goes through all the records of your hospital data and follows up on these results.  Also Note the following: If you experience worsening of your admission symptoms, develop shortness of breath, life threatening emergency, suicidal or homicidal thoughts you must seek medical attention immediately by calling 911 or calling your MD immediately  if symptoms less severe.  You must read complete instructions/literature along with all the possible adverse reactions/side effects for all the Medicines you take and that have been prescribed to you. Take any new Medicines after you have completely understood and accpet all the possible adverse reactions/side effects.   Do not drive when taking Pain medications or sleeping medications (Benzodaizepines)  Do not take more than prescribed Pain, Sleep and Anxiety Medications. It is not advisable to combine anxiety,sleep and pain medications without talking with your primary care practitioner  Special Instructions: If you have smoked or chewed Tobacco  in the last 2 yrs please stop smoking, stop any regular Alcohol  and or any Recreational drug use.  Wear Seat belts while driving.  Please note: You were cared for by a hospitalist during your hospital stay. Once you are discharged, your primary care physician will handle any further medical issues. Please note that NO REFILLS for any discharge medications will be authorized once you are discharged, as it is imperative that you return to your primary care physician (or establish a relationship with a primary care physician if you do not have one) for your post hospital  discharge needs so that they can reassess your need for medications and monitor your lab values.  Total Time spent coordinating discharge including counseling, education and face to face time equals35 minutes.  SignedOren Binet 11/05/2019 10:59 AM

## 2019-11-05 NOTE — TOC Transition Note (Signed)
Transition of Care [facility]) - CM/SW Discharge Note   Patient Details  Name: Alan Hawkins MRN: [id number] Date of Birth: Sep 05, 1941  [facility] CM/SW Contact:  Shade Flood, LCSW Phone Number: 11/05/2019, 11:10 AM   Clinical Narrative:     Pt stable for dc per MD. Pt will be returning home. Only dc need is for a [employer]. Ordered through [employer] and asked AC to please bring to pt's room from the [facility].   There are no other TOC needs identified for dc.  Final next level of care: Home/Self Care Barriers to Discharge: Barriers Resolved   Patient Goals and CMS Choice        Discharge Placement                       Discharge Plan and Services                DME Arranged: Walker rolling DME Agency: [employer] Date DME Agency Contacted: 11/05/19   Representative spoke with at DME Agency: [place] [facility] Interventions     Readmission Risk Interventions No flowsheet data found.

## 2019-11-06 LAB — TACROLIMUS LEVEL: Tacrolimus (FK506) - LabCorp: 23.6 ng/mL — ABNORMAL HIGH (ref 2.0–20.0)

## 2019-11-11 DEATH — deceased

## 2020-03-27 IMAGING — DX DG CHEST 1V PORT
1 series · 1 of 1 positions shown · non-contrast
Comparison: 10/19/2019

CLINICAL DATA: Worsening shortness of breath, history of FOFF5-DQ
positivity

EXAM:
PORTABLE CHEST 1 VIEW

[chest ap]
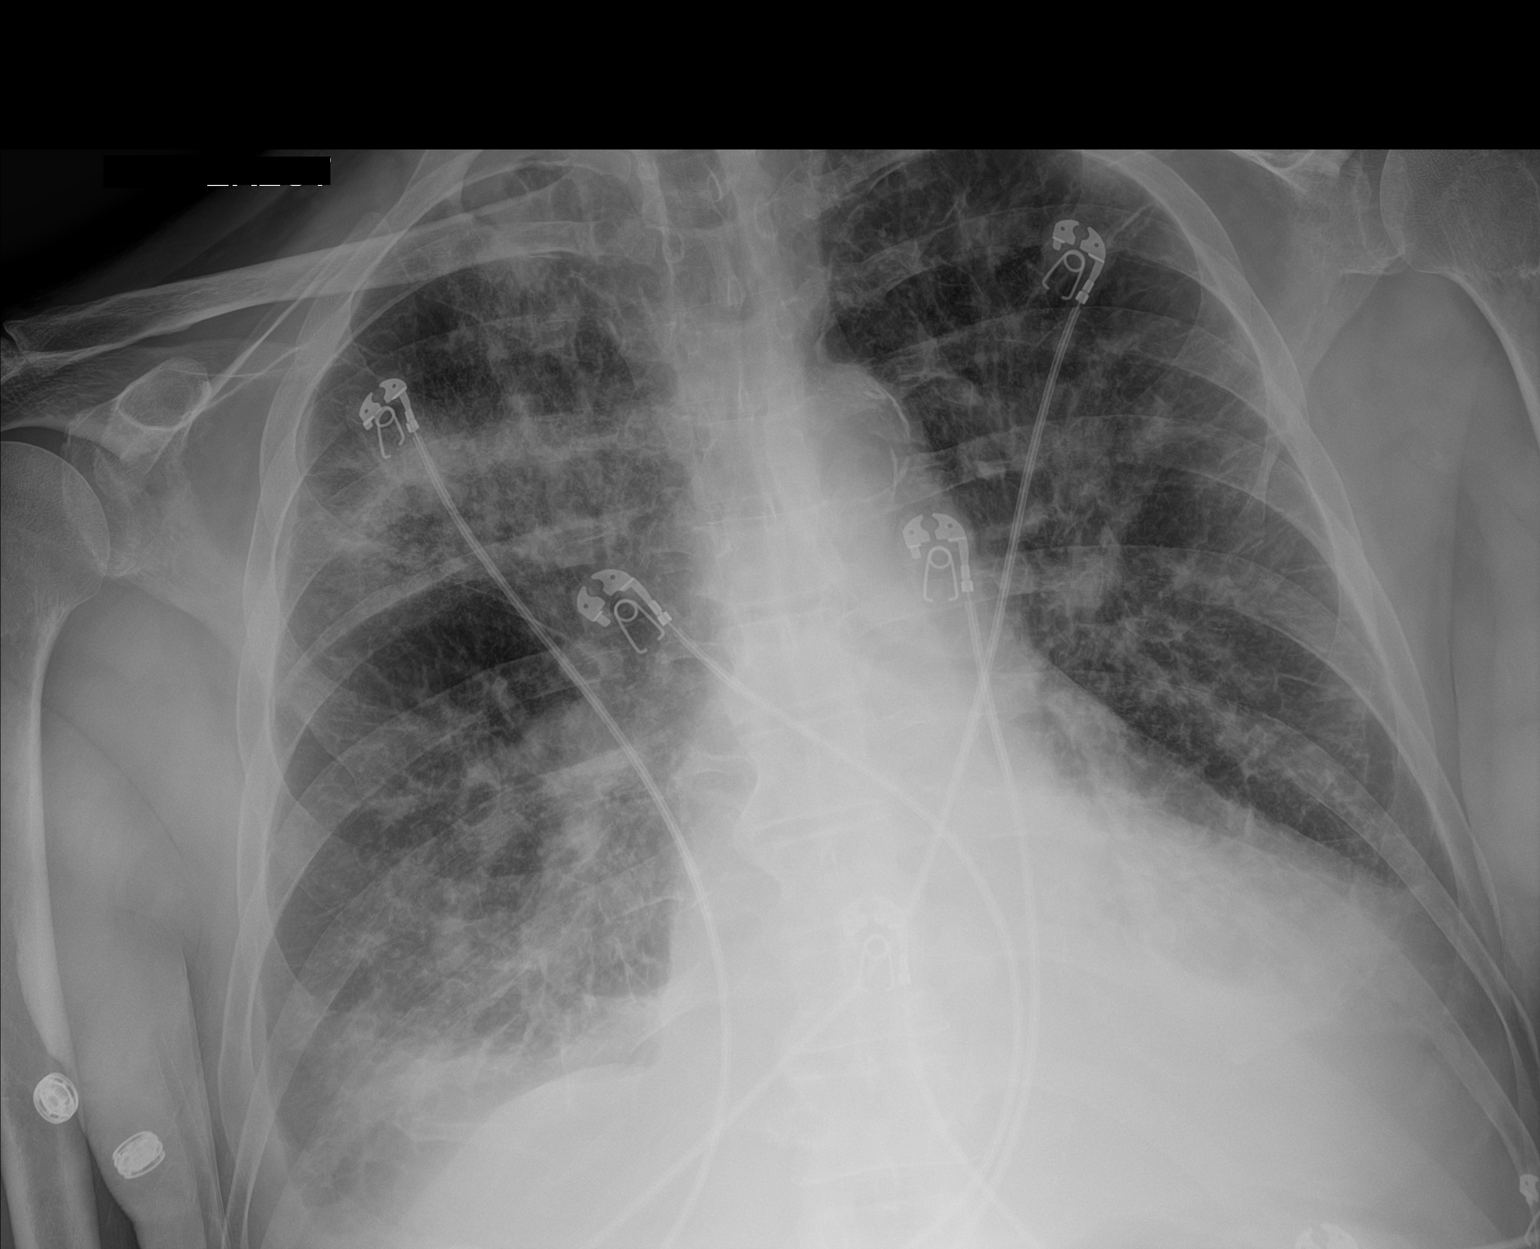

[1 of 1 positions shown; findings below may reference images not displayed]

FINDINGS: Cardiac shadow is stable. Patchy opacities are again identified
throughout both lungs but increased particularly in the right base
and right upper lobe with some mild volume loss in the right upper
lobe. These changes are consistent with the given clinical history.
No bony abnormality is noted.
IMPRESSION: Increasing opacities bilaterally particularly on the right
consistent with the given clinical history.

## 2021-05-04 ENCOUNTER — Encounter (INDEPENDENT_AMBULATORY_CARE_PROVIDER_SITE_OTHER): Payer: Self-pay | Admitting: *Deleted
